# Patient Record
Sex: Female | Born: 1955
Health system: Southern US, Community
[De-identification: ages and names within clinical notes are randomized; demographics above are authoritative.]

## PROBLEM LIST (undated history)

## (undated) ENCOUNTER — Ambulatory Visit (HOSPITAL_COMMUNITY): Discharge status: Absent without leave | Payer: BC Managed Care – PPO

## (undated) DIAGNOSIS — R0789 Other chest pain: Secondary | ICD-10-CM

## (undated) DIAGNOSIS — T7840XA Allergy, unspecified, initial encounter: Secondary | ICD-10-CM

## (undated) DIAGNOSIS — M1990 Unspecified osteoarthritis, unspecified site: Secondary | ICD-10-CM

## (undated) DIAGNOSIS — H269 Unspecified cataract: Secondary | ICD-10-CM

## (undated) DIAGNOSIS — R002 Palpitations: Secondary | ICD-10-CM

## (undated) DIAGNOSIS — E785 Hyperlipidemia, unspecified: Secondary | ICD-10-CM

## (undated) DIAGNOSIS — I10 Essential (primary) hypertension: Secondary | ICD-10-CM

## (undated) DIAGNOSIS — F419 Anxiety disorder, unspecified: Secondary | ICD-10-CM

## (undated) DIAGNOSIS — J189 Pneumonia, unspecified organism: Secondary | ICD-10-CM

## (undated) DIAGNOSIS — H9319 Tinnitus, unspecified ear: Secondary | ICD-10-CM

## (undated) DIAGNOSIS — R7303 Prediabetes: Secondary | ICD-10-CM

## (undated) DIAGNOSIS — C801 Malignant (primary) neoplasm, unspecified: Secondary | ICD-10-CM

## (undated) DIAGNOSIS — K219 Gastro-esophageal reflux disease without esophagitis: Secondary | ICD-10-CM

## (undated) HISTORY — DX: Hyperlipidemia, unspecified: E78.5

## (undated) HISTORY — DX: Palpitations: R00.2

## (undated) HISTORY — DX: Tinnitus, unspecified ear: H93.19

## (undated) HISTORY — PX: JOINT REPLACEMENT: SHX530

## (undated) HISTORY — PX: TONSILLECTOMY: SUR1361

## (undated) HISTORY — DX: Malignant (primary) neoplasm, unspecified: C80.1

## (undated) HISTORY — DX: Anxiety disorder, unspecified: F41.9

## (undated) HISTORY — DX: Essential (primary) hypertension: I10

## (undated) HISTORY — DX: Gastro-esophageal reflux disease without esophagitis: K21.9

## (undated) HISTORY — PX: POLYPECTOMY: SHX149

## (undated) HISTORY — DX: Unspecified osteoarthritis, unspecified site: M19.90

## (undated) HISTORY — PX: COLONOSCOPY: SHX174

## (undated) HISTORY — PX: CERVICAL SPINE SURGERY: SHX589

## (undated) HISTORY — DX: Allergy, unspecified, initial encounter: T78.40XA

## (undated) HISTORY — PX: UPPER GASTROINTESTINAL ENDOSCOPY: SHX188

## (undated) HISTORY — DX: Other chest pain: R07.89

## (undated) HISTORY — PX: TUBAL LIGATION: SHX77

## (undated) HISTORY — PX: APPENDECTOMY: SHX54

## (undated) HISTORY — PX: BUNIONECTOMY: SHX129

## (undated) HISTORY — PX: OTHER SURGICAL HISTORY: SHX169

## (undated) HISTORY — DX: Unspecified cataract: H26.9

## (undated) HISTORY — PX: WISDOM TOOTH EXTRACTION: SHX21

## (undated) HISTORY — PX: SPINE SURGERY: SHX786

---

## 2016-01-12 ENCOUNTER — Telehealth: Payer: Self-pay | Admitting: Gastroenterology

## 2016-01-12 NOTE — Telephone Encounter (Deleted)
Patient

## 2016-01-13 ENCOUNTER — Encounter: Payer: Self-pay | Admitting: Gastroenterology

## 2016-01-13 NOTE — Telephone Encounter (Signed)
Dr. Stark reviewed records and has accepted patient. Ok to schedule Direct Colon. Colonoscopy scheduled. °

## 2016-02-25 ENCOUNTER — Encounter: Payer: Self-pay | Admitting: Gastroenterology

## 2016-02-25 ENCOUNTER — Ambulatory Visit (AMBULATORY_SURGERY_CENTER): Payer: Self-pay

## 2016-02-25 VITALS — Ht 63.0 in | Wt 162.0 lb

## 2016-02-25 DIAGNOSIS — Z860100 Personal history of colon polyps, unspecified: Secondary | ICD-10-CM

## 2016-02-25 DIAGNOSIS — Z800 Family history of malignant neoplasm of digestive organs: Secondary | ICD-10-CM

## 2016-02-25 MED ORDER — SUPREP BOWEL PREP KIT 17.5-3.13-1.6 GM/177ML PO SOLN: 1.0000 | Freq: Once | ORAL | 0 refills | Status: AC

## 2016-02-25 NOTE — Progress Notes (Signed)
No allergies to eggs or soy No past problems with anesthesia No home oxygen No diet meds  Declined emmi 

## 2016-03-10 ENCOUNTER — Ambulatory Visit (AMBULATORY_SURGERY_CENTER): Payer: BLUE CROSS/BLUE SHIELD | Admitting: Gastroenterology

## 2016-03-10 ENCOUNTER — Encounter: Payer: Self-pay | Admitting: Gastroenterology

## 2016-03-10 VITALS — BP 130/67 | HR 64 | Temp 98.0°F | Resp 15 | Ht 63.0 in | Wt 162.0 lb

## 2016-03-10 DIAGNOSIS — Z86010 Personal history of colonic polyps: Secondary | ICD-10-CM

## 2016-03-10 DIAGNOSIS — Z800 Family history of malignant neoplasm of digestive organs: Secondary | ICD-10-CM

## 2016-03-10 HISTORY — PX: COLONOSCOPY: SHX174

## 2016-03-10 MED ORDER — SODIUM CHLORIDE 0.9 % IV SOLN: 500.0000 mL | INTRAVENOUS | Status: DC

## 2016-03-10 NOTE — Progress Notes (Signed)
Report to PACU, RN, vss, BBS= Clear.  

## 2016-03-10 NOTE — Op Note (Signed)
[place] Patient Name: Caitlin Rogers Procedure Date: 03/10/2016 2:01 PM MRN: [id number] Endoscopist: Ladene Artist , MD Age: 60 Referring MD:  Date of Birth: 07-30-1955 Gender: Female Account #: [id number] Procedure:                Colonoscopy Indications:              Surveillance: Personal history of adenomatous                            polyps on last colonoscopy > 3 years ago, Colon                            cancer screening in patient at increased risk:                            Family history of colorectal cancer in 2nd degree                            relative at age 28. Medicines:                Monitored Anesthesia Care Procedure:                Pre-Anesthesia Assessment:                           - Prior to the procedure, a History and Physical                            was performed, and patient medications and                            allergies were reviewed. The patient's tolerance of                            previous anesthesia was also reviewed. The risks                            and benefits of the procedure and the sedation                            options and risks were discussed with the patient.                            All questions were answered, and informed consent                            was obtained. Prior Anticoagulants: The patient has                            taken no previous anticoagulant or antiplatelet                            agents. ASA Grade Assessment: II - A patient with  mild systemic disease. After reviewing the risks                            and benefits, the patient was deemed in                            satisfactory condition to undergo the procedure.                           After obtaining informed consent, the colonoscope                            was passed under direct vision. Throughout the                            procedure, the patient's blood pressure, pulse, and                             oxygen saturations were monitored continuously. The                            Model PCF-[id number]) scope was introduced                            through the anus and advanced to the the cecum,                            identified by appendiceal orifice and ileocecal                            valve. The ileocecal valve, appendiceal orifice,                            and rectum were photographed. The quality of the                            bowel preparation was excellent. The colonoscopy                            was performed without difficulty. The patient                            tolerated the procedure well. Scope In: 2:12:16 PM Scope Out: 2:23:22 PM Scope Withdrawal Time: 0 hours 8 minutes 13 seconds  Total Procedure Duration: 0 hours 11 minutes 6 seconds  Findings:                 Many medium-mouthed diverticula were found in the                            sigmoid colon, descending colon and transverse                            colon. There was no evidence of diverticular  bleeding.                           Internal hemorrhoids were found during                            retroflexion. The hemorrhoids were small and Grade                            I (internal hemorrhoids that do not prolapse).                           The exam was otherwise normal throughout the                            examined colon. Complications:            No immediate complications. Estimated blood loss:                            None. Estimated Blood Loss:     Estimated blood loss: none. Impression:               - Moderate diverticulosis in the sigmoid colon, in                            the descending colon and in the transverse colon.                           - Internal hemorrhoids.                           - No specimens collected. Recommendation:           - Repeat colonoscopy in 5 years for surveillance.                            - Patient has a contact number available for                            emergencies. The signs and symptoms of potential                            delayed complications were discussed with the                            patient. Return to normal activities tomorrow.                            Written discharge instructions were provided to the                            patient.                           - Continue present medications.                           -  High fiber diet indefinitely. Ladene Artist, MD 03/10/2016 2:30:12 PM This report has been signed electronically.

## 2016-03-10 NOTE — Patient Instructions (Addendum)
YOU HAD AN ENDOSCOPIC PROCEDURE TODAY AT [facility]:   Refer to the procedure report that was given to you for any specific questions about what was found during the examination.  If the procedure report does not answer your questions, please call your gastroenterologist to clarify.  If you requested that your care partner not be given the details of your procedure findings, then the procedure report has been included in a sealed envelope for you to review at your convenience later.  YOU SHOULD EXPECT: Some feelings of bloating in the abdomen. Passage of more gas than usual.  Walking can help get rid of the air that was put into your GI tract during the procedure and reduce the bloating. If you had a lower endoscopy (such as a colonoscopy or flexible sigmoidoscopy) you may notice spotting of blood in your stool or on the toilet paper. If you underwent a bowel prep for your procedure, you may not have a normal bowel movement for a few days.  Please Note:  You might notice some irritation and congestion in your nose or some drainage.  This is from the oxygen used during your procedure.  There is no need for concern and it should clear up in a day or so.  SYMPTOMS TO REPORT IMMEDIATELY:   Following lower endoscopy (colonoscopy or flexible sigmoidoscopy):  Excessive amounts of blood in the stool  Significant tenderness or worsening of abdominal pains  Swelling of the abdomen that is new, acute  Fever of 100F or higher   Following upper endoscopy (EGD)  Vomiting of blood or coffee ground material  New chest pain or pain under the shoulder blades  Painful or persistently difficult swallowing  New shortness of breath  Fever of 100F or higher  Black, tarry-looking stools  For urgent or emergent issues, a gastroenterologist can be reached at any hour by calling [phone].   DIET:  We do recommend a small meal at first, but then you may proceed to your regular diet.  Drink  plenty of fluids but you should avoid alcoholic beverages for 24 hours.  ACTIVITY:  You should plan to take it easy for the rest of today and you should NOT DRIVE or use heavy machinery until tomorrow (because of the sedation medicines used during the test).    FOLLOW UP: Our staff will call the number listed on your records the next business day following your procedure to check on you and address any questions or concerns that you may have regarding the information given to you following your procedure. If we do not reach you, we will leave a message.  However, if you are feeling well and you are not experiencing any problems, there is no need to return our call.  We will assume that you have returned to your regular daily activities without incident.  If any biopsies were taken you will be contacted by phone or by letter within the next 1-3 weeks.  Please call us at [phone] if you have not heard about the biopsies in 3 weeks.    SIGNATURES/CONFIDENTIALITY: You and/or your care partner have signed paperwork which will be entered into your electronic medical record.  These signatures attest to the fact that that the information above on your After Visit Summary has been reviewed and is understood.  Full responsibility of the confidentiality of this discharge information lies with you and/or your care-partner.  Diverticulosis, high fiber diet information, and hemorrhoid information given.[place]  Recall colonoscopy in 5 years-2022.

## 2016-03-13 ENCOUNTER — Telehealth: Payer: Self-pay

## 2016-03-13 NOTE — Telephone Encounter (Signed)
  Follow up Call-  Call back number 03/10/2016  Post procedure Call Back phone  # [phone]  Permission to leave phone message Yes     Patient questions:  Do you have a fever, pain , or abdominal swelling? No. Pain Score  0 *  Have you tolerated food without any problems? Yes.    Have you been able to return to your normal activities? Yes.    Do you have any questions about your discharge instructions: Diet   No. Medications  No. Follow up visit  No.  Do you have questions or concerns about your Care? No.  Actions: * If pain score is 4 or above: No action needed, pain <4.

## 2016-08-04 ENCOUNTER — Other Ambulatory Visit: Payer: Self-pay | Admitting: Family Medicine

## 2016-08-04 DIAGNOSIS — Z1231 Encounter for screening mammogram for malignant neoplasm of breast: Secondary | ICD-10-CM

## 2016-08-08 ENCOUNTER — Ambulatory Visit
Admission: RE | Admit: 2016-08-08 | Discharge: 2016-08-08 | Disposition: A | Discharge status: Home / Self Care | Payer: BLUE CROSS/BLUE SHIELD | Source: Ambulatory Visit | Attending: Family Medicine | Admitting: Family Medicine

## 2016-08-08 DIAGNOSIS — Z1231 Encounter for screening mammogram for malignant neoplasm of breast: Secondary | ICD-10-CM

## 2016-08-11 ENCOUNTER — Other Ambulatory Visit: Payer: Self-pay | Admitting: Family Medicine

## 2016-08-11 DIAGNOSIS — R928 Other abnormal and inconclusive findings on diagnostic imaging of breast: Secondary | ICD-10-CM

## 2016-08-18 ENCOUNTER — Ambulatory Visit
Admission: RE | Admit: 2016-08-18 | Discharge: 2016-08-18 | Disposition: A | Discharge status: Home / Self Care | Payer: BLUE CROSS/BLUE SHIELD | Source: Ambulatory Visit | Attending: Family Medicine | Admitting: Family Medicine

## 2016-08-18 DIAGNOSIS — R928 Other abnormal and inconclusive findings on diagnostic imaging of breast: Secondary | ICD-10-CM

## 2016-10-25 ENCOUNTER — Telehealth: Payer: Self-pay | Admitting: Cardiovascular Disease

## 2016-10-25 NOTE — Telephone Encounter (Signed)
Received records from Dr Rachell Cipro for appointment on 11/10/16 with Dr Oval Linsey.  Records put with Dr Blenda Mounts schedule for 11/10/16. lp

## 2016-11-10 ENCOUNTER — Encounter: Payer: Self-pay | Admitting: Cardiovascular Disease

## 2016-11-10 ENCOUNTER — Ambulatory Visit (INDEPENDENT_AMBULATORY_CARE_PROVIDER_SITE_OTHER): Payer: BLUE CROSS/BLUE SHIELD | Admitting: Cardiovascular Disease

## 2016-11-10 VITALS — BP 150/80 | HR 64 | Ht 63.0 in | Wt 170.0 lb

## 2016-11-10 DIAGNOSIS — E7800 Pure hypercholesterolemia, unspecified: Secondary | ICD-10-CM

## 2016-11-10 DIAGNOSIS — K219 Gastro-esophageal reflux disease without esophagitis: Secondary | ICD-10-CM

## 2016-11-10 DIAGNOSIS — I10 Essential (primary) hypertension: Secondary | ICD-10-CM

## 2016-11-10 DIAGNOSIS — R000 Tachycardia, unspecified: Secondary | ICD-10-CM

## 2016-11-10 DIAGNOSIS — R0789 Other chest pain: Secondary | ICD-10-CM | POA: Diagnosis not present

## 2016-11-10 DIAGNOSIS — R002 Palpitations: Secondary | ICD-10-CM

## 2016-11-10 NOTE — Progress Notes (Signed)
Cardiology Office Note   Date:  11/11/2016   ID:  Caitlin Rogers, DOB 10-14-1955, MRN [id number]  PCP:  Caitlin Cipro, MD  Cardiologist:   Caitlin Latch, MD   Chief Complaint  Patient presents with  . New Patient (Initial Visit)    tachycardia, occassional chest tightness, no shortness of breath, frequelty has edema in legs, occassional lightheaded or dizziness      History of Present Illness: Caitlin Rogers is a 61 y.o. female with hypertension, hyperlipidemia, prior tobacco abuse, and gastroesophageal reflux disease who is being seen today for the evaluation of palpitations at the request of Caitlin Bien, MD.  Caitlin Rogers saw Caitlin Rogers on 10/10/16. She reported an episode of palpitations with a heart rate in the 160s.  She recently moved here from [place]. She established care with Caitlin Rogers soon after moving here and her blood pressure and cholesterol were both elevated.  Her metoprolol was discontinued and she was started on losartan.   The episode occurred soon after stopping metoprolol and lasted for several hours.  It was associated with shortness of breath but no chest pain. She had been taking metoprolol for many years after she had an episode of palpitations while being treated for H pylori.   She does report sporadic episodes of chest tightness that has been occuring for the last month.  She thinks it may be related to the pollen.  There is no associated shortness of breath or nausea but she does get diaphoretic.  Caitlin Rogers reports having a heart catheterization several years ago that reportedly showed small coronary arteries but no plaque.    Prior to moving to [facility], Caitlin Rogers was working out regularly with a Clinical research associate but hasn't started back exercising regularly since.  She occasionally uses the treadmill and does a lot of house work without chest pain or tightness.  She notes mild exertional yshortness of breath.  She denies lower extremity edema, orthopnea or PND.  She  brings a log of her blood pressure that shows it has been in the 545G-256L systolic.  She was previously on cholesterol medication but stopped after losing weight.  However recently she has gained 15 lb.  Caitlin Rogers' mother died of sudden cardiac death at age 41.     Past Medical History:  Diagnosis Date  . Anxiety   . Arthritis   . Atypical chest pain 11/11/2016  . Essential hypertension 11/11/2016  . GERD (gastroesophageal reflux disease)   . GERD (gastroesophageal reflux disease) 11/11/2016  . Hyperlipemia   . Hyperlipidemia 11/11/2016  . Hypertension   . Palpitations 11/11/2016    Past Surgical History:  Procedure Laterality Date  . APPENDECTOMY    . BUNIONECTOMY     bilat  . CERVICAL SPINE SURGERY    . CESAREAN SECTION     in toe  . TONSILLECTOMY    . TUBAL LIGATION    . WISDOM TOOTH EXTRACTION       Current Outpatient Prescriptions  Medication Sig Dispense Refill  . lansoprazole (PREVACID) 30 MG capsule Take 30 mg by mouth daily at 12 noon.    Marland Kitchen losartan (COZAAR) 50 MG tablet Take 1 tablet by mouth daily.    . metoprolol succinate (TOPROL-XL) 25 MG 24 hr tablet Take 25 mg by mouth daily.     Current Facility-Administered Medications  Medication Dose Route Frequency Provider Last Rate Last Dose  . 0.9 %  sodium chloride infusion  500 mL Intravenous Continuous Caitlin Artist, MD  Allergies:   Patient has no known allergies.    Social History:  The patient  reports that she has quit smoking. She has never used smokeless tobacco. She reports that she drinks about 1.8 oz of alcohol per week . She reports that she does not use drugs.   Family History:  The patient's family history includes CAD in her brother; Colon cancer in her paternal aunt; Diabetes in her mother; Diabetes Mellitus II in her mother; Heart attack in her paternal grandfather; Heart disease in her father; Hypertension in her father and mother; Stroke in her brother and father.    ROS:  Please see  the history of present illness.   Otherwise, review of systems are positive for none.   All other systems are reviewed and negative.    PHYSICAL EXAM: VS:  BP (!) 150/80 (BP Location: Right Arm, Patient Position: Sitting, Cuff Size: Normal)   Pulse 64   Ht 5\' 3"  (1.6 m)   Wt 77.1 kg (170 lb)   BMI 30.11 kg/m  , BMI Body mass index is 30.11 kg/m. GENERAL:  Well appearing HEENT:  Pupils equal round and reactive, fundi not visualized, oral mucosa unremarkable NECK:  No jugular venous distention, waveform within normal limits, carotid upstroke brisk and symmetric, no bruits, no thyromegaly LYMPHATICS:  No cervical adenopathy LUNGS:  Clear to auscultation bilaterally HEART:  RRR.  PMI not displaced or sustained,S1 and S2 within normal limits, no S3, no S4, no clicks, no rubs, no murmurs ABD:  Flat, positive bowel sounds normal in frequency in pitch, no bruits, no rebound, no guarding, no midline pulsatile mass, no hepatomegaly, no splenomegaly EXT:  2 plus pulses throughout, no edema, no cyanosis no clubbing SKIN:  No rashes no nodules NEURO:  Cranial nerves II through XII grossly intact, motor grossly intact throughout PSYCH:  Cognitively intact, oriented to person place and time    EKG:  EKG is ordered today. The ekg ordered today demonstrates sinus rhythm. Rate 64 bpm.   Recent Labs: No results found for requested labs within last 8760 hours.  07/29/16: WBC 3.2, hemoglobin 13.3, hematocrit 39.4, platelets 214 Sodium 145, potassium 4.6, BUN 11, creatinine 0.58 AST 27, ALT 17 TSH 2.37 Total cholesterol 266, triglycerides 256, HDL 71, LDL 144  Lipid Panel No results found for: CHOL, TRIG, HDL, CHOLHDL, VLDL, LDLCALC, LDLDIRECT    Wt Readings from Last 3 Encounters:  11/10/16 77.1 kg (170 lb)  03/10/16 73.5 kg (162 lb)  02/25/16 73.5 kg (162 lb)      ASSESSMENT AND PLAN:  # Palpitations: It is unclear what caused Caitlin Rogers' palpitations.  They were well-controlled until  she stopped metoprolol.  It is unpredictable and she only had one episode, so we will not give her a monitor.  Resume metoprolol succinate 25 mg daily.  # Hypertension: Blood pressure has been mostly controlled at home but occasionally elevated.  It is elevated today in clinic.  Resume daily metoprolol as above.  Continue losartan.  # Hyperlipidemia: ASCVD 10 year risk is 5.4%.  We discussed the importance of regular exercise and limiting fried foods and fatty foods.   # Atypical chest pain:  Symptoms are atypical but she has risk factors.  We will get an ETT to evaluate for ischemia.  Current medicines are reviewed at length with the patient today.  The patient does not have concerns regarding medicines.  The following changes have been made:  Restart metoprolol 25 mg daily.    Labs/  tests ordered today include:   Orders Placed This Encounter  Procedures  . Exercise Tolerance Test  . EKG 12-Lead     Disposition:   FU with Anely Spiewak C. Oval Linsey, MD, [facility] in 1 year.   This note was written with the assistance of speech recognition software.  Please excuse any transcriptional errors.  Signed, Somalia Segler C. Oval Linsey, MD, [facility]  11/11/2016 11:38 PM    [place]

## 2016-11-10 NOTE — Patient Instructions (Addendum)
Medication Instructions:  START TAKING YOUR METOPROLOL 25 MG DAILY   Labwork: NONE  Testing/Procedures: Your physician has requested that you have an exercise tolerance test. For further information please visit HugeFiesta[employer][email or web address]. Please also follow instruction sheet, as given. DO NOT TAKE YOUR METOPROLOL MORNING BEFORE OR MORNING OF EXERCISE TEST    Follow-Up: Your physician wants you to follow-up in: [place] will receive a reminder letter in the mail two months in advance. If you don't receive a letter, please call our office to schedule the follow-up appointment.  If you need a refill on your cardiac medications before your next appointment, please call your pharmacy.

## 2016-11-11 ENCOUNTER — Encounter: Payer: Self-pay | Admitting: Cardiovascular Disease

## 2016-11-11 DIAGNOSIS — E785 Hyperlipidemia, unspecified: Secondary | ICD-10-CM

## 2016-11-11 DIAGNOSIS — K219 Gastro-esophageal reflux disease without esophagitis: Secondary | ICD-10-CM | POA: Insufficient documentation

## 2016-11-11 DIAGNOSIS — R0789 Other chest pain: Secondary | ICD-10-CM

## 2016-11-11 DIAGNOSIS — I10 Essential (primary) hypertension: Secondary | ICD-10-CM

## 2016-11-11 DIAGNOSIS — R002 Palpitations: Secondary | ICD-10-CM | POA: Insufficient documentation

## 2016-11-11 HISTORY — DX: Essential (primary) hypertension: I10

## 2016-11-11 HISTORY — DX: Hyperlipidemia, unspecified: E78.5

## 2016-11-11 HISTORY — DX: Palpitations: R00.2

## 2016-11-11 HISTORY — DX: Other chest pain: R07.89

## 2016-11-11 HISTORY — DX: Gastro-esophageal reflux disease without esophagitis: K21.9

## 2016-11-24 ENCOUNTER — Inpatient Hospital Stay (HOSPITAL_COMMUNITY): Admission: RE | Admit: 2016-11-24 | Payer: BLUE CROSS/BLUE SHIELD | Source: Ambulatory Visit

## 2016-12-27 ENCOUNTER — Telehealth (HOSPITAL_COMMUNITY): Payer: Self-pay

## 2016-12-27 NOTE — Telephone Encounter (Signed)
I will call pt back closer to her scheduled appointment.  Encounter complete.

## 2016-12-29 ENCOUNTER — Inpatient Hospital Stay (HOSPITAL_COMMUNITY): Admission: RE | Admit: 2016-12-29 | Payer: BLUE CROSS/BLUE SHIELD | Source: Ambulatory Visit

## 2017-01-17 ENCOUNTER — Telehealth (HOSPITAL_COMMUNITY): Payer: Self-pay

## 2017-01-17 NOTE — Telephone Encounter (Signed)
Encounter complete. 

## 2017-01-18 ENCOUNTER — Telehealth (HOSPITAL_COMMUNITY): Payer: Self-pay

## 2017-01-18 NOTE — Telephone Encounter (Signed)
Encounter complete. 

## 2017-01-19 ENCOUNTER — Ambulatory Visit (HOSPITAL_COMMUNITY)
Admission: RE | Admit: 2017-01-19 | Discharge: 2017-01-19 | Disposition: A | Discharge status: Home / Self Care | Payer: BLUE CROSS/BLUE SHIELD | Source: Ambulatory Visit | Attending: Cardiology | Admitting: Cardiology

## 2017-01-19 DIAGNOSIS — R000 Tachycardia, unspecified: Secondary | ICD-10-CM

## 2017-01-19 LAB — EXERCISE TOLERANCE TEST
CSEPED: 9 min
CSEPEW: 10.2 METS
CSEPPHR: 155 {beats}/min
Exercise duration (sec): 5 s
MPHR: 161 {beats}/min
Percent HR: 96 %
RPE: 17
Rest HR: 71 {beats}/min

## 2017-01-22 ENCOUNTER — Telehealth: Payer: Self-pay | Admitting: Cardiovascular Disease

## 2017-01-22 NOTE — Telephone Encounter (Signed)
New Message  Pt returning RN call about ETT results. Please call back to discuss

## 2017-01-22 NOTE — Telephone Encounter (Signed)
Advised patient of results   Notes recorded by Skeet Latch, MD on 01/19/2017 at 6:16 PM EDT Normal stress test.

## 2017-07-24 HISTORY — PX: MOUTH SURGERY: SHX715

## 2017-08-07 DIAGNOSIS — Z1329 Encounter for screening for other suspected endocrine disorder: Secondary | ICD-10-CM | POA: Diagnosis not present

## 2017-08-07 DIAGNOSIS — Z139 Encounter for screening, unspecified: Secondary | ICD-10-CM | POA: Diagnosis not present

## 2017-08-07 DIAGNOSIS — Z13220 Encounter for screening for lipoid disorders: Secondary | ICD-10-CM | POA: Diagnosis not present

## 2017-08-07 DIAGNOSIS — Z0000 Encounter for general adult medical examination without abnormal findings: Secondary | ICD-10-CM | POA: Diagnosis not present

## 2017-08-07 DIAGNOSIS — Z114 Encounter for screening for human immunodeficiency virus [HIV]: Secondary | ICD-10-CM | POA: Diagnosis not present

## 2017-08-17 ENCOUNTER — Other Ambulatory Visit: Payer: Self-pay | Admitting: Family Medicine

## 2017-08-17 DIAGNOSIS — Z0000 Encounter for general adult medical examination without abnormal findings: Secondary | ICD-10-CM | POA: Diagnosis not present

## 2017-08-17 DIAGNOSIS — Z1211 Encounter for screening for malignant neoplasm of colon: Secondary | ICD-10-CM | POA: Diagnosis not present

## 2017-08-17 DIAGNOSIS — E782 Mixed hyperlipidemia: Secondary | ICD-10-CM | POA: Diagnosis not present

## 2017-08-17 DIAGNOSIS — Z6831 Body mass index (BMI) 31.0-31.9, adult: Secondary | ICD-10-CM | POA: Diagnosis not present

## 2017-08-17 DIAGNOSIS — Z1231 Encounter for screening mammogram for malignant neoplasm of breast: Secondary | ICD-10-CM

## 2017-08-17 DIAGNOSIS — K219 Gastro-esophageal reflux disease without esophagitis: Secondary | ICD-10-CM | POA: Diagnosis not present

## 2017-08-17 DIAGNOSIS — R000 Tachycardia, unspecified: Secondary | ICD-10-CM | POA: Diagnosis not present

## 2017-08-17 DIAGNOSIS — I10 Essential (primary) hypertension: Secondary | ICD-10-CM | POA: Diagnosis not present

## 2017-09-03 DIAGNOSIS — H5213 Myopia, bilateral: Secondary | ICD-10-CM | POA: Diagnosis not present

## 2017-09-03 DIAGNOSIS — H2513 Age-related nuclear cataract, bilateral: Secondary | ICD-10-CM | POA: Diagnosis not present

## 2017-09-05 ENCOUNTER — Ambulatory Visit
Admission: RE | Admit: 2017-09-05 | Discharge: 2017-09-05 | Disposition: A | Discharge status: Home / Self Care | Payer: BLUE CROSS/BLUE SHIELD | Source: Ambulatory Visit | Attending: Family Medicine | Admitting: Family Medicine

## 2017-09-05 DIAGNOSIS — Z1231 Encounter for screening mammogram for malignant neoplasm of breast: Secondary | ICD-10-CM | POA: Diagnosis not present

## 2017-09-06 DIAGNOSIS — I10 Essential (primary) hypertension: Secondary | ICD-10-CM | POA: Diagnosis not present

## 2017-09-06 DIAGNOSIS — Z6831 Body mass index (BMI) 31.0-31.9, adult: Secondary | ICD-10-CM | POA: Diagnosis not present

## 2017-10-15 DIAGNOSIS — Z6831 Body mass index (BMI) 31.0-31.9, adult: Secondary | ICD-10-CM | POA: Diagnosis not present

## 2017-10-15 DIAGNOSIS — H9313 Tinnitus, bilateral: Secondary | ICD-10-CM | POA: Diagnosis not present

## 2017-10-15 DIAGNOSIS — I10 Essential (primary) hypertension: Secondary | ICD-10-CM | POA: Diagnosis not present

## 2017-10-15 DIAGNOSIS — E782 Mixed hyperlipidemia: Secondary | ICD-10-CM | POA: Diagnosis not present

## 2017-11-05 DIAGNOSIS — I10 Essential (primary) hypertension: Secondary | ICD-10-CM | POA: Diagnosis not present

## 2017-11-05 DIAGNOSIS — J309 Allergic rhinitis, unspecified: Secondary | ICD-10-CM | POA: Diagnosis not present

## 2017-11-05 DIAGNOSIS — K219 Gastro-esophageal reflux disease without esophagitis: Secondary | ICD-10-CM | POA: Diagnosis not present

## 2017-11-05 DIAGNOSIS — H9313 Tinnitus, bilateral: Secondary | ICD-10-CM | POA: Diagnosis not present

## 2018-02-08 DIAGNOSIS — K219 Gastro-esophageal reflux disease without esophagitis: Secondary | ICD-10-CM | POA: Diagnosis not present

## 2018-02-08 DIAGNOSIS — Z6830 Body mass index (BMI) 30.0-30.9, adult: Secondary | ICD-10-CM | POA: Diagnosis not present

## 2018-02-08 DIAGNOSIS — E7800 Pure hypercholesterolemia, unspecified: Secondary | ICD-10-CM | POA: Diagnosis not present

## 2018-02-08 DIAGNOSIS — I10 Essential (primary) hypertension: Secondary | ICD-10-CM | POA: Diagnosis not present

## 2018-04-12 DIAGNOSIS — Z23 Encounter for immunization: Secondary | ICD-10-CM | POA: Diagnosis not present

## 2018-04-12 DIAGNOSIS — K219 Gastro-esophageal reflux disease without esophagitis: Secondary | ICD-10-CM | POA: Diagnosis not present

## 2018-04-12 DIAGNOSIS — I10 Essential (primary) hypertension: Secondary | ICD-10-CM | POA: Diagnosis not present

## 2018-04-12 DIAGNOSIS — Z6829 Body mass index (BMI) 29.0-29.9, adult: Secondary | ICD-10-CM | POA: Diagnosis not present

## 2018-04-12 DIAGNOSIS — E782 Mixed hyperlipidemia: Secondary | ICD-10-CM | POA: Diagnosis not present

## 2018-05-14 DIAGNOSIS — H9313 Tinnitus, bilateral: Secondary | ICD-10-CM | POA: Diagnosis not present

## 2018-05-14 DIAGNOSIS — Z6829 Body mass index (BMI) 29.0-29.9, adult: Secondary | ICD-10-CM | POA: Diagnosis not present

## 2018-05-14 DIAGNOSIS — K219 Gastro-esophageal reflux disease without esophagitis: Secondary | ICD-10-CM | POA: Diagnosis not present

## 2018-05-14 DIAGNOSIS — J309 Allergic rhinitis, unspecified: Secondary | ICD-10-CM | POA: Diagnosis not present

## 2018-05-14 DIAGNOSIS — Z23 Encounter for immunization: Secondary | ICD-10-CM | POA: Diagnosis not present

## 2018-08-15 DIAGNOSIS — K253 Acute gastric ulcer without hemorrhage or perforation: Secondary | ICD-10-CM | POA: Diagnosis not present

## 2018-08-15 DIAGNOSIS — K210 Gastro-esophageal reflux disease with esophagitis: Secondary | ICD-10-CM | POA: Diagnosis not present

## 2018-08-15 DIAGNOSIS — K920 Hematemesis: Secondary | ICD-10-CM | POA: Diagnosis not present

## 2018-08-15 DIAGNOSIS — Z6829 Body mass index (BMI) 29.0-29.9, adult: Secondary | ICD-10-CM | POA: Diagnosis not present

## 2018-08-21 DIAGNOSIS — Z0000 Encounter for general adult medical examination without abnormal findings: Secondary | ICD-10-CM | POA: Diagnosis not present

## 2018-08-21 DIAGNOSIS — Z13220 Encounter for screening for lipoid disorders: Secondary | ICD-10-CM | POA: Diagnosis not present

## 2018-08-21 DIAGNOSIS — Z1329 Encounter for screening for other suspected endocrine disorder: Secondary | ICD-10-CM | POA: Diagnosis not present

## 2018-08-21 DIAGNOSIS — Z114 Encounter for screening for human immunodeficiency virus [HIV]: Secondary | ICD-10-CM | POA: Diagnosis not present

## 2018-08-21 DIAGNOSIS — E559 Vitamin D deficiency, unspecified: Secondary | ICD-10-CM | POA: Diagnosis not present

## 2018-08-23 DIAGNOSIS — Z0000 Encounter for general adult medical examination without abnormal findings: Secondary | ICD-10-CM | POA: Diagnosis not present

## 2018-08-23 DIAGNOSIS — Z6829 Body mass index (BMI) 29.0-29.9, adult: Secondary | ICD-10-CM | POA: Diagnosis not present

## 2018-08-23 DIAGNOSIS — Z1211 Encounter for screening for malignant neoplasm of colon: Secondary | ICD-10-CM | POA: Diagnosis not present

## 2018-09-05 ENCOUNTER — Ambulatory Visit (INDEPENDENT_AMBULATORY_CARE_PROVIDER_SITE_OTHER): Payer: BLUE CROSS/BLUE SHIELD | Admitting: Gastroenterology

## 2018-09-05 ENCOUNTER — Encounter: Payer: Self-pay | Admitting: Gastroenterology

## 2018-09-05 VITALS — BP 122/68 | HR 76 | Ht 63.0 in | Wt 162.0 lb

## 2018-09-05 DIAGNOSIS — R0989 Other specified symptoms and signs involving the circulatory and respiratory systems: Secondary | ICD-10-CM

## 2018-09-05 DIAGNOSIS — K219 Gastro-esophageal reflux disease without esophagitis: Secondary | ICD-10-CM | POA: Diagnosis not present

## 2018-09-05 DIAGNOSIS — K920 Hematemesis: Secondary | ICD-10-CM

## 2018-09-05 MED ORDER — OMEPRAZOLE 40 MG PO CPDR: 40.0000 mg | DELAYED_RELEASE_CAPSULE | Freq: Every day | ORAL | 11 refills | Status: DC

## 2018-09-05 NOTE — Patient Instructions (Signed)
We have sent the following medications to your pharmacy for you to pick up at your convenience:omeprazole.  Patient advised to avoid spicy, acidic, citrus, chocolate, mints, fruit and fruit juices.  Limit the intake of caffeine, alcohol and Soda.  Don't exercise too soon after eating.  Don't lie down within 3-4 hours of eating.  Elevate the head of your bed.   You have been scheduled for an endoscopy. Please follow written instructions given to you at your visit today. If you use inhalers (even only as needed), please bring them with you on the day of your procedure. Your physician has requested that you go to www.startemmi.com and enter the access code given to you at your visit today. This web site gives a general overview about your procedure. However, you should still follow specific instructions given to you by our office regarding your preparation for the procedure.  Thank you for choosing me and [facility].  Malcolm T. Stark, Jr., MD., FACG  

## 2018-09-05 NOTE — Progress Notes (Addendum)
History of Present Illness: This is a 63 year old female referred by Fanny Bien, MD for the evaluation of GERD, vomiting, hematemesis, globus.  She has had chronic GERD and previous endoscopy performed in [place] as outlined below.  Several months ago she was taken off PPI therapy and placed on H2 blocker therapy and her symptoms have been very active since with frequent heartburn, nocturnal regurgitation and vomiting of a small volume of coffee-ground material on a few occasions.  She has a sensation of something stuck in her throat but relates no difficulty swallowing any foods or liquids. Denies weight loss, abdominal pain, constipation, diarrhea, change in stool caliber, melena, hematochezia, dysphagia, chest pain.   EGD 12/2012 small HH, irregular z line, 3  gastric polyps. Biopsies negative for H pylori, negative for Barrett's. Fundic gland polyps, chronic gastritis with focal intestinal metaplasia noted.   No Known Allergies Outpatient Medications Prior to Visit  Medication Sig Dispense Refill  . cimetidine (TAGAMET) 800 MG tablet Take 800 mg by mouth 2 (two) times daily.    . metoprolol tartrate (LOPRESSOR) 25 MG tablet Take 25 mg by mouth daily.    . rosuvastatin (CRESTOR) 5 MG tablet Take 5 mg by mouth at bedtime.    . thiamine (VITAMIN B-1) 100 MG tablet Take 100 mg by mouth as needed.    . lansoprazole (PREVACID) 30 MG capsule Take 30 mg by mouth daily at 12 noon.    Marland Kitchen losartan (COZAAR) 50 MG tablet Take 1 tablet by mouth daily.    . metoprolol succinate (TOPROL-XL) 25 MG 24 hr tablet Take 25 mg by mouth daily.    Marland Kitchen 0.9 %  sodium chloride infusion      No facility-administered medications prior to visit.    Past Medical History:  Diagnosis Date  . Anxiety   . Arthritis   . Atypical chest pain 11/11/2016  . Essential hypertension 11/11/2016  . GERD (gastroesophageal reflux disease)   . GERD (gastroesophageal reflux disease) 11/11/2016  . Hyperlipemia   .  Hyperlipidemia 11/11/2016  . Hypertension   . Palpitations 11/11/2016   Past Surgical History:  Procedure Laterality Date  . APPENDECTOMY    . BUNIONECTOMY     bilat  . CERVICAL SPINE SURGERY    . CESAREAN SECTION     in toe  . MOUTH SURGERY  2019   implant with crown  . TONSILLECTOMY    . TUBAL LIGATION    . WISDOM TOOTH EXTRACTION     Social History   Socioeconomic History  . Marital status: Married    Spouse name: Not on file  . Number of children: Not on file  . Years of education: Not on file  . Highest education level: Not on file  Occupational History  . Not on file  Social Needs  . Financial resource strain: Not on file  . Food insecurity:    Worry: Not on file    Inability: Not on file  . Transportation needs:    Medical: Not on file    Non-medical: Not on file  Tobacco Use  . Smoking status: Former Research scientist (life sciences)  . Smokeless tobacco: Never Used  Substance and Sexual Activity  . Alcohol use: Yes    Alcohol/week: 3.0 standard drinks    Types: 3 Glasses of wine per week    Comment: and some liquor  . Drug use: No  . Sexual activity: Not on file  Lifestyle  . Physical activity:  Days per week: Not on file    Minutes per session: Not on file  . Stress: Not on file  Relationships  . Social connections:    Talks on phone: Not on file    Gets together: Not on file    Attends religious service: Not on file    Active member of club or organization: Not on file    Attends meetings of clubs or organizations: Not on file    Relationship status: Not on file  Other Topics Concern  . Not on file  Social History Narrative  . Not on file   Family History  Problem Relation Age of Onset  . Colon cancer Paternal Aunt   . Hypertension Mother   . Diabetes Mother   . Diabetes Mellitus II Mother   . Hypertension Father   . Stroke Father   . Heart disease Father   . Stroke Brother   . Heart attack Paternal Grandfather   . CAD Brother      Review of Systems:  Pertinent positive and negative review of systems were noted in the above HPI section. All other review of systems were otherwise negative.    Physical Exam: General: Well developed, well nourished, no acute distress Head: Normocephalic and atraumatic Eyes:  sclerae anicteric, EOMI Ears: Normal auditory acuity Mouth: No deformity or lesions Neck: Supple, no masses or thyromegaly Lungs: Clear throughout to auscultation Heart: Regular rate and rhythm; no murmurs, rubs or bruits Abdomen: Soft, non tender and non distended. No masses, hepatosplenomegaly or hernias noted. Normal Bowel sounds Rectal: Not done Musculoskeletal: Symmetrical with no gross deformities  Skin: No lesions on visible extremities Pulses:  Normal pulses noted Extremities: No clubbing, cyanosis, edema or deformities noted Neurological: Alert oriented x 4, grossly nonfocal Cervical Nodes:  No significant cervical adenopathy Inguinal Nodes: No significant inguinal adenopathy Psychological:  Alert and cooperative. Normal mood and affect   Assessment and Recommendations:  1. Active GERD symptoms, hematemesis, globus sensation and history of H pylori gastritis.  Rule out esophagitis, ulcer.  Discontinue H2 blockers and begin omeprazole 40 mg every morning and if not covered then an equivalent dose PPI.  Schedule EGD. The risks (including bleeding, perforation, infection, missed lesions, medication reactions and possible hospitalization or surgery if complications occur), benefits, and alternatives to endoscopy with possible biopsy and possible dilation were discussed with the patient and they consent to proceed.   2. Personal history of adenomatous colon polyps. A 5 year interval surveillance colonoscopy is due in 02/2021.     cc: Fanny Bien, [address] [place], [place] [address]

## 2018-09-27 ENCOUNTER — Other Ambulatory Visit: Payer: Self-pay

## 2018-09-27 ENCOUNTER — Ambulatory Visit (AMBULATORY_SURGERY_CENTER): Payer: BLUE CROSS/BLUE SHIELD | Admitting: Gastroenterology

## 2018-09-27 ENCOUNTER — Encounter: Payer: Self-pay | Admitting: Gastroenterology

## 2018-09-27 VITALS — BP 136/69 | HR 86 | Temp 98.4°F | Resp 20 | Ht 63.0 in | Wt 162.0 lb

## 2018-09-27 DIAGNOSIS — K210 Gastro-esophageal reflux disease with esophagitis: Secondary | ICD-10-CM

## 2018-09-27 DIAGNOSIS — K920 Hematemesis: Secondary | ICD-10-CM

## 2018-09-27 DIAGNOSIS — K449 Diaphragmatic hernia without obstruction or gangrene: Secondary | ICD-10-CM

## 2018-09-27 DIAGNOSIS — K317 Polyp of stomach and duodenum: Secondary | ICD-10-CM

## 2018-09-27 DIAGNOSIS — K219 Gastro-esophageal reflux disease without esophagitis: Secondary | ICD-10-CM

## 2018-09-27 MED ORDER — SODIUM CHLORIDE 0.9 % IV SOLN: 500.0000 mL | Freq: Once | INTRAVENOUS | Status: DC

## 2018-09-27 NOTE — Progress Notes (Signed)
To PACU, VSS. Report to RN.tb 

## 2018-09-27 NOTE — Op Note (Signed)
[facility] Patient Name: Caitlin Rogers Procedure Date: 09/27/2018 1:29 PM MRN: [id number] Endoscopist: Ladene Artist , MD Age: 63 Referring MD:  Date of Birth: October 02, 1955 Gender: Female Account #: [id number] Procedure:                Upper GI endoscopy Indications:              Gastroesophageal reflux disease, Hematemesis Medicines:                Monitored Anesthesia Care Procedure:                Pre-Anesthesia Assessment:                           - Prior to the procedure, a History and Physical                            was performed, and patient medications and                            allergies were reviewed. The patient's tolerance of                            previous anesthesia was also reviewed. The risks                            and benefits of the procedure and the sedation                            options and risks were discussed with the patient.                            All questions were answered, and informed consent                            was obtained. Prior Anticoagulants: The patient has                            taken no previous anticoagulant or antiplatelet                            agents. ASA Grade Assessment: II - A patient with                            mild systemic disease. After reviewing the risks                            and benefits, the patient was deemed in                            satisfactory condition to undergo the procedure.                           After obtaining informed consent, the endoscope was  passed under direct vision. Throughout the                            procedure, the patient's blood pressure, pulse, and                            oxygen saturations were monitored continuously. The                            Model GIF-HQ190 [id number]) scope was introduced                            through the mouth, and advanced to the second part                            of  duodenum. The upper GI endoscopy was                            accomplished without difficulty. The patient                            tolerated the procedure well. Scope In: Scope Out: Findings:                 LA Grade B (one or more mucosal breaks greater than                            5 mm, not extending between the tops of two mucosal                            folds) esophagitis with no bleeding was found in                            the distal esophagus.                           The exam of the esophagus was otherwise normal.                           A medium-sized hiatal hernia was present.                           A few 3 to 4 mm sessile polyps with no bleeding and                            no stigmata of recent bleeding were found in the                            gastric fundus and in the gastric body. Biopsies                            were taken with a cold forceps for histology.  The exam of the stomach was otherwise normal.                           The duodenal bulb and second portion of the                            duodenum were normal. Complications:            No immediate complications. Estimated Blood Loss:     Estimated blood loss was minimal. Impression:               - LA Grade B reflux esophagitis.                           - Medium-sized hiatal hernia.                           - A few gastric polyps. Biopsied.                           - Normal duodenal bulb and second portion of the                            duodenum. Recommendation:           - Patient has a contact number available for                            emergencies. The signs and symptoms of potential                            delayed complications were discussed with the                            patient. Return to normal activities tomorrow.                            Written discharge instructions were provided to the                            patient.                            - Resume previous diet.                           - Antireflux measures long term.                           - Continue present medications.                           - Await pathology results.                           - Return to GI office in 1 year. Ladene Artist, MD 09/27/2018 1:57:23 PM This report has been signed electronically.

## 2018-09-27 NOTE — Progress Notes (Signed)
Called to room to assist during endoscopic procedure.  Patient ID and intended procedure confirmed with present staff. Received instructions for my participation in the procedure from the performing physician.  

## 2018-09-27 NOTE — Patient Instructions (Signed)
Information on esophagitis and gastritis given to you today.  Await pathology results.  YOU HAD AN ENDOSCOPIC PROCEDURE TODAY AT [facility]:   Refer to the procedure report that was given to you for any specific questions about what was found during the examination.  If the procedure report does not answer your questions, please call your gastroenterologist to clarify.  If you requested that your care partner not be given the details of your procedure findings, then the procedure report has been included in a sealed envelope for you to review at your convenience later.  YOU SHOULD EXPECT: Some feelings of bloating in the abdomen. Passage of more gas than usual.  Walking can help get rid of the air that was put into your GI tract during the procedure and reduce the bloating. If you had a lower endoscopy (such as a colonoscopy or flexible sigmoidoscopy) you may notice spotting of blood in your stool or on the toilet paper. If you underwent a bowel prep for your procedure, you may not have a normal bowel movement for a few days.  Please Note:  You might notice some irritation and congestion in your nose or some drainage.  This is from the oxygen used during your procedure.  There is no need for concern and it should clear up in a day or so.  SYMPTOMS TO REPORT IMMEDIATELY:   Following upper endoscopy (EGD)  Vomiting of blood or coffee ground material  New chest pain or pain under the shoulder blades  Painful or persistently difficult swallowing  New shortness of breath  Fever of 100F or higher  Black, tarry-looking stools  For urgent or emergent issues, a gastroenterologist can be reached at any hour by calling [phone].   DIET:  We do recommend a small meal at first, but then you may proceed to your regular diet.  Drink plenty of fluids but you should avoid alcoholic beverages for 24 hours.  ACTIVITY:  You should plan to take it easy for the rest of today and you  should NOT DRIVE or use heavy machinery until tomorrow (because of the sedation medicines used during the test).    FOLLOW UP: Our staff will call the number listed on your records the next business day following your procedure to check on you and address any questions or concerns that you may have regarding the information given to you following your procedure. If we do not reach you, we will leave a message.  However, if you are feeling well and you are not experiencing any problems, there is no need to return our call.  We will assume that you have returned to your regular daily activities without incident.  If any biopsies were taken you will be contacted by phone or by letter within the next 1-3 weeks.  Please call us at [phone] if you have not heard about the biopsies in 3 weeks.    SIGNATURES/CONFIDENTIALITY: You and/or your care partner have signed paperwork which will be entered into your electronic medical record.  These signatures attest to the fact that that the information above on your After Visit Summary has been reviewed and is understood.  Full responsibility of the confidentiality of this discharge information lies with you and/or your care-partner.

## 2018-09-30 ENCOUNTER — Telehealth: Payer: Self-pay | Admitting: *Deleted

## 2018-09-30 NOTE — Telephone Encounter (Signed)
  Follow up Call-  Call back number 09/27/2018 03/10/2016  Post procedure Call Back phone  # [phone]  Permission to leave phone message Yes Yes  Some recent data might be hidden     Patient questions:  Do you have a fever, pain , or abdominal swelling? No. Pain Score  0 *  Have you tolerated food without any problems? Yes.    Have you been able to return to your normal activities? Yes.    Do you have any questions about your discharge instructions: Diet   No. Medications  No. Follow up visit  No.  Do you have questions or concerns about your Care? No.  Actions: * If pain score is 4 or above: No action needed, pain <4.

## 2018-10-06 ENCOUNTER — Encounter: Payer: Self-pay | Admitting: Gastroenterology

## 2019-01-16 DIAGNOSIS — I10 Essential (primary) hypertension: Secondary | ICD-10-CM | POA: Diagnosis not present

## 2019-01-16 DIAGNOSIS — F411 Generalized anxiety disorder: Secondary | ICD-10-CM | POA: Diagnosis not present

## 2019-01-16 DIAGNOSIS — R635 Abnormal weight gain: Secondary | ICD-10-CM | POA: Diagnosis not present

## 2019-01-16 DIAGNOSIS — Z6831 Body mass index (BMI) 31.0-31.9, adult: Secondary | ICD-10-CM | POA: Diagnosis not present

## 2019-01-17 DIAGNOSIS — F410 Panic disorder [episodic paroxysmal anxiety] without agoraphobia: Secondary | ICD-10-CM | POA: Diagnosis not present

## 2019-01-17 DIAGNOSIS — I10 Essential (primary) hypertension: Secondary | ICD-10-CM | POA: Diagnosis not present

## 2019-01-17 DIAGNOSIS — F411 Generalized anxiety disorder: Secondary | ICD-10-CM | POA: Diagnosis not present

## 2019-01-23 DIAGNOSIS — F331 Major depressive disorder, recurrent, moderate: Secondary | ICD-10-CM | POA: Diagnosis not present

## 2019-01-23 DIAGNOSIS — I10 Essential (primary) hypertension: Secondary | ICD-10-CM | POA: Diagnosis not present

## 2019-01-23 DIAGNOSIS — F411 Generalized anxiety disorder: Secondary | ICD-10-CM | POA: Diagnosis not present

## 2019-02-14 DIAGNOSIS — F411 Generalized anxiety disorder: Secondary | ICD-10-CM | POA: Diagnosis not present

## 2019-02-14 DIAGNOSIS — F410 Panic disorder [episodic paroxysmal anxiety] without agoraphobia: Secondary | ICD-10-CM | POA: Diagnosis not present

## 2019-02-14 DIAGNOSIS — I10 Essential (primary) hypertension: Secondary | ICD-10-CM | POA: Diagnosis not present

## 2019-02-14 DIAGNOSIS — F331 Major depressive disorder, recurrent, moderate: Secondary | ICD-10-CM | POA: Diagnosis not present

## 2019-03-21 DIAGNOSIS — F411 Generalized anxiety disorder: Secondary | ICD-10-CM | POA: Diagnosis not present

## 2019-03-21 DIAGNOSIS — R000 Tachycardia, unspecified: Secondary | ICD-10-CM | POA: Diagnosis not present

## 2019-03-21 DIAGNOSIS — I10 Essential (primary) hypertension: Secondary | ICD-10-CM | POA: Diagnosis not present

## 2019-03-24 ENCOUNTER — Other Ambulatory Visit: Payer: Self-pay | Admitting: Family Medicine

## 2019-03-24 DIAGNOSIS — I10 Essential (primary) hypertension: Secondary | ICD-10-CM | POA: Diagnosis not present

## 2019-03-24 DIAGNOSIS — R251 Tremor, unspecified: Secondary | ICD-10-CM | POA: Diagnosis not present

## 2019-03-24 DIAGNOSIS — F411 Generalized anxiety disorder: Secondary | ICD-10-CM | POA: Diagnosis not present

## 2019-04-01 ENCOUNTER — Other Ambulatory Visit: Payer: BLUE CROSS/BLUE SHIELD

## 2019-04-01 DIAGNOSIS — I10 Essential (primary) hypertension: Secondary | ICD-10-CM | POA: Diagnosis not present

## 2019-04-01 DIAGNOSIS — F411 Generalized anxiety disorder: Secondary | ICD-10-CM | POA: Diagnosis not present

## 2019-04-01 DIAGNOSIS — K920 Hematemesis: Secondary | ICD-10-CM | POA: Diagnosis not present

## 2019-04-04 ENCOUNTER — Ambulatory Visit
Admission: RE | Admit: 2019-04-04 | Discharge: 2019-04-04 | Disposition: A | Discharge status: Home / Self Care | Payer: BLUE CROSS/BLUE SHIELD | Source: Ambulatory Visit | Attending: Family Medicine | Admitting: Family Medicine

## 2019-04-04 DIAGNOSIS — I10 Essential (primary) hypertension: Secondary | ICD-10-CM | POA: Diagnosis not present

## 2019-04-18 DIAGNOSIS — I10 Essential (primary) hypertension: Secondary | ICD-10-CM | POA: Diagnosis not present

## 2019-04-18 DIAGNOSIS — R945 Abnormal results of liver function studies: Secondary | ICD-10-CM | POA: Diagnosis not present

## 2019-04-18 DIAGNOSIS — F411 Generalized anxiety disorder: Secondary | ICD-10-CM | POA: Diagnosis not present

## 2019-04-18 DIAGNOSIS — F331 Major depressive disorder, recurrent, moderate: Secondary | ICD-10-CM | POA: Diagnosis not present

## 2019-05-12 ENCOUNTER — Other Ambulatory Visit: Payer: Self-pay

## 2019-05-12 MED ORDER — OMEPRAZOLE 40 MG PO CPDR: 40.0000 mg | DELAYED_RELEASE_CAPSULE | Freq: Every day | ORAL | 5 refills | Status: DC

## 2019-05-12 NOTE — Telephone Encounter (Signed)
Omeprazole refilled , patient up to date on visits.

## 2019-05-30 DIAGNOSIS — R251 Tremor, unspecified: Secondary | ICD-10-CM | POA: Diagnosis not present

## 2019-05-30 DIAGNOSIS — F331 Major depressive disorder, recurrent, moderate: Secondary | ICD-10-CM | POA: Diagnosis not present

## 2019-05-30 DIAGNOSIS — I10 Essential (primary) hypertension: Secondary | ICD-10-CM | POA: Diagnosis not present

## 2019-05-30 DIAGNOSIS — F411 Generalized anxiety disorder: Secondary | ICD-10-CM | POA: Diagnosis not present

## 2019-06-24 DIAGNOSIS — U071 COVID-19: Secondary | ICD-10-CM | POA: Insufficient documentation

## 2019-06-24 HISTORY — DX: COVID-19: U07.1

## 2019-06-27 DIAGNOSIS — F411 Generalized anxiety disorder: Secondary | ICD-10-CM | POA: Diagnosis not present

## 2019-06-27 DIAGNOSIS — I10 Essential (primary) hypertension: Secondary | ICD-10-CM | POA: Diagnosis not present

## 2019-06-27 DIAGNOSIS — F331 Major depressive disorder, recurrent, moderate: Secondary | ICD-10-CM | POA: Diagnosis not present

## 2019-06-27 DIAGNOSIS — R251 Tremor, unspecified: Secondary | ICD-10-CM | POA: Diagnosis not present

## 2019-07-07 DIAGNOSIS — Z20828 Contact with and (suspected) exposure to other viral communicable diseases: Secondary | ICD-10-CM | POA: Diagnosis not present

## 2019-07-24 DIAGNOSIS — H1089 Other conjunctivitis: Secondary | ICD-10-CM | POA: Diagnosis not present

## 2019-08-08 DIAGNOSIS — U071 COVID-19: Secondary | ICD-10-CM | POA: Diagnosis not present

## 2019-08-08 DIAGNOSIS — F411 Generalized anxiety disorder: Secondary | ICD-10-CM | POA: Diagnosis not present

## 2019-08-08 DIAGNOSIS — F331 Major depressive disorder, recurrent, moderate: Secondary | ICD-10-CM | POA: Diagnosis not present

## 2019-08-22 DIAGNOSIS — Z0184 Encounter for antibody response examination: Secondary | ICD-10-CM | POA: Diagnosis not present

## 2019-08-22 DIAGNOSIS — Z0000 Encounter for general adult medical examination without abnormal findings: Secondary | ICD-10-CM | POA: Diagnosis not present

## 2019-08-22 DIAGNOSIS — E7800 Pure hypercholesterolemia, unspecified: Secondary | ICD-10-CM | POA: Diagnosis not present

## 2019-08-26 DIAGNOSIS — Z1211 Encounter for screening for malignant neoplasm of colon: Secondary | ICD-10-CM | POA: Diagnosis not present

## 2019-08-26 DIAGNOSIS — Z0000 Encounter for general adult medical examination without abnormal findings: Secondary | ICD-10-CM | POA: Diagnosis not present

## 2019-08-26 DIAGNOSIS — Z6830 Body mass index (BMI) 30.0-30.9, adult: Secondary | ICD-10-CM | POA: Diagnosis not present

## 2019-08-26 DIAGNOSIS — Z01419 Encounter for gynecological examination (general) (routine) without abnormal findings: Secondary | ICD-10-CM | POA: Diagnosis not present

## 2019-09-02 ENCOUNTER — Other Ambulatory Visit: Payer: Self-pay | Admitting: Family Medicine

## 2019-09-02 DIAGNOSIS — Z1231 Encounter for screening mammogram for malignant neoplasm of breast: Secondary | ICD-10-CM

## 2019-09-19 DIAGNOSIS — F331 Major depressive disorder, recurrent, moderate: Secondary | ICD-10-CM | POA: Diagnosis not present

## 2019-09-19 DIAGNOSIS — F411 Generalized anxiety disorder: Secondary | ICD-10-CM | POA: Diagnosis not present

## 2019-09-19 DIAGNOSIS — R251 Tremor, unspecified: Secondary | ICD-10-CM | POA: Diagnosis not present

## 2019-09-19 DIAGNOSIS — I10 Essential (primary) hypertension: Secondary | ICD-10-CM | POA: Diagnosis not present

## 2019-09-22 DIAGNOSIS — H5213 Myopia, bilateral: Secondary | ICD-10-CM | POA: Diagnosis not present

## 2019-09-22 DIAGNOSIS — H2513 Age-related nuclear cataract, bilateral: Secondary | ICD-10-CM | POA: Diagnosis not present

## 2019-09-25 DIAGNOSIS — Z20822 Contact with and (suspected) exposure to covid-19: Secondary | ICD-10-CM | POA: Diagnosis not present

## 2019-09-25 DIAGNOSIS — U071 COVID-19: Secondary | ICD-10-CM | POA: Diagnosis not present

## 2019-09-27 DIAGNOSIS — Z20828 Contact with and (suspected) exposure to other viral communicable diseases: Secondary | ICD-10-CM | POA: Diagnosis not present

## 2019-10-17 ENCOUNTER — Ambulatory Visit
Admission: RE | Admit: 2019-10-17 | Discharge: 2019-10-17 | Disposition: A | Discharge status: Home / Self Care | Payer: BC Managed Care – PPO | Source: Ambulatory Visit | Attending: Family Medicine | Admitting: Family Medicine

## 2019-10-17 ENCOUNTER — Ambulatory Visit: Payer: BC Managed Care – PPO

## 2019-10-17 DIAGNOSIS — Z1231 Encounter for screening mammogram for malignant neoplasm of breast: Secondary | ICD-10-CM | POA: Diagnosis not present

## 2019-11-02 ENCOUNTER — Other Ambulatory Visit: Payer: Self-pay | Admitting: Gastroenterology

## 2019-12-02 DIAGNOSIS — H903 Sensorineural hearing loss, bilateral: Secondary | ICD-10-CM | POA: Diagnosis not present

## 2019-12-19 DIAGNOSIS — I10 Essential (primary) hypertension: Secondary | ICD-10-CM | POA: Diagnosis not present

## 2019-12-19 DIAGNOSIS — H9319 Tinnitus, unspecified ear: Secondary | ICD-10-CM | POA: Diagnosis not present

## 2019-12-19 DIAGNOSIS — R000 Tachycardia, unspecified: Secondary | ICD-10-CM | POA: Diagnosis not present

## 2019-12-23 DIAGNOSIS — J069 Acute upper respiratory infection, unspecified: Secondary | ICD-10-CM | POA: Diagnosis not present

## 2019-12-23 DIAGNOSIS — I10 Essential (primary) hypertension: Secondary | ICD-10-CM | POA: Diagnosis not present

## 2019-12-23 DIAGNOSIS — Z20828 Contact with and (suspected) exposure to other viral communicable diseases: Secondary | ICD-10-CM | POA: Diagnosis not present

## 2020-01-02 ENCOUNTER — Other Ambulatory Visit: Payer: Self-pay

## 2020-01-02 ENCOUNTER — Emergency Department (HOSPITAL_COMMUNITY): Payer: BC Managed Care – PPO

## 2020-01-02 ENCOUNTER — Encounter (HOSPITAL_COMMUNITY): Payer: Self-pay | Admitting: Emergency Medicine

## 2020-01-02 ENCOUNTER — Emergency Department (HOSPITAL_COMMUNITY)
Admission: EM | Admit: 2020-01-02 | Discharge: 2020-01-02 | Disposition: A | Discharge status: Home / Self Care | Payer: BC Managed Care – PPO | Attending: Emergency Medicine | Admitting: Emergency Medicine

## 2020-01-02 DIAGNOSIS — R202 Paresthesia of skin: Secondary | ICD-10-CM | POA: Diagnosis not present

## 2020-01-02 DIAGNOSIS — R29898 Other symptoms and signs involving the musculoskeletal system: Secondary | ICD-10-CM

## 2020-01-02 DIAGNOSIS — G589 Mononeuropathy, unspecified: Secondary | ICD-10-CM | POA: Insufficient documentation

## 2020-01-02 DIAGNOSIS — M542 Cervicalgia: Secondary | ICD-10-CM | POA: Diagnosis not present

## 2020-01-02 DIAGNOSIS — R2681 Unsteadiness on feet: Secondary | ICD-10-CM | POA: Insufficient documentation

## 2020-01-02 DIAGNOSIS — R531 Weakness: Secondary | ICD-10-CM | POA: Diagnosis not present

## 2020-01-02 DIAGNOSIS — Z87891 Personal history of nicotine dependence: Secondary | ICD-10-CM | POA: Diagnosis not present

## 2020-01-02 DIAGNOSIS — Z79899 Other long term (current) drug therapy: Secondary | ICD-10-CM | POA: Insufficient documentation

## 2020-01-02 DIAGNOSIS — R42 Dizziness and giddiness: Secondary | ICD-10-CM | POA: Insufficient documentation

## 2020-01-02 DIAGNOSIS — Z8616 Personal history of COVID-19: Secondary | ICD-10-CM | POA: Insufficient documentation

## 2020-01-02 DIAGNOSIS — I10 Essential (primary) hypertension: Secondary | ICD-10-CM | POA: Insufficient documentation

## 2020-01-02 DIAGNOSIS — R200 Anesthesia of skin: Secondary | ICD-10-CM

## 2020-01-02 DIAGNOSIS — M5021 Other cervical disc displacement,  high cervical region: Secondary | ICD-10-CM | POA: Diagnosis not present

## 2020-01-02 DIAGNOSIS — Z733 Stress, not elsewhere classified: Secondary | ICD-10-CM | POA: Insufficient documentation

## 2020-01-02 HISTORY — DX: Anesthesia of skin: R20.0

## 2020-01-02 LAB — CBC
HCT: 36.8 % (ref 36.0–46.0)
Hemoglobin: 12.2 g/dL (ref 12.0–15.0)
MCH: 32.3 pg (ref 26.0–34.0)
MCHC: 33.2 g/dL (ref 30.0–36.0)
MCV: 97.4 fL (ref 80.0–100.0)
Platelets: 161 10*3/uL (ref 150–400)
RBC: 3.78 MIL/uL — ABNORMAL LOW (ref 3.87–5.11)
RDW: 13.1 % (ref 11.5–15.5)
WBC: 7 10*3/uL (ref 4.0–10.5)
nRBC: 0 % (ref 0.0–0.2)

## 2020-01-02 LAB — URINALYSIS, ROUTINE W REFLEX MICROSCOPIC
Bilirubin Urine: NEGATIVE
Glucose, UA: NEGATIVE mg/dL
Hgb urine dipstick: NEGATIVE
Ketones, ur: NEGATIVE mg/dL
Nitrite: NEGATIVE
Protein, ur: NEGATIVE mg/dL
Specific Gravity, Urine: 1.02 (ref 1.005–1.030)
pH: 5 (ref 5.0–8.0)

## 2020-01-02 LAB — URINALYSIS, MICROSCOPIC (REFLEX)
Bacteria, UA: NONE SEEN
RBC / HPF: NONE SEEN RBC/hpf (ref 0–5)

## 2020-01-02 LAB — BASIC METABOLIC PANEL
Anion gap: 14 (ref 5–15)
BUN: 17 mg/dL (ref 8–23)
CO2: 25 mmol/L (ref 22–32)
Calcium: 9.4 mg/dL (ref 8.9–10.3)
Chloride: 97 mmol/L — ABNORMAL LOW (ref 98–111)
Creatinine, Ser: 1.1 mg/dL — ABNORMAL HIGH (ref 0.44–1.00)
GFR calc Af Amer: >60 mL/min (ref >60)
GFR calc non Af Amer: 54 mL/min — ABNORMAL LOW (ref >60)
Glucose, Bld: 78 mg/dL (ref 70–99)
Potassium: 3.7 mmol/L (ref 3.5–5.1)
Sodium: 136 mmol/L (ref 135–145)

## 2020-01-02 LAB — CBG MONITORING, ED: Glucose-Capillary: 65 mg/dL — ABNORMAL LOW (ref 70–99)

## 2020-01-02 MED ORDER — THIAMINE HCL 100 MG PO TABS
100.0000 mg | ORAL_TABLET | Freq: Every day | ORAL | Status: DC
  Filled 2020-01-02: qty 1

## 2020-01-02 MED ORDER — LORAZEPAM 1 MG PO TABS: 0.0000 mg | ORAL_TABLET | Freq: Two times a day (BID) | ORAL | Status: DC

## 2020-01-02 MED ORDER — LORAZEPAM 1 MG PO TABS: 0.0000 mg | ORAL_TABLET | Freq: Four times a day (QID) | ORAL | Status: DC

## 2020-01-02 MED ORDER — THIAMINE HCL 100 MG/ML IJ SOLN
100.0000 mg | Freq: Every day | INTRAMUSCULAR | Status: DC
  Administered 2020-01-02: 100 mg via INTRAVENOUS
  Filled 2020-01-02: qty 2

## 2020-01-02 MED ORDER — LORAZEPAM 2 MG/ML IJ SOLN: 0.0000 mg | Freq: Two times a day (BID) | INTRAMUSCULAR | Status: DC

## 2020-01-02 MED ORDER — SODIUM CHLORIDE 0.9 % IV BOLUS
1000.0000 mL | Freq: Once | INTRAVENOUS | Status: AC
  Administered 2020-01-02: 1000 mL via INTRAVENOUS

## 2020-01-02 MED ORDER — PREDNISONE 10 MG (21) PO TBPK: ORAL_TABLET | ORAL | 0 refills | Status: DC

## 2020-01-02 MED ORDER — DEXTROSE 50 % IV SOLN
1.0000 | Freq: Once | INTRAVENOUS | Status: AC
  Administered 2020-01-02: 50 mL via INTRAVENOUS
  Filled 2020-01-02: qty 50

## 2020-01-02 MED ORDER — SODIUM CHLORIDE 0.9% FLUSH
3.0000 mL | Freq: Once | INTRAVENOUS | Status: AC
  Administered 2020-01-02: 3 mL via INTRAVENOUS

## 2020-01-02 MED ORDER — LORAZEPAM 2 MG/ML IJ SOLN
0.0000 mg | Freq: Four times a day (QID) | INTRAMUSCULAR | Status: DC
  Administered 2020-01-02: 2 mg via INTRAVENOUS
  Administered 2020-01-02: 1 mg via INTRAVENOUS
  Filled 2020-01-02 (×2): qty 1

## 2020-01-02 NOTE — Social Work (Signed)
Transition of Care [facility]) - Emergency Department Mini Assessment   Patient Details  Name: Caitlin Rogers MRN: [id number] Date of Birth: 1955/09/22  Transition of Care [facility]) CM/SW Contact:    Vergie Living, LCSW Phone Number: 01/02/2020, 4:46 PM   Clinical Narrative: CSW met with Pt at bedside. Pt is A&Ox4 and pleasant. Pt states that she has never previously had [facility] before, but that she would be willing to engage in home rehabilitative treatment. CSW coordinated with TOC team to engage [facility] agency.   ED Mini Assessment: What brought you to the Emergency Department? : Left hand weakness/numbness  Barriers to Discharge: No Barriers Identified     Means of departure: Car  Interventions which prevented an admission or readmission: [place] or Services    Patient Contact and Communications        ,            CMS Medicare.gov Compare Post Acute Care list provided to:: Patient Choice offered to / list presented to : Patient  Admission diagnosis:  left side weakness Patient Active Problem List   Diagnosis Date Noted  . Essential hypertension 11/11/2016  . Hyperlipidemia 11/11/2016  . GERD (gastroesophageal reflux disease) 11/11/2016  . Palpitations 11/11/2016  . Atypical chest pain 11/11/2016   PCP:  Fanny Bien, MD Pharmacy:   [facility] [address], [place] [address] Phone: [phone] Fax: [phone]

## 2020-01-02 NOTE — ED Triage Notes (Signed)
Patient arrives to ED with complaints of left hand numbness for over a month. Today when patient woke up she reports that she could not move her fingers. Patient states that she drinks everyday and is stressed.

## 2020-01-02 NOTE — Progress Notes (Addendum)
TOC CM spoke to [facility] for [facility] referral. Jonnie Finner RN CCM, [facility] ED TOC CM [phone]  [facility] unable to accept referral. [facility] is out of network. Waiting on confirmation from [facility], [facility], [facility] or [facility]. [place], [place] ED TOC CM [phone]

## 2020-01-02 NOTE — ED Notes (Signed)
Patient transported to MRI 

## 2020-01-02 NOTE — Discharge Instructions (Addendum)
Start taking steroids as prescribed.  Do not take ibuprofen, Advil, Aleve, or Motrin while taking this medicine but you can take Tylenol as prescribed.  Do not exceed more than 4000 mg of Tylenol daily.  Please wear the splint as tolerated.  You do not have to wear it at all times.  Follow-up with Dr. Marcello Moores with neurosurgery on an outpatient basis for reevaluation of your symptoms.  I would recommend calling to make an appointment today.  You can also call your PCP to schedule close follow-up and to have them order physical therapy and Occupational Therapy on an outpatient basis.  Return to the emergency department if any concerning signs or symptoms develop such as severe headaches, progressive weakness, weakness to the other extremities, vision loss, or fevers.

## 2020-01-02 NOTE — Progress Notes (Signed)
Orthopedic Tech Progress Note Patient Details:  Caitlin Rogers [birth year] [id number]  Ortho Devices Type of Ortho Device: Wrist splint Ortho Device/Splint Location: Left upper extremity Ortho Device/Splint Interventions: Ordered, Application   Post Interventions Patient Tolerated: Well Instructions Provided: Adjustment of device, Poper ambulation with device, Care of device   Caitlin Rogers P Lorel Monaco 01/02/2020, 4:54 PM

## 2020-01-02 NOTE — ED Notes (Signed)
Pt given orange juice.

## 2020-01-02 NOTE — ED Provider Notes (Signed)
[facility] Provider Note   CSN: [id number] Arrival date & time: 01/02/20  1114     History Chief Complaint  Patient presents with  . Numbness    Caitlin Rogers is a 64 y.o. female with history of anxiety, arthritis, hypertension, hyperlipidemia, GERD, palpitations presents for evaluation of acute onset, persistent left hand weakness noted upon awakening this morning at 7 AM.  She reports that for the last 2 or 3 months she has felt paresthesias involving the radial aspect of the left hand, and feels they have been worsening over the few months.  Numbness is worse involving the left fifth digit.  Today when she awoke she noted that she could not extend her fingers, mostly the third fourth and fifth digits.  She is having difficulty gripping objects.  When ambulating today she felt a little unsteady as though she might fall as well as some lightheadedness but denies any syncope or room spinning sensation.  She denies headaches, vision changes, nausea, vomiting, chest pain, shortness of breath, fevers.  She reports chronic neck pains and states that several years ago she had to have a cervical spine fusion at an unknown level in [place] after developing weakness of the right upper extremity but the weakness resolved after the surgery.  She also tells me that she is under a great deal of stress.  She states that she had Covid in December and since then has been experiencing "brain fog" and constant tinnitus.  She also tells me that she has been drinking alcohol "more than ever before".  She states that she will typically drink 4-5 liquor-containing beverages and several glasses of wine a night.  Last alcoholic beverage was last night.  She is a former smoker, denies recreational drug use.  She is right-hand dominant.  She reports she has never had an alcohol withdrawal seizure or been in DTs.  The history is provided by the patient.       Past Medical  History:  Diagnosis Date  . Anxiety   . Arthritis   . Atypical chest pain 11/11/2016  . Essential hypertension 11/11/2016  . GERD (gastroesophageal reflux disease)   . GERD (gastroesophageal reflux disease) 11/11/2016  . Hyperlipemia   . Hyperlipidemia 11/11/2016  . Hypertension   . Palpitations 11/11/2016    Patient Active Problem List   Diagnosis Date Noted  . Essential hypertension 11/11/2016  . Hyperlipidemia 11/11/2016  . GERD (gastroesophageal reflux disease) 11/11/2016  . Palpitations 11/11/2016  . Atypical chest pain 11/11/2016    Past Surgical History:  Procedure Laterality Date  . APPENDECTOMY    . BUNIONECTOMY     bilat  . CERVICAL SPINE SURGERY    . CESAREAN SECTION     in toe  . COLONOSCOPY    . MOUTH SURGERY  2019   implant with crown  . POLYPECTOMY    . TONSILLECTOMY    . TUBAL LIGATION    . UPPER GASTROINTESTINAL ENDOSCOPY    . WISDOM TOOTH EXTRACTION       OB History   No obstetric history on file.     Family History  Problem Relation Age of Onset  . Colon cancer Paternal Aunt   . Hypertension Mother   . Diabetes Mother   . Diabetes Mellitus II Mother   . Hypertension Father   . Stroke Father   . Heart disease Father   . Stroke Brother   . Heart attack Paternal Grandfather   . CAD  Brother   . Colon polyps Neg Hx   . Esophageal cancer Neg Hx   . Rectal cancer Neg Hx   . Stomach cancer Neg Hx     Social History   Tobacco Use  . Smoking status: Former Research scientist (life sciences)  . Smokeless tobacco: Never Used  Vaping Use  . Vaping Use: Never used  Substance Use Topics  . Alcohol use: Yes    Alcohol/week: 3.0 standard drinks    Types: 3 Glasses of wine per week    Comment: and some liquor  . Drug use: No    Home Medications Prior to Admission medications   Medication Sig Start Date End Date Taking? Authorizing Provider  metoprolol tartrate (LOPRESSOR) 25 MG tablet Take 25 mg by mouth daily.    [provider]    olmesartan-hydrochlorothiazide (BENICAR HCT) 20-12.5 MG tablet Take 1 tablet by mouth daily. Pt currently taking half tab daily 07/18/18   [provider]  omeprazole (PRILOSEC) 40 MG capsule TAKE 1 CAPSULE BY MOUTH DAILY 11/03/19   Ladene Artist, MD  predniSONE (STERAPRED UNI-PAK 21 TAB) 10 MG (21) TBPK tablet Take 6 tabs by mouth on day 1, then 5 tabs on day 2, then 4 tabs on day 3, then 3 tabs on day 4, 2 tabs on day 5, then 1 tab on day 6 01/02/20   Nils Flack, Chesney Klimaszewski A, PA-C  rosuvastatin (CRESTOR) 5 MG tablet Take 5 mg by mouth at bedtime.    [provider]    Allergies    Patient has no known allergies.  Review of Systems   Review of Systems  Constitutional: Negative for chills and fever.  Eyes: Negative for photophobia and visual disturbance.  Respiratory: Negative for shortness of breath.   Cardiovascular: Negative for chest pain.  Gastrointestinal: Negative for abdominal pain, nausea and vomiting.  Neurological: Positive for tremors, weakness, light-headedness and numbness. Negative for syncope and headaches.  All other systems reviewed and are negative.   Physical Exam Updated Vital Signs BP (!) 106/58   Pulse 91   Temp 98.6 F (37 C) (Oral)   Resp (!) 23   Ht 5\' 3"  (1.6 m)   Wt 77.1 kg   SpO2 93%   BMI 30.11 kg/m   Physical Exam Vitals and nursing note reviewed.  Constitutional:      General: She is not in acute distress.    Appearance: She is well-developed.  HENT:     Head: Normocephalic and atraumatic.  Eyes:     General:        Right eye: No discharge.        Left eye: No discharge.     Conjunctiva/sclera: Conjunctivae normal.  Neck:     Vascular: No JVD.     Trachea: No tracheal deviation.     Comments: No midline spine TTP, no paraspinal muscle tenderness, no deformity, crepitus, or step-off noted  Cardiovascular:     Rate and Rhythm: Normal rate and regular rhythm.     Pulses: Normal pulses.     Heart sounds: Normal heart sounds.   Pulmonary:     Effort: Pulmonary effort is normal.     Breath sounds: Normal breath sounds.  Abdominal:     General: Bowel sounds are normal. There is no distension.     Palpations: Abdomen is soft.     Tenderness: There is no abdominal tenderness. There is no right CVA tenderness, guarding or rebound.  Musculoskeletal:  General: No tenderness.     Cervical back: Neck supple.     Right lower leg: No edema.     Left lower leg: No edema.  Skin:    General: Skin is warm and dry.     Findings: No erythema.  Neurological:     Mental Status: She is alert.     Sensory: Sensory deficit present.     Motor: Weakness present.     Comments: Mental Status:  Alert, thought content appropriate, able to give a coherent history. Speech fluent without evidence of aphasia. Able to follow 2 step commands without difficulty.  Cranial Nerves:  II:  Peripheral visual fields grossly normal, pupils equal, round, reactive to light III,IV, VI: ptosis not present, extra-ocular motions intact bilaterally  V,VII: smile symmetric, facial light touch sensation equal VIII: hearing grossly normal to voice  X: uvula elevates symmetrically  XI: bilateral shoulder shrug symmetric and strong XII: midline tongue extension without fassiculations Motor:  Left hand is tremulous with active movement.  Left third fourth and fifth digits are held in a passively flexed position.  Significant weakness with extension of the digits involving the left hand mostly third fourth and fifth digits and mild weakness with extension at the left wrist compared to the right upper extremity.  Otherwise 5/5 strength of BUE and BLE major muscle groups Sensory: Altered sensation to light touch involving the left fourth and fifth digits mostly but less so the first second and third digits.  Otherwise sensation intact to light touch of face and extremities. Cerebellar: Mild tremulousness of the left hand with finger-to-nose.  Gait: normal  gait and balance. Able to walk on toes and heels with ease.     Psychiatric:        Behavior: Behavior normal.     ED Results / Procedures / Treatments   Labs (all labs ordered are listed, but only abnormal results are displayed) Labs Reviewed  BASIC METABOLIC PANEL - Abnormal; Notable for the following components:      Result Value   Chloride 97 (*)    Creatinine, Ser 1.10 (*)    GFR calc non Af Amer 54 (*)    All other components within normal limits  CBC - Abnormal; Notable for the following components:   RBC 3.78 (*)    All other components within normal limits  URINALYSIS, ROUTINE W REFLEX MICROSCOPIC - Abnormal; Notable for the following components:   Leukocytes,Ua SMALL (*)    All other components within normal limits  CBG MONITORING, ED - Abnormal; Notable for the following components:   Glucose-Capillary 65 (*)    All other components within normal limits  URINALYSIS, MICROSCOPIC (REFLEX)    EKG EKG Interpretation  Date/Time:  Friday January 02 2020 11:13:29 EDT Ventricular Rate:  74 PR Interval:  180 QRS Duration: 76 QT Interval:  386 QTC Calculation: 428 R Axis:   15 Text Interpretation: Normal sinus rhythm Cannot rule out Anterior infarct , age undetermined Abnormal ECG Confirmed by Virgel Manifold [phone]) on 01/02/2020 12:04:37 PM   Radiology MR Brain Wo Contrast (neuro protocol)  Result Date: 01/02/2020 CLINICAL DATA:  Weakness of the left hand with numbness. EXAM: MRI HEAD WITHOUT CONTRAST TECHNIQUE: Multiplanar, multiecho pulse sequences of the brain and surrounding structures were obtained without intravenous contrast. COMPARISON:  None. FINDINGS: Brain: Diffusion imaging does not show any acute or subacute infarction. There are mild chronic small-vessel ischemic changes of the pons. No focal cerebellar insult. Cerebral hemispheres show mild small  vessel change of the white matter. No cortical or large vessel territory infarction. No mass lesion, hemorrhage,  hydrocephalus or extra-axial collection. Vascular: There is atherosclerotic calcification of the major vessels at the base of the brain. Skull and upper cervical spine: Negative Sinuses/Orbits: Clear/normal Other: None IMPRESSION: No acute or subacute finding. No specific cause of the presenting symptoms is identified. Mild chronic appearing small vessel change of the pons and cerebral hemispheric white matter. Electronically Signed   By: Nelson Chimes M.D.   On: 01/02/2020 14:01   MR Cervical Spine Wo Contrast  Result Date: 01/02/2020 CLINICAL DATA:  Left hand weakness and numbness over the last month. EXAM: MRI CERVICAL SPINE WITHOUT CONTRAST TECHNIQUE: Multiplanar, multisequence MR imaging of the cervical spine was performed. No intravenous contrast was administered. COMPARISON:  None. FINDINGS: Alignment: Straightening of the normal cervical lordosis. 2 mm anterolisthesis C7-T1. Vertebrae: Distant ACDF from C4 through C7.  Fusion appears solid. Cord: No cord compression or primary cord lesion. No evidence of myelopathy. Posterior Fossa, vertebral arteries, paraspinal tissues: Negative Disc levels: Foramen magnum is widely patent. There are joint effusions at the skull base C1 articulations, right more than left, which could be associated with craniocervical pain syndromes. C2-3: No disc abnormality. Facet osteoarthritis left worse than right. No canal stenosis. Mild foraminal narrowing, not likely compressive. C3-4: Minimal bulging of the disc. Bilateral uncovertebral hypertrophy right more than left. Facet degeneration and hypertrophy right more than left. No compressive canal stenosis. Foraminal narrowing right more than left. Either C4 nerve could be affected, more likely the right. C4 through C7: Previous ACDF with wide patency of the canal and foramina. C7-T1: Facet osteoarthritis allowing 2 mm of anterolisthesis. No disc bulge or herniation. No compressive canal stenosis. Bilateral foraminal narrowing  left worse than right. Some potential that either C8 nerve could be affected. IMPRESSION: Good appearance in the fusion segment from C4 through C7. C7-T1: Facet osteoarthritis with 2 mm of anterolisthesis. Bilateral foraminal narrowing, left more than right. Either C8 nerve could be affected, more likely the left. C3-4: Uncovertebral prominence right more than left. Facet osteoarthritis right more than left. Bilateral foraminal narrowing right worse than left. Either C4 nerve could be affected, more likely the right. Small effusions at the articulations of the occipital condyles and C1. This could be associated with craniocervical pain syndromes. Electronically Signed   By: Nelson Chimes M.D.   On: 01/02/2020 14:30    Procedures Procedures (including critical care time)  Medications Ordered in ED Medications  LORazepam (ATIVAN) injection 0-4 mg (2 mg Intravenous Given 01/02/20 1513)    Or  LORazepam (ATIVAN) tablet 0-4 mg ( Oral See Alternative 01/02/20 1513)  LORazepam (ATIVAN) injection 0-4 mg (has no administration in time range)    Or  LORazepam (ATIVAN) tablet 0-4 mg (has no administration in time range)  thiamine tablet 100 mg ( Oral See Alternative 01/02/20 1311)    Or  thiamine (B-1) injection 100 mg (100 mg Intravenous Given 01/02/20 1311)  sodium chloride flush (NS) 0.9 % injection 3 mL (3 mLs Intravenous Given 01/02/20 1312)  sodium chloride 0.9 % bolus 1,000 mL (0 mLs Intravenous Stopped 01/02/20 1634)  dextrose 50 % solution 50 mL (50 mLs Intravenous Given 01/02/20 1325)    ED Course  I have reviewed the triage vital signs and the nursing notes.  Pertinent labs & imaging results that were available during my care of the patient were reviewed by me and considered in my medical decision making (see  chart for details).    MDM Rules/Calculators/A&P                          Patient presenting for evaluation of sudden onset left hand weakness beginning upon awakening today at 7 AM.  Has  had progressively worsening paresthesias involving this area for months, worse along the radial aspect of the hand.  She is afebrile, vital signs are stable.  She is anxious in appearance but nontoxic.  She exhibits a left wrist drop though has 4/5 strength with extension against resistance.  Muscle mass appears preserved bilaterally.  She has significant weakness with extension of the digits of the left hand, most notably with the left third fourth and fifth digits.  Paresthesias mostly corresponding in an ulnar nerve distribution.  She does feel a little unsteady with ambulation as well though she ambulates with a steady gait and balance on my assessment.  Romberg sign absent.  Differential considerations at this time include CVA, Saturday night palsy, cervical disc herniation with resultant nerve impingement.  She has a history of alcohol abuse, CIWA score is currently 9.  CIWA protocol initiated.  Lab work reviewed and interpreted by myself shows no leukocytosis, no anemia, no metabolic derangements, mildly elevated creatinine but BUN is within normal limits.  She states that she feels dehydrated so we will give her IV fluids.  She underwent MRI of the brain and cervical spine without contrast which showed no evidence of CVA or other acute intracranial abnormality.  No evidence of intracranial hemorrhage.  Cervical spine imaging shows bilateral foraminal narrowing at the level of C7/T1 worse on the left side which could cause C8 nerve root impingement.  3:25PM CONSULT: Spoke with Dr. Marcello Moores with neurosurgery who has reviewed the patient's images.  He notes that the foraminal narrowing at the level of C7-T1 is mild and likely would not result in the marked weakness patient is presenting with.  He feels that her presentation is likely more consistent with a peripheral nerve palsy.  He agrees with plan to place the patient in a splint and start her on corticosteroids and will see her in the office on an  outpatient basis for reevaluation.  He advises that if she has persistent weakness for 6 weeks then she may need an EMG.  He also advises it would be reasonable to get her set up for outpatient occupational therapy.  On reevaluation patient is resting comfortably in no distress.  No evidence of severe alcohol withdrawals.  I discussed neurosurgery's recommendations with her and her husband who is at the bedside and they are in agreement with the plan.  Case management was consulted to help arrange for outpatient therapy but she will call her PCP to help get this scheduled as well.  Discussed strict ED return precautions.  Patient and husband verbalized understanding of and agreement with plan and patient is stable for discharge at this time.  Discussed with Dr. Wilson Singer who agrees with assessment and plan at this time.   Final Clinical Impression(s) / ED Diagnoses Final diagnoses:  Upper extremity weakness  Nerve palsy  Paresthesia    Rx / DC Orders ED Discharge Orders         Ordered    predniSONE (STERAPRED UNI-PAK 21 TAB) 10 MG (21) TBPK tablet     Discontinue  Reprint     01/02/20 [place]  01/02/20 1625    Face-to-face encounter (required for Medicare/Medicaid patients)     Discontinue  Reprint    Comments: I Renita Papa certify that this patient is under my care and that I, or a nurse practitioner or physician's assistant working with me, had a face-to-face encounter that meets the physician face-to-face encounter requirements with this patient on 01/02/2020. The encounter with the patient was in whole, or in part for the following medical condition(s) which is the primary reason for home health care (List medical condition): left upper extremity weakness   01/02/20 1625           Debroah Baller 01/02/20 1652    Virgel Manifold, MD 01/03/20 1307

## 2020-01-05 DIAGNOSIS — G8324 Monoplegia of upper limb affecting left nondominant side: Secondary | ICD-10-CM | POA: Diagnosis not present

## 2020-01-07 DIAGNOSIS — R9431 Abnormal electrocardiogram [ECG] [EKG]: Secondary | ICD-10-CM | POA: Diagnosis not present

## 2020-01-07 DIAGNOSIS — M5412 Radiculopathy, cervical region: Secondary | ICD-10-CM | POA: Diagnosis not present

## 2020-01-07 DIAGNOSIS — B948 Sequelae of other specified infectious and parasitic diseases: Secondary | ICD-10-CM | POA: Diagnosis not present

## 2020-01-07 DIAGNOSIS — H9319 Tinnitus, unspecified ear: Secondary | ICD-10-CM | POA: Diagnosis not present

## 2020-01-19 ENCOUNTER — Other Ambulatory Visit: Payer: Self-pay

## 2020-01-19 ENCOUNTER — Ambulatory Visit (INDEPENDENT_AMBULATORY_CARE_PROVIDER_SITE_OTHER): Payer: BC Managed Care – PPO

## 2020-01-19 ENCOUNTER — Encounter: Payer: Self-pay | Admitting: Cardiology

## 2020-01-19 ENCOUNTER — Ambulatory Visit (INDEPENDENT_AMBULATORY_CARE_PROVIDER_SITE_OTHER): Payer: BC Managed Care – PPO | Admitting: Cardiology

## 2020-01-19 VITALS — BP 142/72 | HR 94 | Ht 63.0 in | Wt 165.0 lb

## 2020-01-19 DIAGNOSIS — M4712 Other spondylosis with myelopathy, cervical region: Secondary | ICD-10-CM

## 2020-01-19 DIAGNOSIS — R011 Cardiac murmur, unspecified: Secondary | ICD-10-CM

## 2020-01-19 DIAGNOSIS — R0789 Other chest pain: Secondary | ICD-10-CM

## 2020-01-19 DIAGNOSIS — R002 Palpitations: Secondary | ICD-10-CM

## 2020-01-19 DIAGNOSIS — E782 Mixed hyperlipidemia: Secondary | ICD-10-CM | POA: Diagnosis not present

## 2020-01-19 DIAGNOSIS — I10 Essential (primary) hypertension: Secondary | ICD-10-CM | POA: Diagnosis not present

## 2020-01-19 DIAGNOSIS — R072 Precordial pain: Secondary | ICD-10-CM

## 2020-01-19 HISTORY — DX: Other chest pain: R07.89

## 2020-01-19 HISTORY — DX: Cardiac murmur, unspecified: R01.1

## 2020-01-19 HISTORY — DX: Other spondylosis with myelopathy, cervical region: M47.12

## 2020-01-19 NOTE — Progress Notes (Signed)
Cardiology Office Note:    Date:  01/19/2020   ID:  Caitlin Rogers, DOB July 08, 1956, MRN [id number]  PCP:  Fanny Bien, MD  Cardiologist:  Jenean Lindau, MD   Referring MD: Fanny Bien, MD    ASSESSMENT:    1. Essential hypertension   2. Mixed hyperlipidemia   3. Chest discomfort   4. Cardiac murmur   5. Palpitations    PLAN:    In order of problems listed above:  1. Primary prevention stressed with the patient.  Importance of compliance with diet medication stressed and she vocalized understanding. 2. Essential hypertension: Blood pressure is stable.  Salt intake issues were discussed. 3. Mixed dyslipidemia: Patient is on statin therapy and we will get her lipid check in the next few days. 4. Palpitations: I will do a event monitor to assess her palpitations.  I will also do a TSH to assess this. 5. Chest discomfort: Atypical symptoms but she has multiple risk factors for coronary artery disease and leads a sedentary lifestyle and we will do a Lexiscan sestamibi.  She knows to go to nearest emergency room for any concerning symptoms. 6. Patient will be seen in follow-up appointment in 6 weeks or earlier if the patient has any concerns    Medication Adjustments/Labs and Tests Ordered: Current medicines are reviewed at length with the patient today.  Concerns regarding medicines are outlined above.  No orders of the defined types were placed in this encounter.  No orders of the defined types were placed in this encounter.    History of Present Illness:    Caitlin Rogers is a 64 y.o. female who is being seen today for the evaluation of palpitations and chest discomfort at the request of Fanny Bien, MD.  Patient is a pleasant 64 year old female.  She has past medical history of essential hypertension, dyslipidemia.  She had weakness in the left hand.  She has been evaluated by neurology for this.  She tells me that she was told that she did not have a  stroke.  At the time of my evaluation, the patient is alert awake oriented and in no distress.  She is for neurosurgeon this morning.  She has palpitations at times especially when she is anxious.  No orthopnea or PND.  She does not exercise on a regular basis.  She has chest discomfort occasionally.  This is not related to exertion.  Past Medical History:  Diagnosis Date  . Anxiety   . Arthritis   . Atypical chest pain 11/11/2016  . Essential hypertension 11/11/2016  . GERD (gastroesophageal reflux disease)   . GERD (gastroesophageal reflux disease) 11/11/2016  . Hyperlipemia   . Hyperlipidemia 11/11/2016  . Hypertension   . Palpitations 11/11/2016    Past Surgical History:  Procedure Laterality Date  . APPENDECTOMY    . BUNIONECTOMY     bilat  . CERVICAL SPINE SURGERY    . CESAREAN SECTION     in toe  . COLONOSCOPY    . MOUTH SURGERY  2019   implant with crown  . POLYPECTOMY    . TONSILLECTOMY    . TUBAL LIGATION    . UPPER GASTROINTESTINAL ENDOSCOPY    . WISDOM TOOTH EXTRACTION      Current Medications: Current Meds  Medication Sig  . ALPRAZolam (XANAX) 0.5 MG tablet   . metoprolol succinate (TOPROL-XL) 25 MG 24 hr tablet   . olmesartan-hydrochlorothiazide (BENICAR HCT) 40-12.5 MG tablet Take 1 tablet by mouth  daily.  . omeprazole (PRILOSEC) 40 MG capsule TAKE 1 CAPSULE BY MOUTH DAILY  . rosuvastatin (CRESTOR) 5 MG tablet Take 5 mg by mouth at bedtime.  . sertraline (ZOLOFT) 100 MG tablet Take 100 mg by mouth daily.  . [DISCONTINUED] sertraline (ZOLOFT) 25 MG tablet TK 1 TO 2 T PO QD     Allergies:   Patient has no known allergies.   Social History   Socioeconomic History  . Marital status: Married    Spouse name: Not on file  . Number of children: Not on file  . Years of education: Not on file  . Highest education level: Not on file  Occupational History  . Not on file  Tobacco Use  . Smoking status: Former Research scientist (life sciences)  . Smokeless tobacco: Never Used  Vaping  Use  . Vaping Use: Never used  Substance and Sexual Activity  . Alcohol use: Yes    Alcohol/week: 3.0 standard drinks    Types: 3 Glasses of wine per week    Comment: and some liquor  . Drug use: No  . Sexual activity: Not on file  Other Topics Concern  . Not on file  Social History Narrative  . Not on file   Social Determinants of Health   Financial Resource Strain:   . Difficulty of Paying Living Expenses:   Food Insecurity:   . Worried About Charity fundraiser in the Last Year:   . Arboriculturist in the Last Year:   Transportation Needs:   . Film/video editor (Medical):   Marland Kitchen Lack of Transportation (Non-Medical):   Physical Activity:   . Days of Exercise per Week:   . Minutes of Exercise per Session:   Stress:   . Feeling of Stress :   Social Connections:   . Frequency of Communication with Friends and Family:   . Frequency of Social Gatherings with Friends and Family:   . Attends Religious Services:   . Active Member of Clubs or Organizations:   . Attends Archivist Meetings:   Marland Kitchen Marital Status:      Family History: The patient's family history includes CAD in her brother; Colon cancer in her paternal aunt; Diabetes in her mother; Diabetes Mellitus II in her mother; Heart attack in her paternal grandfather; Heart disease in her father; Hypertension in her father and mother; Stroke in her brother and father. There is no history of Colon polyps, Esophageal cancer, Rectal cancer, or Stomach cancer.  ROS:   Please see the history of present illness.    All other systems reviewed and are negative.  EKGs/Labs/Other Studies Reviewed:    The following studies were reviewed today: EKG reveals sinus rhythm and nonspecific ST-T changes done at primary care physician's office   Recent Labs: 01/02/2020: BUN 17; Creatinine, Ser 1.10; Hemoglobin 12.2; Platelets 161; Potassium 3.7; Sodium 136  Recent Lipid Panel No results found for: CHOL, TRIG, HDL, CHOLHDL,  VLDL, LDLCALC, LDLDIRECT  Physical Exam:    VS:  BP (!) 142/72   Pulse 94   Ht 5\' 3"  (1.6 m)   Wt 165 lb (74.8 kg)   SpO2 96%   BMI 29.23 kg/m     Wt Readings from Last 3 Encounters:  01/19/20 165 lb (74.8 kg)  01/02/20 170 lb (77.1 kg)  09/27/18 162 lb (73.5 kg)     GEN: Patient is in no acute distress HEENT: Normal NECK: No JVD; No carotid bruits LYMPHATICS: No lymphadenopathy CARDIAC: S1 S2  regular, 2/6 systolic murmur at the apex. RESPIRATORY:  Clear to auscultation without rales, wheezing or rhonchi  ABDOMEN: Soft, non-tender, non-distended MUSCULOSKELETAL:  No edema; No deformity  SKIN: Warm and dry NEUROLOGIC:  Alert and oriented x 3 PSYCHIATRIC:  Normal affect    Signed, Jenean Lindau, MD  01/19/2020 1:57 PM    [facility]

## 2020-01-19 NOTE — Patient Instructions (Signed)
Medication Instructions:  No medication changes. *If you need a refill on your cardiac medications before your next appointment, please call your pharmacy*   Lab Work: Your physician recommends that you return for lab work in: the next few days. You need to have labs done when you are fasting.  You can come Monday through Friday 8:30 am to 12:00 pm and 1:15 to 4:30. You do not need to make an appointment as the order has already been placed. The labs you are going to have done are TSH, LFT and Lipids.  If you have labs (blood work) drawn today and your tests are completely normal, you will receive your results only by: Marland Kitchen MyChart Message (if you have MyChart) OR . A paper copy in the mail If you have any lab test that is abnormal or we need to change your treatment, we will call you to review the results.   Testing/Procedures: Your physician has requested that you have an echocardiogram. Echocardiography is a painless test that uses sound waves to create images of your heart. It provides your doctor with information about the size and shape of your heart and how well your heart's chambers and valves are working. This procedure takes approximately one hour. There are no restrictions for this procedure.  Your physician has requested that you have a lexiscan myoview. For further information please visit [email or web address]. Please follow instruction sheet, as given.  The test will take approximately 3 to 4 hours to complete; you may bring reading material.  If someone comes with you to your appointment, they will need to remain in the main lobby due to limited space in the testing area.  How to prepare for your Myocardial Perfusion Test: . Do not eat or drink 3 hours prior to your test, except you may have water. . Do not consume products containing caffeine (regular or decaffeinated) 12 hours prior to your test. (ex: coffee, chocolate, sodas, tea). . Do bring a list of your current medications  with you.  If not listed below, you may take your medications as normal. . Do wear comfortable clothes (no dresses or overalls) and walking shoes, tennis shoes preferred (No heels or open toe shoes are allowed). . Do NOT wear cologne, perfume, aftershave, or lotions (deodorant is allowed). . If these instructions are not followed, your test will have to be rescheduled.  WHY IS MY DOCTOR PRESCRIBING ZIO? The Zio system is proven and trusted by physicians to detect and diagnose irregular heart rhythms -- and has been prescribed to hundreds of thousands of patients.  The FDA has cleared the Zio system to monitor for many different kinds of irregular heart rhythms. In a study, physicians were able to reach a diagnosis 90% of the time with the Zio system1.  You can wear the Zio monitor -- a small, discreet, comfortable patch -- during your normal day-to-day activity, including while you sleep, shower, and exercise, while it records every single heartbeat for analysis.  1Barrett, P., et al. Comparison of 24 Hour Holter Monitoring Versus 14 Day Novel Adhesive Patch Electrocardiographic Monitoring. [place], 2014.  ZIO VS. HOLTER MONITORING The Zio monitor can be comfortably worn for up to 14 days. Holter monitors can be worn for 24 to 48 hours, limiting the time to record any irregular heart rhythms you may have. Zio is able to capture data for the 51% of patients who have their first symptom-triggered arrhythmia after 48 hours.1  LIVE WITHOUT RESTRICTIONS The Zio ambulatory  cardiac monitor is a small, unobtrusive, and water-resistant patch--you might even forget you're wearing it. The Zio monitor records and stores every beat of your heart, whether you're sleeping, working out, or showering.  Wear for 2 weeks, remove 02/02/20.   Follow-Up: At [facility], you and your health needs are our priority.  As part of our continuing mission to provide you with exceptional heart care,  we have created designated Provider Care Teams.  These Care Teams include your primary Cardiologist (physician) and Advanced Practice Providers (APPs -  Physician Assistants and Nurse Practitioners) who all work together to provide you with the care you need, when you need it.  We recommend signing up for the patient portal called "MyChart".  Sign up information is provided on this After Visit Summary.  MyChart is used to connect with patients for Virtual Visits (Telemedicine).  Patients are able to view lab/test results, encounter notes, upcoming appointments, etc.  Non-urgent messages can be sent to your provider as well.   To learn more about what you can do with MyChart, go to [email or web address].    Your next appointment:   2 month(s)  The format for your next appointment:   In Person  Provider:   Jyl Heinz, MD   Other Instructions  Echocardiogram An echocardiogram is a procedure that uses painless sound waves (ultrasound) to produce an image of the heart. Images from an echocardiogram can provide important information about:  Signs of coronary artery disease (CAD).  Aneurysm detection. An aneurysm is a weak or damaged part of an artery wall that bulges out from the normal force of blood pumping through the body.  Heart size and shape. Changes in the size or shape of the heart can be associated with certain conditions, including heart failure, aneurysm, and CAD.  Heart muscle function.  Heart valve function.  Signs of a past heart attack.  Fluid buildup around the heart.  Thickening of the heart muscle.  A tumor or infectious growth around the heart valves. Tell a health care provider about:  Any allergies you have.  All medicines you are taking, including vitamins, herbs, eye drops, creams, and over-the-counter medicines.  Any blood disorders you have.  Any surgeries you have had.  Any medical conditions you have.  Whether you are pregnant or may be  pregnant. What are the risks? Generally, this is a safe procedure. However, problems may occur, including:  Allergic reaction to dye (contrast) that may be used during the procedure. What happens before the procedure? No specific preparation is needed. You may eat and drink normally. What happens during the procedure?   An IV tube may be inserted into one of your veins.  You may receive contrast through this tube. A contrast is an injection that improves the quality of the pictures from your heart.  A gel will be applied to your chest.  A wand-like tool (transducer) will be moved over your chest. The gel will help to transmit the sound waves from the transducer.  The sound waves will harmlessly bounce off of your heart to allow the heart images to be captured in real-time motion. The images will be recorded on a computer. The procedure may vary among health care providers and hospitals. What happens after the procedure?  You may return to your normal, everyday life, including diet, activities, and medicines, unless your health care provider tells you not to do that. Summary  An echocardiogram is a procedure that uses painless sound waves (ultrasound) to  produce an image of the heart.  Images from an echocardiogram can provide important information about the size and shape of your heart, heart muscle function, heart valve function, and fluid buildup around your heart.  You do not need to do anything to prepare before this procedure. You may eat and drink normally.  After the echocardiogram is completed, you may return to your normal, everyday life, unless your health care provider tells you not to do that. This information is not intended to replace advice given to you by your health care provider. Make sure you discuss any questions you have with your health care provider. Document Revised: 10/31/2018 Document Reviewed: 08/12/2016 Elsevier Patient Education  2020 [place].   Nuclear Medicine Exam A nuclear medicine exam is a safe and painless imaging test. It helps your health care provider detect and diagnose diseases. It also provides information about the ways your organs work and how they are structured. For a nuclear medicine exam, you will be given a radioactive tracer. This substance is absorbed by your body's organs. A large scanning machine detects the tracer and creates pictures of the areas that your health care provider wants to know more about. There are several kinds of nuclear medicine exams. They include the following:  CT scan.  MRI scan.  PET scan.  SPECT scan. Tell your health care provider about:  Any allergies you have.  All medicines you are taking, including vitamins, herbs, eye drops, creams, and over-the-counter medicines.  Any problems you or family members have had with anesthetic medicines.  Any blood disorders you have.  Any surgeries you have had.  Any medical conditions you have.  Whether you are pregnant or may be pregnant.  Whether you are nursing. What are the risks? Generally, this is a safe procedure. However, problems may occur, such as an allergic reaction to the tracer, but this is rare. What happens before the procedure? Medicines Ask your health care provider about:  Changing or stopping your regular medicines. This is especially important if you are taking diabetes medicines or blood thinners.  Taking medicines such as aspirin and ibuprofen. These medicines can thin your blood. Do not take these medicines unless your health care provider tells you to take them.  Taking over-the-counter medicines, vitamins, herbs, and supplements. General instructions  Follow instructions from your health care provider about eating or drinking restrictions.  Do not wear jewelry.  Wear loose, comfortable clothing. You may be asked to wear a hospital gown for the procedure.  Bring previous imaging studies,  such as X-rays, with you to the exam if they are available. What happens during the procedure?   An IV may be inserted into one of your veins.  You will be asked to lie on a table or sit in a chair.  You will be given the radioactive tracer. You may get: ? A pill or liquid to swallow. ? An injection. ? Medicine through your IV. ? A gas to inhale.  A large scanning machine will be used to create images of your body. After the pictures are taken, you may have to wait so your health care provider can make sure that enough images were taken. The procedure may vary among health care providers and hospitals. What happens after the procedure?  You may go home after the procedure and return to your usual activities, unless your health care provider tells you otherwise.  Drink enough water to keep your urine pale yellow. This helps to  remove the radioactive tracer from your body.  It is up to you to get the results of your procedure. Ask your health care provider, or the department that is doing the procedure, when your results will be ready.  Get help right away if you have problems breathing. Summary  A nuclear medicine exam is a safe and painless imaging test that provides information about how your organs are working. It is also used to detect and diagnose diseases of various body organs.  Follow your health care provider's instructions about eating and drinking restrictions. Ask whether you should change or stop any medicines.  During the procedure, you will be given a radioactive tracer. A large scanning machine will create images of your body.  You may go home after the procedure and return to your regular activities. Follow your health care provider's instructions.  Get help right away if you have problems breathing. This information is not intended to replace advice given to you by your health care provider. Make sure you discuss any questions you have with your health care  provider. Document Revised: 05/29/2018 Document Reviewed: 05/29/2018 Elsevier Patient Education  [place].

## 2020-01-20 ENCOUNTER — Other Ambulatory Visit: Payer: Self-pay | Admitting: Neurosurgery

## 2020-01-20 DIAGNOSIS — M4712 Other spondylosis with myelopathy, cervical region: Secondary | ICD-10-CM

## 2020-01-22 ENCOUNTER — Telehealth: Payer: Self-pay | Admitting: Cardiology

## 2020-01-22 DIAGNOSIS — R079 Chest pain, unspecified: Secondary | ICD-10-CM | POA: Diagnosis not present

## 2020-01-22 DIAGNOSIS — I10 Essential (primary) hypertension: Secondary | ICD-10-CM | POA: Diagnosis not present

## 2020-01-22 DIAGNOSIS — F411 Generalized anxiety disorder: Secondary | ICD-10-CM | POA: Diagnosis not present

## 2020-01-22 NOTE — Telephone Encounter (Signed)
Dr. Ival Bible office is requesting last OV notes and any testing that was done to be faxed to their office. She is faxing over a request for records. Fax number is [phone].

## 2020-01-22 NOTE — Telephone Encounter (Signed)
Records routed in [place].

## 2020-02-06 ENCOUNTER — Ambulatory Visit
Admission: RE | Admit: 2020-02-06 | Discharge: 2020-02-06 | Disposition: A | Discharge status: Home / Self Care | Payer: BC Managed Care – PPO | Source: Ambulatory Visit | Attending: Neurosurgery | Admitting: Neurosurgery

## 2020-02-06 DIAGNOSIS — M4322 Fusion of spine, cervical region: Secondary | ICD-10-CM | POA: Diagnosis not present

## 2020-02-06 DIAGNOSIS — M4802 Spinal stenosis, cervical region: Secondary | ICD-10-CM | POA: Diagnosis not present

## 2020-02-06 DIAGNOSIS — M4712 Other spondylosis with myelopathy, cervical region: Secondary | ICD-10-CM

## 2020-02-09 ENCOUNTER — Other Ambulatory Visit: Payer: Self-pay | Admitting: Gastroenterology

## 2020-02-10 ENCOUNTER — Telehealth (HOSPITAL_COMMUNITY): Payer: Self-pay | Admitting: Radiology

## 2020-02-10 NOTE — Telephone Encounter (Signed)
Patient given detailed instructions per Myocardial Perfusion Study Information Sheet for the test on 7/26/201 at 8;15. Patient notified to arrive 15 minutes early and that it is imperative to arrive on time for appointment to keep from having the test rescheduled.  If you need to cancel or reschedule your appointment, please call the office within 24 hours of your appointment. . Patient verbalized understanding.EHK

## 2020-02-13 ENCOUNTER — Ambulatory Visit: Payer: BC Managed Care – PPO | Admitting: Gastroenterology

## 2020-02-13 DIAGNOSIS — M4712 Other spondylosis with myelopathy, cervical region: Secondary | ICD-10-CM | POA: Diagnosis not present

## 2020-02-13 DIAGNOSIS — M5412 Radiculopathy, cervical region: Secondary | ICD-10-CM | POA: Insufficient documentation

## 2020-02-13 HISTORY — DX: Radiculopathy, cervical region: M54.12

## 2020-02-16 ENCOUNTER — Ambulatory Visit (HOSPITAL_COMMUNITY): Payer: BC Managed Care – PPO | Attending: Cardiovascular Disease

## 2020-02-16 ENCOUNTER — Other Ambulatory Visit: Payer: Self-pay

## 2020-02-16 ENCOUNTER — Telehealth: Payer: Self-pay

## 2020-02-16 ENCOUNTER — Ambulatory Visit (HOSPITAL_BASED_OUTPATIENT_CLINIC_OR_DEPARTMENT_OTHER): Payer: BC Managed Care – PPO

## 2020-02-16 ENCOUNTER — Other Ambulatory Visit: Payer: BC Managed Care – PPO | Admitting: *Deleted

## 2020-02-16 DIAGNOSIS — R011 Cardiac murmur, unspecified: Secondary | ICD-10-CM

## 2020-02-16 DIAGNOSIS — I10 Essential (primary) hypertension: Secondary | ICD-10-CM | POA: Diagnosis not present

## 2020-02-16 DIAGNOSIS — R072 Precordial pain: Secondary | ICD-10-CM

## 2020-02-16 DIAGNOSIS — R002 Palpitations: Secondary | ICD-10-CM

## 2020-02-16 DIAGNOSIS — E782 Mixed hyperlipidemia: Secondary | ICD-10-CM | POA: Diagnosis not present

## 2020-02-16 DIAGNOSIS — R0789 Other chest pain: Secondary | ICD-10-CM | POA: Diagnosis not present

## 2020-02-16 LAB — MYOCARDIAL PERFUSION IMAGING
LV dias vol: 47 mL (ref 46–106)
LV sys vol: 5 mL
Peak HR: 86 {beats}/min
Rest HR: 63 {beats}/min
SDS: 0
SRS: 0
SSS: 0
TID: 0.8

## 2020-02-16 LAB — ECHOCARDIOGRAM COMPLETE
Area-P 1/2: 3.85 cm2
Height: 63 in
S' Lateral: 2.3 cm
Weight: 2640 oz

## 2020-02-16 MED ORDER — REGADENOSON 0.4 MG/5ML IV SOLN
0.4000 mg | Freq: Once | INTRAVENOUS | Status: AC
  Administered 2020-02-16: 0.4 mg via INTRAVENOUS

## 2020-02-16 MED ORDER — TECHNETIUM TC 99M TETROFOSMIN IV KIT
30.4000 | PACK | Freq: Once | INTRAVENOUS | Status: AC | PRN
  Administered 2020-02-16: 30.4 via INTRAVENOUS
  Filled 2020-02-16: qty 31

## 2020-02-16 MED ORDER — TECHNETIUM TC 99M TETROFOSMIN IV KIT
10.1000 | PACK | Freq: Once | INTRAVENOUS | Status: AC | PRN
  Administered 2020-02-16: 10.1 via INTRAVENOUS
  Filled 2020-02-16: qty 11

## 2020-02-16 NOTE — Telephone Encounter (Signed)
Spoke with patient regarding results and recommendation.  Patient verbalizes understanding and is agreeable to plan of care. Advised patient to call back with any issues or concerns.  

## 2020-02-16 NOTE — Telephone Encounter (Signed)
-----   Message from Jenean Lindau, MD sent at 02/16/2020  3:23 PM EDT ----- The results of the study is unremarkable. Please inform patient. I will discuss in detail at next appointment. Cc  primary care/referring physician Jenean Lindau, MD 02/16/2020 3:22 PM

## 2020-02-17 ENCOUNTER — Telehealth: Payer: Self-pay | Admitting: Emergency Medicine

## 2020-02-17 DIAGNOSIS — E782 Mixed hyperlipidemia: Secondary | ICD-10-CM

## 2020-02-17 LAB — LIPID PANEL
Chol/HDL Ratio: 1.6 ratio (ref 0.0–4.4)
Cholesterol, Total: 226 mg/dL — ABNORMAL HIGH (ref 100–199)
HDL: 137 mg/dL (ref >39)
LDL Chol Calc (NIH): 80 mg/dL (ref 0–99)
Triglycerides: 48 mg/dL (ref 0–149)
VLDL Cholesterol Cal: 9 mg/dL (ref 5–40)

## 2020-02-17 LAB — HEPATIC FUNCTION PANEL
ALT: 53 IU/L — ABNORMAL HIGH (ref 0–32)
AST: 72 IU/L — ABNORMAL HIGH (ref 0–40)
Albumin: 4.6 g/dL (ref 3.8–4.8)
Alkaline Phosphatase: 79 IU/L (ref 48–121)
Bilirubin Total: 0.4 mg/dL (ref 0.0–1.2)
Bilirubin, Direct: 0.14 mg/dL (ref 0.00–0.40)
Total Protein: 7.1 g/dL (ref 6.0–8.5)

## 2020-02-17 LAB — TSH: TSH: 2.97 u[IU]/mL (ref 0.450–4.500)

## 2020-02-17 NOTE — Telephone Encounter (Signed)
Called patient informed her of results. Informed her to stop crestor and have labs redrawn in 1 month per Dr. Geraldo Pitter. She was advised to follow up with pcp. Results cc'd to pcp.

## 2020-02-17 NOTE — Telephone Encounter (Signed)
-----   Message from Jenean Lindau, MD sent at 02/17/2020  1:21 PM EDT ----- LFTs are elevated.  Stop rosuvastatin and recheck LFTs only in 1 month.  Copy to primary care.  Let the patient know that she needs to contact primary care to address this.  Lipids are fine. Jenean Lindau, MD 02/17/2020 1:20 PM

## 2020-02-18 DIAGNOSIS — M5412 Radiculopathy, cervical region: Secondary | ICD-10-CM | POA: Diagnosis not present

## 2020-02-20 ENCOUNTER — Telehealth: Payer: Self-pay | Admitting: Cardiology

## 2020-02-20 NOTE — Telephone Encounter (Signed)
Patient called back to discuss monitor results

## 2020-02-20 NOTE — Telephone Encounter (Signed)
The patient has been notified of the result and verbalized understanding.  All questions (if any) were answered. Antonieta Iba, RN 02/20/2020 4:43 PM

## 2020-02-24 DIAGNOSIS — R945 Abnormal results of liver function studies: Secondary | ICD-10-CM | POA: Diagnosis not present

## 2020-02-24 DIAGNOSIS — M4692 Unspecified inflammatory spondylopathy, cervical region: Secondary | ICD-10-CM | POA: Diagnosis not present

## 2020-02-24 DIAGNOSIS — M5412 Radiculopathy, cervical region: Secondary | ICD-10-CM | POA: Diagnosis not present

## 2020-02-24 DIAGNOSIS — E7800 Pure hypercholesterolemia, unspecified: Secondary | ICD-10-CM | POA: Diagnosis not present

## 2020-02-27 ENCOUNTER — Other Ambulatory Visit: Payer: Self-pay

## 2020-02-27 DIAGNOSIS — E785 Hyperlipidemia, unspecified: Secondary | ICD-10-CM | POA: Insufficient documentation

## 2020-02-27 DIAGNOSIS — M1990 Unspecified osteoarthritis, unspecified site: Secondary | ICD-10-CM | POA: Insufficient documentation

## 2020-02-27 DIAGNOSIS — F419 Anxiety disorder, unspecified: Secondary | ICD-10-CM | POA: Insufficient documentation

## 2020-02-27 DIAGNOSIS — M5412 Radiculopathy, cervical region: Secondary | ICD-10-CM | POA: Diagnosis not present

## 2020-03-01 ENCOUNTER — Other Ambulatory Visit: Payer: Self-pay

## 2020-03-01 ENCOUNTER — Ambulatory Visit (INDEPENDENT_AMBULATORY_CARE_PROVIDER_SITE_OTHER): Payer: BC Managed Care – PPO | Admitting: Cardiology

## 2020-03-01 ENCOUNTER — Encounter: Payer: Self-pay | Admitting: Cardiology

## 2020-03-01 VITALS — BP 138/80 | HR 74 | Ht 63.0 in | Wt 162.0 lb

## 2020-03-01 DIAGNOSIS — R002 Palpitations: Secondary | ICD-10-CM | POA: Diagnosis not present

## 2020-03-01 DIAGNOSIS — E782 Mixed hyperlipidemia: Secondary | ICD-10-CM | POA: Diagnosis not present

## 2020-03-01 DIAGNOSIS — I10 Essential (primary) hypertension: Secondary | ICD-10-CM

## 2020-03-01 NOTE — Patient Instructions (Signed)

## 2020-03-01 NOTE — Progress Notes (Signed)
Cardiology Office Note:    Date:  03/01/2020   ID:  Caitlin Rogers, DOB 1955-08-09, MRN [id number]  PCP:  Fanny Bien, MD  Cardiologist:  Jenean Lindau, MD   Referring MD: Fanny Bien, MD    ASSESSMENT:    1. Essential hypertension   2. Mixed hyperlipidemia   3. Palpitations    PLAN:    In order of problems listed above:  1. Primary prevention stressed with the patient.  Importance of compliance with diet medication stressed and she vocalized understanding.  Her blood pressure is stable. 2. Essential hypertension: Blood pressure stable.  Diet was emphasized.  Importance of regular exercise stressed she was advised to walk at least half an hour a day 5 days a week and she promises to do so. 3. Chest discomfort: Stress test was unremarkable and the details are mentioned below.  So was the monitoring. 4. Elevated LFTs: We have stopped statin therapy and the patient is seen her primary care physician for this.  There is history of hepatitis.  Once LFTs are stable based on benefits risks she might be initiated on a lower dose if felt necessary.  I discussed this with her. 5. Patient will be seen in follow-up appointment in 6 months or earlier if the patient has any concerns    Medication Adjustments/Labs and Tests Ordered: Current medicines are reviewed at length with the patient today.  Concerns regarding medicines are outlined above.  No orders of the defined types were placed in this encounter.  No orders of the defined types were placed in this encounter.    No chief complaint on file.    History of Present Illness:    Caitlin Rogers is a 64 y.o. female.  Patient has past medical history of essential hypertension and dyslipidemia.  She was evaluated for chest discomfort and palpitations.  She denies any problems at this time now.  She takes care of activities of daily living.  She has stopped her statin because of mild elevation in LFTs.  She mentions to me  that she has had history of hepatitis in the past.  At the time of my evaluation, the patient is alert awake oriented and in no distress.  Past Medical History:  Diagnosis Date  . Anxiety   . Arthritis   . Atypical chest pain 11/11/2016  . Cardiac murmur 01/19/2020  . Chest discomfort 01/19/2020  . Essential hypertension 11/11/2016  . GERD (gastroesophageal reflux disease)   . GERD (gastroesophageal reflux disease) 11/11/2016  . Hyperlipemia   . Hyperlipidemia 11/11/2016  . Hypertension   . Numbness 01/02/2020  . Palpitations 11/11/2016    Past Surgical History:  Procedure Laterality Date  . APPENDECTOMY    . BUNIONECTOMY     bilat  . CERVICAL SPINE SURGERY    . CESAREAN SECTION     in toe  . COLONOSCOPY    . MOUTH SURGERY  2019   implant with crown  . POLYPECTOMY    . TONSILLECTOMY    . TUBAL LIGATION    . UPPER GASTROINTESTINAL ENDOSCOPY    . WISDOM TOOTH EXTRACTION      Current Medications: Current Meds  Medication Sig  . ALPRAZolam (XANAX) 0.5 MG tablet Take 0.5 mg by mouth 2 (two) times daily as needed.   . metoprolol succinate (TOPROL-XL) 25 MG 24 hr tablet Take 25 mg by mouth daily.   . Olmesartan-amLODIPine-HCTZ 40-10-12.5 MG TABS Take 0.5 tablets by mouth daily.  Marland Kitchen omeprazole (PRILOSEC)  40 MG capsule TAKE ONE CAPSULE BY MOUTH DAILY  . sertraline (ZOLOFT) 100 MG tablet Take 200 mg by mouth every morning.     Allergies:   Patient has no known allergies.   Social History   Socioeconomic History  . Marital status: Married    Spouse name: Not on file  . Number of children: Not on file  . Years of education: Not on file  . Highest education level: Not on file  Occupational History  . Not on file  Tobacco Use  . Smoking status: Former Research scientist (life sciences)  . Smokeless tobacco: Never Used  Vaping Use  . Vaping Use: Never used  Substance and Sexual Activity  . Alcohol use: Yes    Alcohol/week: 3.0 standard drinks    Types: 3 Glasses of wine per week    Comment: and some  liquor  . Drug use: No  . Sexual activity: Not on file  Other Topics Concern  . Not on file  Social History Narrative  . Not on file   Social Determinants of Health   Financial Resource Strain:   . Difficulty of Paying Living Expenses:   Food Insecurity:   . Worried About Charity fundraiser in the Last Year:   . Arboriculturist in the Last Year:   Transportation Needs:   . Film/video editor (Medical):   Marland Kitchen Lack of Transportation (Non-Medical):   Physical Activity:   . Days of Exercise per Week:   . Minutes of Exercise per Session:   Stress:   . Feeling of Stress :   Social Connections:   . Frequency of Communication with Friends and Family:   . Frequency of Social Gatherings with Friends and Family:   . Attends Religious Services:   . Active Member of Clubs or Organizations:   . Attends Archivist Meetings:   Marland Kitchen Marital Status:      Family History: The patient's family history includes CAD in her brother; Colon cancer in her paternal aunt; Diabetes in her mother; Diabetes Mellitus II in her mother; Heart attack in her paternal grandfather; Heart disease in her father; Hypertension in her father and mother; Stroke in her brother and father. There is no history of Colon polyps, Esophageal cancer, Rectal cancer, or Stomach cancer.  ROS:   Please see the history of present illness.    All other systems reviewed and are negative.  EKGs/Labs/Other Studies Reviewed:    The following studies were reviewed today: EVENT MONITOR REPORT:   Patient was monitored from 01/19/2020 to 02/02/2020. Indication:                    Palpitations Ordering physician:  Jenean Lindau, MD  Referring physician:        Jenean Lindau, MD    Baseline rhythm: Sinus  Minimum heart rate: 58 BPM.  Average heart rate: 80 BPM.  Maximal heart rate 116 BPM.  Atrial arrhythmia: Rare PACs  Ventricular arrhythmia: Rare PVCs  Conduction abnormality: None  significant  Symptoms: None significant   Conclusion:  Unremarkable event monitor  Interpreting  cardiologist: Jenean Lindau, MD  Date: 02/20/2020 3:10 PM  Study Highlights    Nuclear stress EF: 90%.  There was no ST segment deviation noted during stress.  The study is normal.  This is a low risk study.  The left ventricular ejection fraction is hyperdynamic (>65%).   Normal resting and stress perfusion. No ischemia or infarction EF Estimated  at 90% May not be that high but is normal    IMPRESSIONS    1. Global longitudinal strain is -18.9%. Left ventricular ejection  fraction, by estimation, is 60 to 65%. The left ventricle has normal  function. The left ventricle has no regional wall motion abnormalities.  There is mild left ventricular hypertrophy.  Left ventricular diastolic parameters were normal.  2. Right ventricular systolic function is normal. The right ventricular  size is normal. There is mildly elevated pulmonary artery systolic  pressure.  3. The mitral valve is normal in structure. Mild mitral valve  regurgitation.  4. The aortic valve is tricuspid. Aortic valve regurgitation is trivial.  Mild aortic valve sclerosis is present, with no evidence of aortic valve  stenosis.       Recent Labs: 01/02/2020: BUN 17; Creatinine, Ser 1.10; Hemoglobin 12.2; Platelets 161; Potassium 3.7; Sodium 136 02/16/2020: ALT 53; TSH 2.970  Recent Lipid Panel    Component Value Date/Time   CHOL 226 (H) 02/16/2020 0000   TRIG 48 02/16/2020 0000   HDL 137 02/16/2020 0000   CHOLHDL 1.6 02/16/2020 0000   LDLCALC 80 02/16/2020 0000    Physical Exam:    VS:  BP 138/80   Pulse 74   Ht 5\' 3"  (1.6 m)   Wt 162 lb (73.5 kg)   SpO2 99%   BMI 28.70 kg/m     Wt Readings from Last 3 Encounters:  03/01/20 162 lb (73.5 kg)  02/16/20 165 lb (74.8 kg)  01/19/20 165 lb (74.8 kg)     GEN: Patient is in no acute distress HEENT: Normal NECK: No JVD; No  carotid bruits LYMPHATICS: No lymphadenopathy CARDIAC: Hear sounds regular, 2/6 systolic murmur at the apex. RESPIRATORY:  Clear to auscultation without rales, wheezing or rhonchi  ABDOMEN: Soft, non-tender, non-distended MUSCULOSKELETAL:  No edema; No deformity  SKIN: Warm and dry NEUROLOGIC:  Alert and oriented x 3 PSYCHIATRIC:  Normal affect   Signed, Jenean Lindau, MD  03/01/2020 1:36 PM    [facility]

## 2020-03-11 DIAGNOSIS — G4700 Insomnia, unspecified: Secondary | ICD-10-CM | POA: Diagnosis not present

## 2020-03-11 DIAGNOSIS — F331 Major depressive disorder, recurrent, moderate: Secondary | ICD-10-CM | POA: Diagnosis not present

## 2020-03-11 DIAGNOSIS — I10 Essential (primary) hypertension: Secondary | ICD-10-CM | POA: Diagnosis not present

## 2020-03-11 DIAGNOSIS — F411 Generalized anxiety disorder: Secondary | ICD-10-CM | POA: Diagnosis not present

## 2020-03-12 DIAGNOSIS — M5412 Radiculopathy, cervical region: Secondary | ICD-10-CM | POA: Diagnosis not present

## 2020-03-19 ENCOUNTER — Ambulatory Visit: Payer: BC Managed Care – PPO | Admitting: Cardiology

## 2020-04-02 DIAGNOSIS — M5412 Radiculopathy, cervical region: Secondary | ICD-10-CM | POA: Diagnosis not present

## 2020-04-05 ENCOUNTER — Ambulatory Visit (INDEPENDENT_AMBULATORY_CARE_PROVIDER_SITE_OTHER): Payer: BC Managed Care – PPO | Admitting: Gastroenterology

## 2020-04-05 ENCOUNTER — Encounter: Payer: Self-pay | Admitting: Gastroenterology

## 2020-04-05 VITALS — BP 104/60 | HR 79 | Ht 63.0 in | Wt 162.1 lb

## 2020-04-05 DIAGNOSIS — K2100 Gastro-esophageal reflux disease with esophagitis, without bleeding: Secondary | ICD-10-CM

## 2020-04-05 DIAGNOSIS — R7989 Other specified abnormal findings of blood chemistry: Secondary | ICD-10-CM | POA: Diagnosis not present

## 2020-04-05 MED ORDER — OMEPRAZOLE 40 MG PO CPDR: 40.0000 mg | DELAYED_RELEASE_CAPSULE | Freq: Every day | ORAL | 3 refills | Status: DC

## 2020-04-05 NOTE — Progress Notes (Signed)
    History of Present Illness: This is a 64 year old female with GERD, globus sensation Elevated LFTs noted as below.  HBsAb positive.  Patient relates that she had hepatitis A around age 30 or 12 and hepatitis B around age 48.  She feels she recovered from both illnesses.  No family history of liver disease.  No recent medication changes.  July 2021: AST=72, ALT=52 January 2021: AST=44  ALT=32 (upper limit of normal)  September 2020: AST=44  ALT=36  January 2018: LFTs normal   EGD 09/2018 - LA Grade B reflux esophagitis. - Medium-sized hiatal hernia. - A few gastric polyps. Biopsied. - Normal duodenal bulb and second portion of the duodenum.  Current Medications, Allergies, Past Medical History, Past Surgical History, Family History and Social History were reviewed in [employer] record.   Physical Exam: General: Well developed, well nourished, no acute distress Head: Normocephalic and atraumatic Eyes:  sclerae anicteric, EOMI Ears: Normal auditory acuity Mouth: Not examined, mask on during Covid-19 pandemic Lungs: Clear throughout to auscultation Heart: Regular rate and rhythm; no murmurs, rubs or bruits Abdomen: Soft, non tender and non distended. No masses, hepatosplenomegaly or hernias noted. Normal Bowel sounds Rectal: Not done Musculoskeletal: Symmetrical with no gross deformities  Pulses:  Normal pulses noted Extremities: No clubbing, cyanosis, edema or deformities noted Neurological: Alert oriented x 4, grossly nonfocal Psychological:  Alert and cooperative. Normal mood and affect   Assessment and Recommendations:  1. GERD with LA Class B erosive esophagitis.  Continue omeprazole 40 mg po qd long term.  Follow Antifreflux measures long term.   2. Personal history of adenomatous colon polyp and family history of colon cancer. Colonoscopy in 02/2021.  3. Elevated transaminases since at least September 2020. History of Hep A age 57 and Hep B age  39.  HBsAb positive.  Blood work from her PCP obtained after she left the office.  Will advise RUQ [place] and standard hepatitis serologies excluding HBsAb for further evaluation. REV in 6-8 weeks.

## 2020-04-05 NOTE — Patient Instructions (Addendum)
We have sent the following medications to your pharmacy for you to pick up at your convenience: omeprazole.   Your provider has requested that you go to the basement level for lab work before leaving today. Press "B" on the elevator. The lab is located at the first door on the left as you exit the elevator.  You have been scheduled for an abdominal ultrasound at [facility] (1st floor of hospital) on 04/09/20 at 9am. Please arrive 15 minutes prior to your appointment for registration. Make certain not to have anything to eat or drink 6 hours prior to your appointment. Should you need to reschedule your appointment, please contact radiology at [phone]. This test typically takes about 30 minutes to perform.  Thank you for choosing me and [facility].  Pricilla Riffle. Dagoberto Ligas., MD., Marval Regal

## 2020-04-06 ENCOUNTER — Telehealth: Payer: Self-pay

## 2020-04-06 ENCOUNTER — Other Ambulatory Visit: Payer: Self-pay

## 2020-04-06 NOTE — Telephone Encounter (Signed)
-----   Message from Ladene Artist, MD sent at 04/05/2020  5:15 PM EDT ----- Please contact the patient and let her know we received blood work from her PCP after she left the office yesterday. See my office note that outlines what we received.  Schedule RUQ [place] and send all standard hepatitis serologies, excluding HBsAb, for further evaluation of elevated transaminases.

## 2020-04-06 NOTE — Progress Notes (Signed)
u

## 2020-04-06 NOTE — Telephone Encounter (Signed)
Ultrasound scheduled for 04/09/20 at [place] at [facility] and labs put in [place]. Patient notified and verbalized understanding.

## 2020-04-06 NOTE — Addendum Note (Signed)
Addended by: Dorisann Frames L on: 04/06/2020 10:09 AM   Modules accepted: Orders

## 2020-04-09 ENCOUNTER — Other Ambulatory Visit: Payer: Self-pay

## 2020-04-09 ENCOUNTER — Ambulatory Visit (HOSPITAL_COMMUNITY)
Admission: RE | Admit: 2020-04-09 | Discharge: 2020-04-09 | Disposition: A | Discharge status: Home / Self Care | Payer: BC Managed Care – PPO | Source: Ambulatory Visit | Attending: Gastroenterology | Admitting: Gastroenterology

## 2020-04-09 DIAGNOSIS — K2100 Gastro-esophageal reflux disease with esophagitis, without bleeding: Secondary | ICD-10-CM | POA: Diagnosis not present

## 2020-04-09 DIAGNOSIS — R7989 Other specified abnormal findings of blood chemistry: Secondary | ICD-10-CM | POA: Diagnosis not present

## 2020-04-16 ENCOUNTER — Other Ambulatory Visit: Payer: BC Managed Care – PPO

## 2020-04-16 DIAGNOSIS — R7989 Other specified abnormal findings of blood chemistry: Secondary | ICD-10-CM

## 2020-04-16 DIAGNOSIS — K2100 Gastro-esophageal reflux disease with esophagitis, without bleeding: Secondary | ICD-10-CM

## 2020-04-16 NOTE — Addendum Note (Signed)
Addended by: Eddie North C on: 04/16/2020 11:44 AM   Modules accepted: Orders

## 2020-04-21 LAB — ANGIOTENSIN CONVERTING ENZYME: Angiotensin-Converting Enzyme: 36 U/L (ref 9–67)

## 2020-04-21 LAB — HEPATITIS C ANTIBODY
Hepatitis C Ab: NONREACTIVE
SIGNAL TO CUT-OFF: 0.02 (ref <1.00)

## 2020-04-21 LAB — CERULOPLASMIN: Ceruloplasmin: 34 mg/dL (ref 18–53)

## 2020-04-21 LAB — MITOCHONDRIAL ANTIBODIES: Mitochondrial M2 Ab, IgG: <=20 U

## 2020-04-21 LAB — ANTI-NUCLEAR AB-TITER (ANA TITER): ANA Titer 1: 1:40 {titer} — ABNORMAL HIGH

## 2020-04-21 LAB — HEPATITIS B SURFACE ANTIGEN: Hepatitis B Surface Ag: NONREACTIVE

## 2020-04-21 LAB — ANA: Anti Nuclear Antibody (ANA): POSITIVE — AB

## 2020-04-21 LAB — ANTI-SMOOTH MUSCLE ANTIBODY, IGG: Actin (Smooth Muscle) Antibody (IGG): <20 U (ref <20)

## 2020-04-21 LAB — ALPHA-1-ANTITRYPSIN: A-1 Antitrypsin, Ser: 149 mg/dL (ref 83–199)

## 2020-04-23 ENCOUNTER — Other Ambulatory Visit: Payer: Self-pay

## 2020-04-23 DIAGNOSIS — R7989 Other specified abnormal findings of blood chemistry: Secondary | ICD-10-CM

## 2020-05-05 DIAGNOSIS — E782 Mixed hyperlipidemia: Secondary | ICD-10-CM | POA: Diagnosis not present

## 2020-05-05 DIAGNOSIS — I10 Essential (primary) hypertension: Secondary | ICD-10-CM | POA: Diagnosis not present

## 2020-05-07 DIAGNOSIS — E782 Mixed hyperlipidemia: Secondary | ICD-10-CM | POA: Diagnosis not present

## 2020-05-07 DIAGNOSIS — I10 Essential (primary) hypertension: Secondary | ICD-10-CM | POA: Diagnosis not present

## 2020-05-07 DIAGNOSIS — R7989 Other specified abnormal findings of blood chemistry: Secondary | ICD-10-CM | POA: Diagnosis not present

## 2020-05-14 DIAGNOSIS — Z23 Encounter for immunization: Secondary | ICD-10-CM | POA: Diagnosis not present

## 2020-07-06 ENCOUNTER — Ambulatory Visit: Payer: BC Managed Care – PPO | Admitting: Family Medicine

## 2020-07-06 ENCOUNTER — Other Ambulatory Visit: Payer: Self-pay

## 2020-07-06 ENCOUNTER — Encounter: Payer: Self-pay | Admitting: Family Medicine

## 2020-07-06 VITALS — BP 140/80 | HR 66 | Temp 96.3°F | Ht 61.75 in | Wt 160.8 lb

## 2020-07-06 DIAGNOSIS — I10 Essential (primary) hypertension: Secondary | ICD-10-CM | POA: Diagnosis not present

## 2020-07-06 DIAGNOSIS — R002 Palpitations: Secondary | ICD-10-CM | POA: Diagnosis not present

## 2020-07-06 DIAGNOSIS — F321 Major depressive disorder, single episode, moderate: Secondary | ICD-10-CM

## 2020-07-06 DIAGNOSIS — F419 Anxiety disorder, unspecified: Secondary | ICD-10-CM

## 2020-07-06 DIAGNOSIS — K2100 Gastro-esophageal reflux disease with esophagitis, without bleeding: Secondary | ICD-10-CM

## 2020-07-06 DIAGNOSIS — F320 Major depressive disorder, single episode, mild: Secondary | ICD-10-CM | POA: Insufficient documentation

## 2020-07-06 DIAGNOSIS — F325 Major depressive disorder, single episode, in full remission: Secondary | ICD-10-CM | POA: Insufficient documentation

## 2020-07-06 HISTORY — DX: Major depressive disorder, single episode, moderate: F32.1

## 2020-07-06 NOTE — Assessment & Plan Note (Signed)
Encouraged therapy. Currently taking xanax 3-4 times per week which is acceptable but if increasing will likely change zoloft. Cont prn xanax 0.5 mg

## 2020-07-06 NOTE — Assessment & Plan Note (Signed)
Stable. Cont metoprolol 25 mg

## 2020-07-06 NOTE — Assessment & Plan Note (Signed)
Not well controlled at this time with anxiety. Advised therapy and will plan to start. Follow-up in 3 months or sooner if wanting to try alternative medication

## 2020-07-06 NOTE — Assessment & Plan Note (Signed)
Follows with GI. Cont omeprazole.

## 2020-07-06 NOTE — Assessment & Plan Note (Addendum)
Slighly elevated. Cont metoprolol 25 mg daily. Cont olmesartan-amlodipine-hctz 40-10-12.5 (1/2 tablet daily). Home monitoring with update in 2 weeks

## 2020-07-06 NOTE — Progress Notes (Signed)
Subjective:     Caitlin Rogers is a 64 y.o. female presenting for Establish Care     HPI   #Elevated LFTs - seeing gastroenterology - not sure what is going on but hasn't heard anything  #HTN - taking 1/2 tablet of her combination pill as she was getting low bp - has not checked recently - no palpations - no cp, dizziness  #Depression - taking zoloft - not sure if this is helping - also on xanax - takes this 3-4 times a week for anxiety - she feels real "shaky" - xanax helps  - endorses nervousness and anxiety when getting the shaky symptoms - was on something before the zoloft - but no sure what - was on wellbutrin    Review of Systems   Social History   Tobacco Use  Smoking Status Former Smoker  Smokeless Tobacco Never Used        Objective:    BP Readings from Last 3 Encounters:  07/06/20 140/80  04/05/20 104/60  03/01/20 138/80   Wt Readings from Last 3 Encounters:  07/06/20 160 lb 12 oz (72.9 kg)  04/05/20 162 lb 2 oz (73.5 kg)  03/01/20 162 lb (73.5 kg)    BP 140/80   Pulse 66   Temp (!) 96.3 F (35.7 C) (Temporal)   Ht 5' 1.75" (1.568 m)   Wt 160 lb 12 oz (72.9 kg)   SpO2 95%   BMI 29.64 kg/m    Physical Exam Constitutional:      General: She is not in acute distress.    Appearance: She is well-developed. She is not diaphoretic.  HENT:     Right Ear: External ear normal.     Left Ear: External ear normal.  Eyes:     Conjunctiva/sclera: Conjunctivae normal.  Cardiovascular:     Rate and Rhythm: Normal rate and regular rhythm.     Heart sounds: No murmur heard.   Pulmonary:     Effort: Pulmonary effort is normal. No respiratory distress.     Breath sounds: Normal breath sounds. No wheezing.  Musculoskeletal:     Cervical back: Neck supple.  Skin:    General: Skin is warm and dry.     Capillary Refill: Capillary refill takes less than 2 seconds.  Neurological:     Mental Status: She is alert. Mental status is at baseline.   Psychiatric:        Mood and Affect: Mood normal.        Behavior: Behavior normal.     The ASCVD Risk score Mikey Bussing DC Jr., et al., 2013) failed to calculate for the following reasons:   The valid HDL cholesterol range is 20 to 100 mg/dL       Assessment & Plan:   Problem List Items Addressed This Visit      Cardiovascular and Mediastinum   Essential hypertension - Primary    Slighly elevated. Cont metoprolol 25 mg daily. Cont olmesartan-amlodipine-hctz 40-10-12.5 (1/2 tablet daily). Home monitoring with update in 2 weeks        Digestive   GERD (gastroesophageal reflux disease)    Follows with GI. Cont omeprazole.         Other   Palpitations    Stable. Cont metoprolol 25 mg      Anxiety    Encouraged therapy. Currently taking xanax 3-4 times per week which is acceptable but if increasing will likely change zoloft. Cont prn xanax 0.5 mg  Current moderate episode of major depressive disorder without prior episode ([place])    Not well controlled at this time with anxiety. Advised therapy and will plan to start. Follow-up in 3 months or sooner if wanting to try alternative medication          Return in about 3 months (around 10/04/2020).  Lesleigh Noe, MD  This visit occurred during the SARS-CoV-2 public health emergency.  Safety protocols were in place, including screening questions prior to the visit, additional usage of staff PPE, and extensive cleaning of exam room while observing appropriate contact time as indicated for disinfecting solutions.

## 2020-07-06 NOTE — Assessment & Plan Note (Signed)
>>  ASSESSMENT AND PLAN FOR DEPRESSION, MAJOR, SINGLE EPISODE, COMPLETE REMISSION (HCC) WRITTEN ON 07/06/2020  2:20 PM BY CODY, JESSICA R, MD  Not well controlled at this time with anxiety. Advised therapy and will plan to start. Follow-up in 3 months or sooner if wanting to try alternative medication

## 2020-07-06 NOTE — Patient Instructions (Signed)
Elevated liver tests - work to reduce alcohol use  High blood pressure - Check blood pressure at home - Update me in 2 weeks   To check your blood pressure 1) Sit in a quiet and relaxed place for 5 minutes 2) Make sure your feet are flat on the ground 3) Consider checking first thing in the morning   Normal blood pressure is less than 140/90 Ideally you blood pressure should be around 120/80   Depression  - start therapy - phone number for [place] handout - continue the zoloft (Sertraline) and the xanax as needed - return in 3 months or sooner if you want to try different medication

## 2020-09-02 ENCOUNTER — Telehealth: Payer: Self-pay | Admitting: Cardiology

## 2020-09-02 DIAGNOSIS — E782 Mixed hyperlipidemia: Secondary | ICD-10-CM

## 2020-09-02 DIAGNOSIS — I10 Essential (primary) hypertension: Secondary | ICD-10-CM

## 2020-09-02 DIAGNOSIS — R002 Palpitations: Secondary | ICD-10-CM

## 2020-09-02 NOTE — Telephone Encounter (Signed)
New Message"      Pt wants to know if she needs lab work before her appt on 09-07-20?

## 2020-09-02 NOTE — Telephone Encounter (Signed)
Results reviewed with pt as per Dr. Revankar's note.  Pt verbalized understanding and had no additional questions.   

## 2020-09-02 NOTE — Telephone Encounter (Signed)
Yes all blood work please

## 2020-09-03 DIAGNOSIS — R002 Palpitations: Secondary | ICD-10-CM | POA: Diagnosis not present

## 2020-09-03 DIAGNOSIS — E782 Mixed hyperlipidemia: Secondary | ICD-10-CM | POA: Diagnosis not present

## 2020-09-03 DIAGNOSIS — I10 Essential (primary) hypertension: Secondary | ICD-10-CM | POA: Diagnosis not present

## 2020-09-04 LAB — CBC WITH DIFFERENTIAL/PLATELET
Basophils Absolute: 0 10*3/uL (ref 0.0–0.2)
Basos: 0 %
EOS (ABSOLUTE): 0.1 10*3/uL (ref 0.0–0.4)
Eos: 1 %
Hematocrit: 41.4 % (ref 34.0–46.6)
Hemoglobin: 14.5 g/dL (ref 11.1–15.9)
Immature Grans (Abs): 0 10*3/uL (ref 0.0–0.1)
Immature Granulocytes: 0 %
Lymphocytes Absolute: 0.6 10*3/uL — ABNORMAL LOW (ref 0.7–3.1)
Lymphs: 6 %
MCH: 33 pg (ref 26.6–33.0)
MCHC: 35 g/dL (ref 31.5–35.7)
MCV: 94 fL (ref 79–97)
Monocytes Absolute: 0.5 10*3/uL (ref 0.1–0.9)
Monocytes: 5 %
Neutrophils Absolute: 8.1 10*3/uL — ABNORMAL HIGH (ref 1.4–7.0)
Neutrophils: 88 %
Platelets: 130 10*3/uL — ABNORMAL LOW (ref 150–450)
RBC: 4.39 x10E6/uL (ref 3.77–5.28)
RDW: 13.1 % (ref 11.7–15.4)
WBC: 9.2 10*3/uL (ref 3.4–10.8)

## 2020-09-04 LAB — HEPATIC FUNCTION PANEL
ALT: 22 IU/L (ref 0–32)
AST: 39 IU/L (ref 0–40)
Albumin: 4.7 g/dL (ref 3.8–4.8)
Alkaline Phosphatase: 91 IU/L (ref 44–121)
Bilirubin Total: 0.4 mg/dL (ref 0.0–1.2)
Bilirubin, Direct: 0.14 mg/dL (ref 0.00–0.40)
Total Protein: 7.3 g/dL (ref 6.0–8.5)

## 2020-09-04 LAB — LIPID PANEL
Chol/HDL Ratio: 2.5 ratio (ref 0.0–4.4)
Cholesterol, Total: 338 mg/dL — ABNORMAL HIGH (ref 100–199)
HDL: 133 mg/dL (ref >39)
LDL Chol Calc (NIH): 194 mg/dL — ABNORMAL HIGH (ref 0–99)
Triglycerides: 80 mg/dL (ref 0–149)
VLDL Cholesterol Cal: 11 mg/dL (ref 5–40)

## 2020-09-04 LAB — TSH: TSH: 1.26 u[IU]/mL (ref 0.450–4.500)

## 2020-09-04 LAB — BASIC METABOLIC PANEL
BUN/Creatinine Ratio: 21 (ref 12–28)
BUN: 16 mg/dL (ref 8–27)
CO2: 21 mmol/L (ref 20–29)
Calcium: 9.7 mg/dL (ref 8.7–10.3)
Chloride: 95 mmol/L — ABNORMAL LOW (ref 96–106)
Creatinine, Ser: 0.75 mg/dL (ref 0.57–1.00)
GFR calc Af Amer: 98 mL/min/{1.73_m2} (ref >59)
GFR calc non Af Amer: 85 mL/min/{1.73_m2} (ref >59)
Glucose: 108 mg/dL — ABNORMAL HIGH (ref 65–99)
Potassium: 4.2 mmol/L (ref 3.5–5.2)
Sodium: 136 mmol/L (ref 134–144)

## 2020-09-06 DIAGNOSIS — I10 Essential (primary) hypertension: Secondary | ICD-10-CM | POA: Insufficient documentation

## 2020-09-06 DIAGNOSIS — H9319 Tinnitus, unspecified ear: Secondary | ICD-10-CM | POA: Insufficient documentation

## 2020-09-07 ENCOUNTER — Other Ambulatory Visit: Payer: Self-pay

## 2020-09-07 ENCOUNTER — Ambulatory Visit (INDEPENDENT_AMBULATORY_CARE_PROVIDER_SITE_OTHER): Payer: BC Managed Care – PPO | Admitting: Cardiology

## 2020-09-07 ENCOUNTER — Encounter: Payer: Self-pay | Admitting: Cardiology

## 2020-09-07 VITALS — BP 152/70 | HR 88 | Ht 63.0 in | Wt 164.1 lb

## 2020-09-07 DIAGNOSIS — R002 Palpitations: Secondary | ICD-10-CM

## 2020-09-07 DIAGNOSIS — R7989 Other specified abnormal findings of blood chemistry: Secondary | ICD-10-CM

## 2020-09-07 DIAGNOSIS — E782 Mixed hyperlipidemia: Secondary | ICD-10-CM

## 2020-09-07 DIAGNOSIS — I10 Essential (primary) hypertension: Secondary | ICD-10-CM | POA: Diagnosis not present

## 2020-09-07 NOTE — Progress Notes (Signed)
Cardiology Office Note:    Date:  09/07/2020   ID:  Caitlin Rogers, DOB 1956-02-06, MRN [id number]  PCP:  Lesleigh Noe, MD  Cardiologist:  Jenean Lindau, MD   Referring MD: Fanny Bien, MD    ASSESSMENT:    1. Mixed hyperlipidemia   2. Essential hypertension   3. Palpitations    PLAN:    In order of problems listed above:  1. Primary prevention stressed with the patient.  Importance of compliance with diet medication stressed and she vocalized understanding.  I told her to walk at least half an hour a day on a regular basis and she promises to do so.  Lifestyle modification issues such as meditation and weight-based initiatives... I encouraged her to pursue those. 2. Essential hypertension: Blood pressure stable.  Salt intake and above issues were discussed.  She tells me that her blood pressure is much better at home. 3. Mixed dyslipidemia: Diet was emphasized.  We will get a CT calcium scoring of her coronaries to understand her risks and consider benefits risks of lipid-lowering therapy.  I discussed this with her at length and she vocalized understanding and is agreeable. 4. Patient will be seen in follow-up appointment in 6 weeks or earlier if the patient has any concerns    Medication Adjustments/Labs and Tests Ordered: Current medicines are reviewed at length with the patient today.  Concerns regarding medicines are outlined above.  Orders Placed This Encounter  Procedures  . CT CARDIAC SCORING (SELF PAY ONLY)   No orders of the defined types were placed in this encounter.    No chief complaint on file.    History of Present Illness:    Caitlin Rogers is a 65 y.o. female.  Patient has past medical history of essential hypertension and marked dyslipidemia.  She is not on statin therapy because of elevated LFTs.  She has occasional palpitations but these are mostly resolved.  She is suffering from COVID-19 infection and subsequently has had significant  issues with ringing in her ears.  This bothers her and affects her lifestyle.  At the time of my evaluation, the patient is alert awake oriented and in no distress.  Past Medical History:  Diagnosis Date  . Anxiety   . Arthritis   . Atypical chest pain 11/11/2016  . Cardiac murmur 01/19/2020  . Chest discomfort 01/19/2020  . COVID-19 06/2019  . Current moderate episode of major depressive disorder without prior episode ([place]) 07/06/2020  . Essential hypertension 11/11/2016  . GERD (gastroesophageal reflux disease) 11/11/2016  . Hyperlipemia   . Hyperlipidemia 11/11/2016  . Hypertension   . Numbness 01/02/2020  . Palpitations 11/11/2016  . Ringing in the ears    post covid symptom    Past Surgical History:  Procedure Laterality Date  . APPENDECTOMY    . BUNIONECTOMY     bilat  . CERVICAL SPINE SURGERY    . CESAREAN SECTION     in toe  . COLONOSCOPY    . MOUTH SURGERY  2019   implant with crown  . POLYPECTOMY    . TONSILLECTOMY    . TUBAL LIGATION    . UPPER GASTROINTESTINAL ENDOSCOPY    . WISDOM TOOTH EXTRACTION      Current Medications: Current Meds  Medication Sig  . ALPRAZolam (XANAX) 0.5 MG tablet Take 0.5 mg by mouth 2 (two) times daily as needed for anxiety.  . metoprolol succinate (TOPROL-XL) 25 MG 24 hr tablet Take 25 mg by mouth  daily.   . Olmesartan-amLODIPine-HCTZ 40-10-12.5 MG TABS Take 0.5 tablets by mouth daily.  Marland Kitchen omeprazole (PRILOSEC) 40 MG capsule Take 1 capsule (40 mg total) by mouth daily.  . sertraline (ZOLOFT) 100 MG tablet Take 100 mg by mouth daily.     Allergies:   Codeine   Social History   Socioeconomic History  . Marital status: Married    Spouse name: Louis  . Number of children: 2  . Years of education: Some college  . Highest education level: Not on file  Occupational History  . Not on file  Tobacco Use  . Smoking status: Former Research scientist (life sciences)  . Smokeless tobacco: Never Used  Vaping Use  . Vaping Use: Never used  Substance and Sexual  Activity  . Alcohol use: Yes    Comment: 1 glass of wine, and 2 oz of [place]   . Drug use: No  . Sexual activity: Yes    Birth control/protection: Post-menopausal  Other Topics Concern  . 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) and Judson Roch ([place])       Enjoys: nothing currently      Exercise: not currently   Diet: pretty good, limits sweets, tries to eat healthy food, leftovers for lunch      Safety   Seat belts: Yes    Guns: Yes  and secure   Safe in relationships: Yes    Social Determinants of Radio broadcast assistant Strain: Not on file  Food Insecurity: Not on file  Transportation Needs: Not on file  Physical Activity: Not on file  Stress: Not on file  Social Connections: Not on file     Family History: The patient's family history includes CAD in her brother; Colon cancer in her paternal aunt; Diabetes Mellitus II in her mother; Heart attack in her paternal grandfather; Heart disease in her brother and father; Hypertension in her father and mother; Stroke in her brother and father. There is no history of Colon polyps, Esophageal cancer, Rectal cancer, or Stomach cancer.  ROS:   Please see the history of present illness.    All other systems reviewed and are negative.  EKGs/Labs/Other Studies Reviewed:    The following studies were reviewed today: I discussed my findings with the patient at length   Recent Labs: 09/03/2020: ALT 22; BUN 16; Creatinine, Ser 0.75; Hemoglobin 14.5; Platelets 130; Potassium 4.2; Sodium 136; TSH 1.260  Recent Lipid Panel    Component Value Date/Time   CHOL 338 (H) 09/03/2020 0955   TRIG 80 09/03/2020 0955   HDL 133 09/03/2020 0955   CHOLHDL 2.5 09/03/2020 0955   LDLCALC 194 (H) 09/03/2020 0955    Physical Exam:    VS:  BP (!) 152/70   Pulse 88   Ht 5\' 3"   (1.6 m)   Wt 164 lb 1.9 oz (74.4 kg)   SpO2 97%   BMI 29.07 kg/m     Wt Readings from Last 3 Encounters:  09/07/20 164 lb 1.9 oz (74.4 kg)  07/06/20 160 lb 12 oz (72.9 kg)  04/05/20 162 lb 2 oz (73.5 kg)     GEN: Patient is in no acute distress HEENT: Normal NECK: No JVD; No carotid bruits LYMPHATICS: No lymphadenopathy CARDIAC: Hear sounds regular, 2/6 systolic murmur at  the apex. RESPIRATORY:  Clear to auscultation without rales, wheezing or rhonchi  ABDOMEN: Soft, non-tender, non-distended MUSCULOSKELETAL:  No edema; No deformity  SKIN: Warm and dry NEUROLOGIC:  Alert and oriented x 3 PSYCHIATRIC:  Normal affect   Signed, Jenean Lindau, MD  09/07/2020 2:03 PM    [place]

## 2020-09-07 NOTE — Patient Instructions (Signed)
Medication Instructions:  No medication changes. *If you need a refill on your cardiac medications before your next appointment, please call your pharmacy*   Lab Work: None ordered If you have labs (blood work) drawn today and your tests are completely normal, you will receive your results only by: Marland Kitchen MyChart Message (if you have MyChart) OR . A paper copy in the mail If you have any lab test that is abnormal or we need to change your treatment, we will call you to review the results.   Testing/Procedures: We will order CT coronary calcium score. It will cost $99.00 and is not covered by insurance.  Please call [phone] to schedule.   [facility]  [address], [place] [address]    Follow-Up: At [facility], you and your health needs are our priority.  As part of our continuing mission to provide you with exceptional heart care, we have created designated Provider Care Teams.  These Care Teams include your primary Cardiologist (physician) and Advanced Practice Providers (APPs -  Physician Assistants and Nurse Practitioners) who all work together to provide you with the care you need, when you need it.  We recommend signing up for the patient portal called "MyChart".  Sign up information is provided on this After Visit Summary.  MyChart is used to connect with patients for Virtual Visits (Telemedicine).  Patients are able to view lab/test results, encounter notes, upcoming appointments, etc.  Non-urgent messages can be sent to your provider as well.   To learn more about what you can do with MyChart, go to [email or web address].    Your next appointment:   1 month(s)  The format for your next appointment:   In Person  Provider:   Jyl Heinz, MD   Other Instructions  Coronary Calcium Scan A coronary calcium scan is an imaging test used to look for deposits of plaque in the inner lining of the blood vessels of the heart (coronary arteries).  Plaque is made up of calcium, protein, and fatty substances. These deposits of plaque can partly clog and narrow the coronary arteries without producing any symptoms or warning signs. This puts a person at risk for a heart attack. This test is recommended for people who are at moderate risk for heart disease. The test can find plaque deposits before symptoms develop. Tell a health care provider about:  Any allergies you have.  All medicines you are taking, including vitamins, herbs, eye drops, creams, and over-the-counter medicines.  Any problems you or family members have had with anesthetic medicines.  Any blood disorders you have.  Any surgeries you have had.  Any medical conditions you have.  Whether you are pregnant or may be pregnant. What are the risks? Generally, this is a safe procedure. However, problems may occur, including:  Harm to a pregnant woman and her unborn baby. This test involves the use of radiation. Radiation exposure can be dangerous to a pregnant woman and her unborn baby. If you are pregnant or think you may be pregnant, you should not have this procedure done.  Slight increase in the risk of cancer. This is because of the radiation involved in the test. What happens before the procedure? Ask your health care provider for any specific instructions on how to prepare for this procedure. You may be asked to avoid products that contain caffeine, tobacco, or nicotine for 4 hours before the procedure. What happens during the procedure?  You will undress and remove  any jewelry from your neck or chest.  You will put on a hospital gown.  Sticky electrodes will be placed on your chest. The electrodes will be connected to an electrocardiogram (ECG) machine to record a tracing of the electrical activity of your heart.  You will lie down on a curved bed that is attached to the [place].  You may be given medicine to slow down your heart rate so that clear pictures  can be created.  You will be moved into the CT scanner, and the CT scanner will take pictures of your heart. During this time, you will be asked to lie still and hold your breath for 2-3 seconds at a time while each picture of your heart is being taken. The procedure may vary among health care providers and hospitals.   What happens after the procedure?  You can get dressed.  You can return to your normal activities.  It is up to you to get the results of your procedure. Ask your health care provider, or the department that is doing the procedure, when your results will be ready. Summary  A coronary calcium scan is an imaging test used to look for deposits of plaque in the inner lining of the blood vessels of the heart (coronary arteries). Plaque is made up of calcium, protein, and fatty substances.  Generally, this is a safe procedure. Tell your health care provider if you are pregnant or may be pregnant.  Ask your health care provider for any specific instructions on how to prepare for this procedure.  A CT scanner will take pictures of your heart.  You can return to your normal activities after the scan is done. This information is not intended to replace advice given to you by your health care provider. Make sure you discuss any questions you have with your health care provider. Document Revised: 01/28/2019 Document Reviewed: 01/28/2019 Elsevier Patient Education  [place].

## 2020-09-29 ENCOUNTER — Encounter: Payer: Self-pay | Admitting: Family Medicine

## 2020-09-29 ENCOUNTER — Emergency Department (HOSPITAL_COMMUNITY): Payer: BC Managed Care – PPO

## 2020-09-29 ENCOUNTER — Ambulatory Visit (INDEPENDENT_AMBULATORY_CARE_PROVIDER_SITE_OTHER)
Admission: RE | Admit: 2020-09-29 | Discharge: 2020-09-29 | Disposition: A | Discharge status: Home / Self Care | Payer: Self-pay | Source: Ambulatory Visit | Attending: Cardiology | Admitting: Cardiology

## 2020-09-29 ENCOUNTER — Telehealth: Payer: Self-pay

## 2020-09-29 ENCOUNTER — Other Ambulatory Visit: Payer: Self-pay

## 2020-09-29 ENCOUNTER — Ambulatory Visit (HOSPITAL_COMMUNITY)
Admission: EM | Admit: 2020-09-29 | Discharge: 2020-09-29 | Disposition: A | Discharge status: Home / Self Care | Payer: BC Managed Care – PPO

## 2020-09-29 ENCOUNTER — Encounter (HOSPITAL_COMMUNITY): Payer: Self-pay

## 2020-09-29 ENCOUNTER — Emergency Department (HOSPITAL_COMMUNITY)
Admission: EM | Admit: 2020-09-29 | Discharge: 2020-09-29 | Disposition: A | Discharge status: Home / Self Care | Payer: BC Managed Care – PPO | Attending: Emergency Medicine | Admitting: Emergency Medicine

## 2020-09-29 DIAGNOSIS — R0602 Shortness of breath: Secondary | ICD-10-CM | POA: Insufficient documentation

## 2020-09-29 DIAGNOSIS — Z79899 Other long term (current) drug therapy: Secondary | ICD-10-CM | POA: Insufficient documentation

## 2020-09-29 DIAGNOSIS — K920 Hematemesis: Secondary | ICD-10-CM

## 2020-09-29 DIAGNOSIS — J189 Pneumonia, unspecified organism: Secondary | ICD-10-CM

## 2020-09-29 DIAGNOSIS — R059 Cough, unspecified: Secondary | ICD-10-CM | POA: Insufficient documentation

## 2020-09-29 DIAGNOSIS — E782 Mixed hyperlipidemia: Secondary | ICD-10-CM

## 2020-09-29 DIAGNOSIS — R042 Hemoptysis: Secondary | ICD-10-CM | POA: Insufficient documentation

## 2020-09-29 DIAGNOSIS — R519 Headache, unspecified: Secondary | ICD-10-CM | POA: Diagnosis not present

## 2020-09-29 DIAGNOSIS — I10 Essential (primary) hypertension: Secondary | ICD-10-CM | POA: Diagnosis not present

## 2020-09-29 DIAGNOSIS — R0789 Other chest pain: Secondary | ICD-10-CM | POA: Diagnosis not present

## 2020-09-29 DIAGNOSIS — Z20822 Contact with and (suspected) exposure to covid-19: Secondary | ICD-10-CM | POA: Insufficient documentation

## 2020-09-29 DIAGNOSIS — Z87891 Personal history of nicotine dependence: Secondary | ICD-10-CM | POA: Diagnosis not present

## 2020-09-29 DIAGNOSIS — I700 Atherosclerosis of aorta: Secondary | ICD-10-CM | POA: Insufficient documentation

## 2020-09-29 DIAGNOSIS — R079 Chest pain, unspecified: Secondary | ICD-10-CM

## 2020-09-29 HISTORY — DX: Atherosclerosis of aorta: I70.0

## 2020-09-29 LAB — RESP PANEL BY RT-PCR (FLU A&B, COVID) ARPGX2
Influenza A by PCR: NEGATIVE
Influenza B by PCR: NEGATIVE
SARS Coronavirus 2 by RT PCR: NEGATIVE

## 2020-09-29 LAB — BASIC METABOLIC PANEL
Anion gap: 10 (ref 5–15)
BUN: 15 mg/dL (ref 8–23)
CO2: 27 mmol/L (ref 22–32)
Calcium: 9.5 mg/dL (ref 8.9–10.3)
Chloride: 97 mmol/L — ABNORMAL LOW (ref 98–111)
Creatinine, Ser: 0.85 mg/dL (ref 0.44–1.00)
GFR, Estimated: >60 mL/min (ref >60)
Glucose, Bld: 121 mg/dL — ABNORMAL HIGH (ref 70–99)
Potassium: 4 mmol/L (ref 3.5–5.1)
Sodium: 134 mmol/L — ABNORMAL LOW (ref 135–145)

## 2020-09-29 LAB — CBC
HCT: 38.1 % (ref 36.0–46.0)
Hemoglobin: 12.9 g/dL (ref 12.0–15.0)
MCH: 32.7 pg (ref 26.0–34.0)
MCHC: 33.9 g/dL (ref 30.0–36.0)
MCV: 96.7 fL (ref 80.0–100.0)
Platelets: 145 10*3/uL — ABNORMAL LOW (ref 150–400)
RBC: 3.94 MIL/uL (ref 3.87–5.11)
RDW: 12.7 % (ref 11.5–15.5)
WBC: 10.7 10*3/uL — ABNORMAL HIGH (ref 4.0–10.5)
nRBC: 0 % (ref 0.0–0.2)

## 2020-09-29 LAB — TROPONIN I (HIGH SENSITIVITY)
Troponin I (High Sensitivity): 5 ng/L (ref <18)
Troponin I (High Sensitivity): 4 ng/L (ref <18)

## 2020-09-29 MED ORDER — ACETAMINOPHEN 500 MG PO TABS
1000.0000 mg | ORAL_TABLET | Freq: Once | ORAL | Status: AC
  Administered 2020-09-29: 1000 mg via ORAL
  Filled 2020-09-29: qty 2

## 2020-09-29 MED ORDER — AZITHROMYCIN 250 MG PO TABS: 250.0000 mg | ORAL_TABLET | Freq: Every day | ORAL | 0 refills | Status: DC

## 2020-09-29 NOTE — ED Notes (Addendum)
Pt reports chest tightness x 2-3 days, nausea and lightheaded this morning. Heart rate was 120 and blood pressure was high, cant remember the numbers.

## 2020-09-29 NOTE — ED Notes (Signed)
Pt reports she vomited 3 times this morning  After drinking carbonated water, states the third time she vomited and had some blood; pt can't tell color of the blood or how much.

## 2020-09-29 NOTE — ED Provider Notes (Signed)
E. Lopez    CSN: [id number] Arrival date & time: 09/29/20  1217      History   Chief Complaint Chief Complaint  Patient presents with  . Cough    HPI Caitlin Rogers is a 65 y.o. female.   Patient presents with chest tightness, shortness of breath, one episode of vomiting blood this morning.  She had a CT scan this morning that showed bronchitis with potential bilateral bronchopneumonia.  She called her PCP to discuss her symptoms and was told to come here.  Her chest tightness has been present for 2 to 3 days.  She had nausea and lightheadedness this morning; during this episode her pulse rate was 120 and her blood pressure was high.  She states she drank carbonated water this morning, then vomited 3 times; blood was present in the the third episode of emesis.  She denies fever, chills, diarrhea, or other symptoms.  Her medical history includes hypertension, hyperlipidemia, aortic atherosclerosis, GERD.  The history is provided by the patient and medical records.    Past Medical History:  Diagnosis Date  . Anxiety   . Arthritis   . Atypical chest pain 11/11/2016  . Cardiac murmur 01/19/2020  . Chest discomfort 01/19/2020  . COVID-19 06/2019  . Current moderate episode of major depressive disorder without prior episode ([place]) 07/06/2020  . Essential hypertension 11/11/2016  . GERD (gastroesophageal reflux disease) 11/11/2016  . Hyperlipemia   . Hyperlipidemia 11/11/2016  . Hypertension   . Numbness 01/02/2020  . Palpitations 11/11/2016  . Ringing in the ears    post covid symptom    Patient Active Problem List   Diagnosis Date Noted  . Aortic atherosclerosis ([place]) 09/29/2020  . Hypertension   . Ringing in the ears   . Current moderate episode of major depressive disorder without prior episode ([place]) 07/06/2020  . Anxiety   . Arthritis   . Hyperlipemia   . Cervical radiculopathy 02/13/2020  . Chest discomfort 01/19/2020  . Cardiac murmur 01/19/2020  . Other  spondylosis with myelopathy, cervical region 01/19/2020  . Numbness 01/02/2020  . COVID-19 06/2019  . Essential hypertension 11/11/2016  . Hyperlipidemia 11/11/2016  . GERD (gastroesophageal reflux disease) 11/11/2016  . Palpitations 11/11/2016  . Atypical chest pain 11/11/2016    Past Surgical History:  Procedure Laterality Date  . APPENDECTOMY    . BUNIONECTOMY     bilat  . CERVICAL SPINE SURGERY    . CESAREAN SECTION     in toe  . COLONOSCOPY    . MOUTH SURGERY  2019   implant with crown  . POLYPECTOMY    . TONSILLECTOMY    . TUBAL LIGATION    . UPPER GASTROINTESTINAL ENDOSCOPY    . WISDOM TOOTH EXTRACTION      OB History   No obstetric history on file.      Home Medications    Prior to Admission medications   Medication Sig Start Date End Date Taking? Authorizing Provider  ALPRAZolam Duanne Moron) 0.5 MG tablet Take 0.5 mg by mouth 2 (two) times daily as needed for anxiety. 09/24/19   [provider]  metoprolol succinate (TOPROL-XL) 25 MG 24 hr tablet Take 25 mg by mouth daily.  08/06/19   [provider]  Olmesartan-amLODIPine-HCTZ 40-10-12.5 MG TABS Take 0.5 tablets by mouth daily. 01/12/20   [provider]  olmesartan-hydrochlorothiazide (BENICAR HCT) 40-12.5 MG tablet Take 1 tablet by mouth daily. 09/14/20   [provider]  omeprazole (PRILOSEC) 40 MG capsule  Take 1 capsule (40 mg total) by mouth daily. 04/05/20   Ladene Artist, MD  sertraline (ZOLOFT) 100 MG tablet Take 100 mg by mouth daily.    [provider]    Family History Family History  Problem Relation Age of Onset  . Colon cancer Paternal Aunt   . Hypertension Mother   . Diabetes Mellitus II Mother   . Hypertension Father   . Stroke Father   . Heart disease Father   . Stroke Brother   . Heart disease Brother   . Heart attack Paternal Grandfather   . CAD Brother   . Colon polyps Neg Hx   . Esophageal cancer Neg Hx   . Rectal cancer Neg Hx   .  Stomach cancer Neg Hx     Social History Social History   Tobacco Use  . Smoking status: Former Research scientist (life sciences)  . Smokeless tobacco: Never Used  Vaping Use  . Vaping Use: Never used  Substance Use Topics  . Alcohol use: Yes    Comment: 1 glass of wine, and 2 oz of [place]   . Drug use: No     Allergies   Codeine   Review of Systems Review of Systems  Constitutional: Negative for chills and fever.  HENT: Negative for ear pain and sore throat.   Eyes: Negative for pain and visual disturbance.  Respiratory: Positive for shortness of breath. Negative for cough.   Cardiovascular: Positive for chest pain. Negative for palpitations.  Gastrointestinal: Positive for nausea and vomiting. Negative for abdominal pain.       Blood in emesis.  Genitourinary: Negative for dysuria and hematuria.  Musculoskeletal: Negative for arthralgias and back pain.  Skin: Negative for color change and rash.  Neurological: Positive for light-headedness. Negative for syncope, weakness and numbness.  All other systems reviewed and are negative.    Physical Exam Triage Vital Signs ED Triage Vitals  Enc Vitals Group     BP 09/29/20 1307 129/78     Pulse Rate 09/29/20 1307 98     Resp 09/29/20 1307 18     Temp 09/29/20 1307 99.4 F (37.4 C)     Temp Source 09/29/20 1307 Oral     SpO2 09/29/20 1307 98 %     Weight --      Height --      Head Circumference --      Peak Flow --      Pain Score 09/29/20 1302 0     Pain Loc --      Pain Edu? --      Excl. in [place]? --    No data found.  Updated Vital Signs BP 129/78 (BP Location: Left Arm)   Pulse 98   Temp 99.4 F (37.4 C) (Oral)   Resp 18   SpO2 98%   Visual Acuity Right Eye Distance:   Left Eye Distance:   Bilateral Distance:    Right Eye Near:   Left Eye Near:    Bilateral Near:     Physical Exam Vitals and nursing note reviewed.  Constitutional:      General: She is not in acute distress.    Appearance: She is well-developed  and well-nourished.  HENT:     Head: Normocephalic and atraumatic.     Mouth/Throat:     Mouth: Mucous membranes are moist.  Eyes:     Conjunctiva/sclera: Conjunctivae normal.  Cardiovascular:     Rate and Rhythm: Normal rate and regular rhythm.  Heart sounds: Normal heart sounds.  Pulmonary:     Effort: Pulmonary effort is normal. No respiratory distress.     Breath sounds: Normal breath sounds.  Abdominal:     General: Bowel sounds are normal.     Palpations: Abdomen is soft.     Tenderness: There is no abdominal tenderness. There is no guarding or rebound.  Musculoskeletal:        General: No edema.     Cervical back: Neck supple.  Skin:    General: Skin is warm and dry.  Neurological:     General: No focal deficit present.     Mental Status: She is alert and oriented to person, place, and time.     Gait: Gait normal.  Psychiatric:        Mood and Affect: Mood and affect and mood normal.        Behavior: Behavior normal.      UC Treatments / Results  Labs (all labs ordered are listed, but only abnormal results are displayed) Labs Reviewed - No data to display  EKG   Radiology CT CARDIAC SCORING (SELF PAY ONLY)  Result Date: 09/29/2020 EXAM: OVER-READ INTERPRETATION  CT CHEST The following report is an over-read performed by radiologist Dr. Vinnie Langton of [facility], [place] on 09/29/2020. This over-read does not include interpretation of cardiac or coronary anatomy or pathology. The coronary calcium score interpretation by the cardiologist is attached. COMPARISON:  None. FINDINGS: Aortic atherosclerosis. Diffuse bronchial wall thickening with patchy areas of mild peribronchovascular ground-glass attenuation are noted in the lungs bilaterally. Within the visualized portions of the thorax there are no suspicious appearing pulmonary nodules or masses, no pleural effusions, no pneumothorax and no lymphadenopathy. Visualized portions of the upper abdomen are  unremarkable. Moderate-sized hiatal hernia. There are no aggressive appearing lytic or blastic lesions noted in the visualized portions of the skeleton. IMPRESSION: 1. Findings in the lungs are suggestive of bronchitis, potentially with developing multilobar bilateral bronchopneumonia. 2. Aortic Atherosclerosis (ICD10-I70.0). 3. Moderate-sized hiatal hernia. Electronically Signed   By: Vinnie Langton M.D.   On: 09/29/2020 08:58    Procedures Procedures (including critical care time)  Medications Ordered in UC Medications - No data to display  Initial Impression / Assessment and Plan / UC Course  I have reviewed the triage vital signs and the nursing notes.  Pertinent labs & imaging results that were available during my care of the patient were reviewed by me and considered in my medical decision making (see chart for details).   Hematemesis, chest pain, shortness of breath, pneumonia.  EKG shows sinus rhythm, rate 94, no ST elevation, compared to previous from June 2021.  Sending patient to the ED for evaluation.  She declines EMS.  She states she feels stable to drive herself.   Final Clinical Impressions(s) / UC Diagnoses   Final diagnoses:  Pneumonia of both lungs due to infectious organism, unspecified part of lung  Hematemesis, presence of nausea not specified  Chest pain, unspecified type  Shortness of breath     Discharge Instructions     Go to the emergency department for evaluation.        ED Prescriptions    None     PDMP not reviewed this encounter.   Sharion Balloon, NP 09/29/20 1354

## 2020-09-29 NOTE — Telephone Encounter (Signed)
Agree with in person evaluation

## 2020-09-29 NOTE — Discharge Instructions (Addendum)
Go to the emergency department for evaluation.     

## 2020-09-29 NOTE — Telephone Encounter (Signed)
Pt said she had CT cardiac scoring test done today; pt got notification from card that if could not see PCP today to go to UC. Pt said for 2 days has had prod cough with? Color of phlegm, chest tightness and soreness in entire chest, slight SOB today even when sitting. H/A now pain level 7, lightheaded consistently this morning; pt felt like she might fall x 2 but pt did not fall. Pt has not taken temp but pt having chills this morning,BP 170/80 P 120, pt did vomit x 1 this morning with moderate amt of redness; pt could not describe if bright red blood or dark blood. Pt has runny nose and and head congestion. Pt said reason she went for test was cardiology wanted to ck for calcification of hearts arteries. No CP. I spoke with Dr Einar Pheasant and she could do a virtual at end of morning today but Dr Einar Pheasant agreed pt should go to [facility] for eval and testing. Pt voiced udnerstanding and pt will got to [facility] on [facility]. Sending note to Dr Einar Pheasant.

## 2020-09-29 NOTE — ED Notes (Signed)
Pt called for a room with no response x1

## 2020-09-29 NOTE — ED Triage Notes (Addendum)
Patient has a Cardiac CT for a calcium score. Received a phone call stating she may pneumonia. States have been coughing for 2-3 days especially at night. Patient called primary doctor no available appointments went to urgent care sent to ED for evaluation. Chest pain tightness 5/10 and headache 7/10 throbbing. States also had 3 episodes of emesis and 3rd episode bright red blood states history of acid reflux and hernia.

## 2020-09-29 NOTE — ED Triage Notes (Signed)
Pt reports she had a had CT cardiac scoring test done today and was told she may have pneumonia and was sent to her PCP.   Pt reports cough at evening when lay down, nasal congestion and headache  x 2-3 days; chills x 1 day.

## 2020-09-29 NOTE — Discharge Instructions (Signed)
Please return for any problem.  °

## 2020-09-29 NOTE — ED Provider Notes (Signed)
[facility] EMERGENCY DEPARTMENT Provider Note   CSN: [id number] Arrival date & time: 09/29/20  1354     History Chief Complaint  Patient presents with  . Chest Pain  . Shortness of Breath  . Headache  . Hemoptysis    Caitlin Rogers is a 65 y.o. female.  65 year old female with prior medical street as detailed below presents for evaluation.  Patient reports that she received a coronary cardiac CT this morning.  She received a call from radiology informing her that she had a possible early pneumonia on CT.  She was sent to the ED for evaluation.  Patient does report mild cough for the last several days.  She denies fever.    The history is provided by the patient and medical records.  Illness Location:  Mild cough, possible infiltrates on coronary CT Severity:  Mild Onset quality:  Gradual Timing:  Constant Progression:  Unchanged Chronicity:  New      Past Medical History:  Diagnosis Date  . Anxiety   . Arthritis   . Atypical chest pain 11/11/2016  . Cardiac murmur 01/19/2020  . Chest discomfort 01/19/2020  . COVID-19 06/2019  . Current moderate episode of major depressive disorder without prior episode ([place]) 07/06/2020  . Essential hypertension 11/11/2016  . GERD (gastroesophageal reflux disease) 11/11/2016  . Hyperlipemia   . Hyperlipidemia 11/11/2016  . Hypertension   . Numbness 01/02/2020  . Palpitations 11/11/2016  . Ringing in the ears    post covid symptom    Patient Active Problem List   Diagnosis Date Noted  . Aortic atherosclerosis ([place]) 09/29/2020  . Hypertension   . Ringing in the ears   . Current moderate episode of major depressive disorder without prior episode ([place]) 07/06/2020  . Anxiety   . Arthritis   . Hyperlipemia   . Cervical radiculopathy 02/13/2020  . Chest discomfort 01/19/2020  . Cardiac murmur 01/19/2020  . Other spondylosis with myelopathy, cervical region 01/19/2020  . Numbness 01/02/2020  . COVID-19 06/2019   . Essential hypertension 11/11/2016  . Hyperlipidemia 11/11/2016  . GERD (gastroesophageal reflux disease) 11/11/2016  . Palpitations 11/11/2016  . Atypical chest pain 11/11/2016    Past Surgical History:  Procedure Laterality Date  . APPENDECTOMY    . BUNIONECTOMY     bilat  . CERVICAL SPINE SURGERY    . CESAREAN SECTION     in toe  . COLONOSCOPY    . MOUTH SURGERY  2019   implant with crown  . POLYPECTOMY    . TONSILLECTOMY    . TUBAL LIGATION    . UPPER GASTROINTESTINAL ENDOSCOPY    . WISDOM TOOTH EXTRACTION       OB History   No obstetric history on file.     Family History  Problem Relation Age of Onset  . Colon cancer Paternal Aunt   . Hypertension Mother   . Diabetes Mellitus II Mother   . Hypertension Father   . Stroke Father   . Heart disease Father   . Stroke Brother   . Heart disease Brother   . Heart attack Paternal Grandfather   . CAD Brother   . Colon polyps Neg Hx   . Esophageal cancer Neg Hx   . Rectal cancer Neg Hx   . Stomach cancer Neg Hx     Social History   Tobacco Use  . Smoking status: Former Research scientist (life sciences)  . Smokeless tobacco: Never Used  Vaping Use  . Vaping Use: Never used  Substance Use Topics  . Alcohol use: Yes    Comment: 1 glass of wine, and 2 oz of [place]   . Drug use: No    Home Medications Prior to Admission medications   Medication Sig Start Date End Date Taking? Authorizing Provider  ALPRAZolam Duanne Moron) 0.5 MG tablet Take 0.5 mg by mouth 2 (two) times daily as needed for anxiety. 09/24/19  Yes [provider]  azithromycin (ZITHROMAX) 250 MG tablet Take 1 tablet (250 mg total) by mouth daily. Take first 2 tablets together, then 1 every day until finished. 09/29/20  Yes Valarie Merino, MD  metoprolol succinate (TOPROL-XL) 25 MG 24 hr tablet Take 25 mg by mouth daily.  08/06/19  Yes [provider]  naproxen sodium (ALEVE) 220 MG tablet Take 220 mg by mouth 2 (two) times daily as needed (for  headaches).   Yes [provider]  olmesartan-hydrochlorothiazide (BENICAR HCT) 40-12.5 MG tablet Take 0.5 tablets by mouth daily. 09/14/20  Yes [provider]  omeprazole (PRILOSEC) 40 MG capsule Take 1 capsule (40 mg total) by mouth daily. Patient taking differently: Take 40 mg by mouth daily before breakfast. 04/05/20  Yes Ladene Artist, MD  sertraline (ZOLOFT) 100 MG tablet Take 100 mg by mouth in the morning.   Yes [provider]    Allergies    Codeine and Rosuvastatin  Review of Systems   Review of Systems  All other systems reviewed and are negative.   Physical Exam Updated Vital Signs BP 93/79   Pulse 71   Temp 99.4 F (37.4 C)   Resp 17   Ht 5\' 3"  (1.6 m)   Wt 74.8 kg   SpO2 98%   BMI 29.23 kg/m   Physical Exam Vitals and nursing note reviewed.  Constitutional:      General: She is not in acute distress.    Appearance: She is well-developed and well-nourished.  HENT:     Head: Normocephalic and atraumatic.     Mouth/Throat:     Mouth: Oropharynx is clear and moist.  Eyes:     Extraocular Movements: EOM normal.     Conjunctiva/sclera: Conjunctivae normal.     Pupils: Pupils are equal, round, and reactive to light.  Cardiovascular:     Rate and Rhythm: Normal rate and regular rhythm.     Heart sounds: Normal heart sounds.  Pulmonary:     Effort: Pulmonary effort is normal. No respiratory distress.     Breath sounds: Normal breath sounds.  Abdominal:     General: There is no distension.     Palpations: Abdomen is soft.     Tenderness: There is no abdominal tenderness.  Musculoskeletal:        General: No deformity or edema. Normal range of motion.     Cervical back: Normal range of motion and neck supple.  Skin:    General: Skin is warm and dry.  Neurological:     Mental Status: She is alert and oriented to person, place, and time.  Psychiatric:        Mood and Affect: Mood and affect normal.     ED Results /  Procedures / Treatments   Labs (all labs ordered are listed, but only abnormal results are displayed) Labs Reviewed  BASIC METABOLIC PANEL - Abnormal; Notable for the following components:      Result Value   Sodium 134 (*)    Chloride 97 (*)    Glucose, Bld 121 (*)  All other components within normal limits  CBC - Abnormal; Notable for the following components:   WBC 10.7 (*)    Platelets 145 (*)    All other components within normal limits  RESP PANEL BY RT-PCR (FLU A&B, COVID) [id number]  TROPONIN I (HIGH SENSITIVITY)  TROPONIN I (HIGH SENSITIVITY)    EKG EKG Interpretation  Date/Time:  Wednesday September 29 2020 17:07:43 EST Ventricular Rate:  83 PR Interval:  162 QRS Duration: 99 QT Interval:  363 QTC Calculation: 427 R Axis:   22 Text Interpretation: Sinus rhythm Low voltage, precordial leads Confirmed by Dene Gentry [id number]) on 09/29/2020 5:14:58 PM   Radiology CT CARDIAC SCORING (SELF PAY ONLY)  Addendum Date: 09/29/2020   ADDENDUM REPORT: 09/29/2020 18:17 CLINICAL DATA:  46F with hypertension and hyperlipidemia for risk stratification EXAM: Coronary Calcium Score TECHNIQUE: The patient was scanned on a [employer] scanner. Axial non-contrast 3 mm slices were carried out through the heart. The data set was analyzed on a dedicated work station and scored using the [place]. FINDINGS: Non-cardiac: See separate report from [facility]. Ascending Aorta: Normal caliber. Calcification noted in the descending aorta. Pericardium: Normal Coronary arteries: Normal origins and anatomy. Right dominant. Calcification note din all three coronary distributions. IMPRESSION: Coronary calcium score of 225. This was 92nd percentile for age and sex matched control. Recommend aggressive risk factor modification including LDL goal <70. Skeet Latch, MD Electronically Signed   By: Skeet Latch   On: 09/29/2020 18:17   Result Date: 09/29/2020 EXAM: OVER-READ INTERPRETATION  CT  CHEST The following report is an over-read performed by radiologist Dr. Vinnie Langton of [facility], [place] on 09/29/2020. This over-read does not include interpretation of cardiac or coronary anatomy or pathology. The coronary calcium score interpretation by the cardiologist is attached. COMPARISON:  None. FINDINGS: Aortic atherosclerosis. Diffuse bronchial wall thickening with patchy areas of mild peribronchovascular ground-glass attenuation are noted in the lungs bilaterally. Within the visualized portions of the thorax there are no suspicious appearing pulmonary nodules or masses, no pleural effusions, no pneumothorax and no lymphadenopathy. Visualized portions of the upper abdomen are unremarkable. Moderate-sized hiatal hernia. There are no aggressive appearing lytic or blastic lesions noted in the visualized portions of the skeleton. IMPRESSION: 1. Findings in the lungs are suggestive of bronchitis, potentially with developing multilobar bilateral bronchopneumonia. 2. Aortic Atherosclerosis (ICD10-I70.0). 3. Moderate-sized hiatal hernia. Electronically Signed: By: Vinnie Langton M.D. On: 09/29/2020 08:58   DG Chest Portable 1 View  Result Date: 09/29/2020 CLINICAL DATA:  Chest pain short of breath hemoptysis EXAM: PORTABLE CHEST 1 VIEW COMPARISON:  Cardiac CT 09/29/2020 FINDINGS: Heart size within normal limits. Pulmonary vascularity normal. Patchy infiltrate in the lung bases best seen on CT however could represent pneumonia. No pleural effusion. Negative for heart failure. ACDF cervical spine. IMPRESSION: Mild bibasilar infiltrates most likely pneumonia. This is best seen on cardiac CT earlier today. Electronically Signed   By: Franchot Gallo M.D.   On: 09/29/2020 17:33    Procedures Procedures   Medications Ordered in ED Medications  acetaminophen (TYLENOL) tablet 1,000 mg (1,000 mg Oral Given 09/29/20 1727)    ED Course  I have reviewed the triage vital signs and the nursing  notes.  Pertinent labs & imaging results that were available during my care of the patient were reviewed by me and considered in my medical decision making (see chart for details).    MDM Rules/Calculators/A&P  MDM  Screen complete  Amiayah Giebel was evaluated in Emergency Department on 09/29/2020 for the symptoms described in the history of present illness. She was evaluated in the context of the global COVID-19 pandemic, which necessitated consideration that the patient might be at risk for infection with the SARS-CoV-2 virus that causes COVID-19. Institutional protocols and algorithms that pertain to the evaluation of patients at risk for COVID-19 are in a state of rapid change based on information released by regulatory bodies including the CDC and federal and state organizations. These policies and algorithms were followed during the patient's care in the ED.   Patient is presenting for evaluation of reported possible pneumonia found on cardiac CT.  Patient is minimally symptomatic.  She does endorse mild cough.  Work-up today is demonstrative of possible early pneumonia.  Patient is not hypoxic.  She has normal symptoms otherwise.  Will cover with course of azithromycin.  Patient understands need for close follow-up.  Strict return precautions given and understood.   Final Clinical Impression(s) / ED Diagnoses Final diagnoses:  Cough    Rx / DC Orders ED Discharge Orders         Ordered    azithromycin (ZITHROMAX) 250 MG tablet  Daily        09/29/20 1856           Valarie Merino, MD 09/29/20 1902

## 2020-10-01 MED ORDER — ROSUVASTATIN CALCIUM 20 MG PO TABS: 20.0000 mg | ORAL_TABLET | Freq: Every day | ORAL | 3 refills | Status: DC

## 2020-10-01 NOTE — Addendum Note (Signed)
Addended by: Truddie Hidden on: 10/01/2020 09:37 AM   Modules accepted: Orders

## 2020-10-05 ENCOUNTER — Telehealth: Payer: Self-pay | Admitting: *Deleted

## 2020-10-05 ENCOUNTER — Other Ambulatory Visit: Payer: Self-pay

## 2020-10-05 NOTE — Telephone Encounter (Signed)
Patient called stating that she wanted to let Dr. Einar Pheasant know that she had to go to the ER and was diagnosed with pneumonia. See ED notes. Patient stated that she has finished the antibiotic given and is doing better. Patient stated that she still has a productive cough/green but no fever. Patient stated that she needs to know when she should follow-up with Dr. Einar Pheasant. Patient wants to know how soon she needs to have a follow-up chest x-ray. Patient stated that she also wanted to let Dr. Einar Pheasant know that her cardiologist has increased her Rosuvastatin to 20 mg. Patient stated that she has an appointment scheduled with her cardiologist Thursday 10/07/20 to go over some test that were done.

## 2020-10-05 NOTE — Telephone Encounter (Signed)
Appreciate the update.   If feeling better, no need for follow-up in office or imaging.   Cough can linger for 6-8 week so would recommend follow-up if cough continuing beyond that time frame.

## 2020-10-07 ENCOUNTER — Ambulatory Visit: Payer: BC Managed Care – PPO | Admitting: Cardiology

## 2020-10-07 ENCOUNTER — Encounter: Payer: Self-pay | Admitting: Cardiology

## 2020-10-07 ENCOUNTER — Other Ambulatory Visit: Payer: Self-pay

## 2020-10-07 VITALS — BP 164/90 | HR 84 | Ht 63.0 in | Wt 164.0 lb

## 2020-10-07 DIAGNOSIS — I10 Essential (primary) hypertension: Secondary | ICD-10-CM

## 2020-10-07 DIAGNOSIS — I2510 Atherosclerotic heart disease of native coronary artery without angina pectoris: Secondary | ICD-10-CM

## 2020-10-07 DIAGNOSIS — E782 Mixed hyperlipidemia: Secondary | ICD-10-CM | POA: Diagnosis not present

## 2020-10-07 DIAGNOSIS — R002 Palpitations: Secondary | ICD-10-CM | POA: Diagnosis not present

## 2020-10-07 DIAGNOSIS — I2584 Coronary atherosclerosis due to calcified coronary lesion: Secondary | ICD-10-CM

## 2020-10-07 DIAGNOSIS — I700 Atherosclerosis of aorta: Secondary | ICD-10-CM | POA: Diagnosis not present

## 2020-10-07 HISTORY — DX: Atherosclerotic heart disease of native coronary artery without angina pectoris: I25.10

## 2020-10-07 HISTORY — DX: Coronary atherosclerosis due to calcified coronary lesion: I25.84

## 2020-10-07 IMAGING — CT CT CERVICAL SPINE W/O CM
4 of 5 series · 14 of 33 positions shown, 16 images · non-contrast
Comparison: Cervical spine MRI 01/02/2020

CLINICAL DATA: Cervical spondylosis with myelopathy. Neck pain.
Left hand/finger numbness. Prior cervical fusion.

EXAM:
CT CERVICAL SPINE WITHOUT CONTRAST
TECHNIQUE: Multidetector CT imaging of the cervical spine was performed without
intravenous contrast. Multiplanar CT image reconstructions were also
generated.

[Series 4: c-spine 2.00 br40 s3 axial (person_name) · axial · 0.31mm/px · z∈[-736,-644]mm · 3 of 93 slices shown, 4 images]
[im 24/93  soft-tissue]
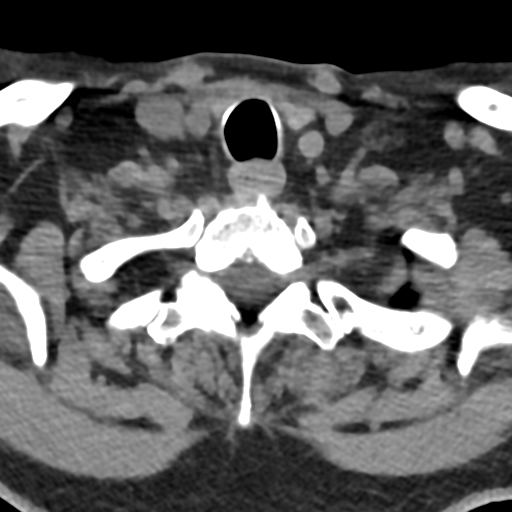
[im 24/93  bone]
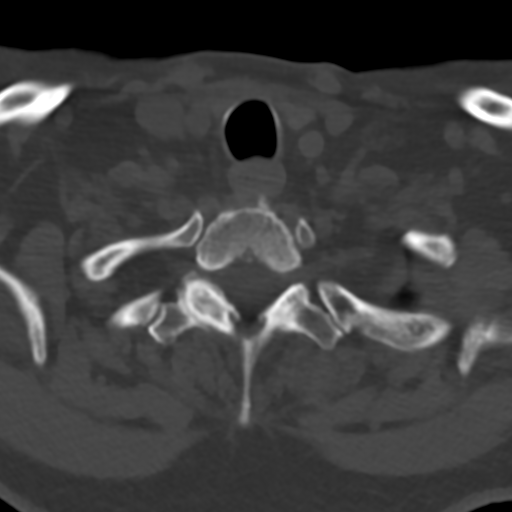
[im 47/93  bone]
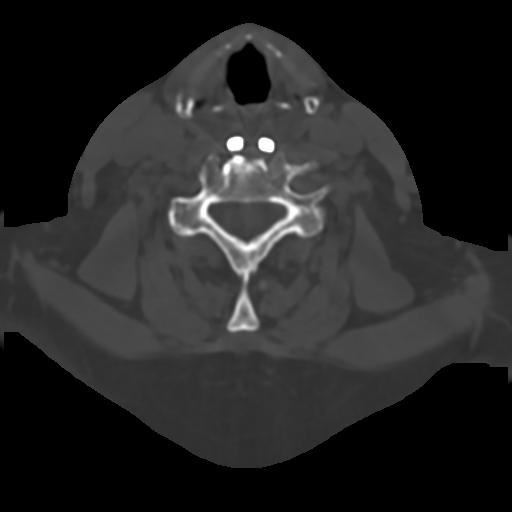
[im 70/93  bone]
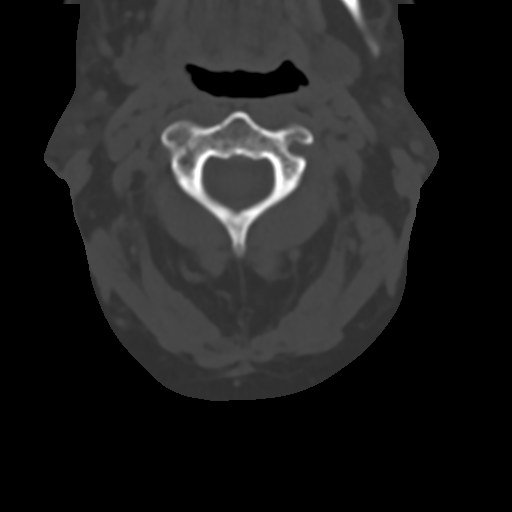

[Series 5: c-spine 2.00 br60 s3 sag bone · sagittal · 0.31mm/px · 5 of 68 slices shown, 6 images]
[im 23/68  bone]
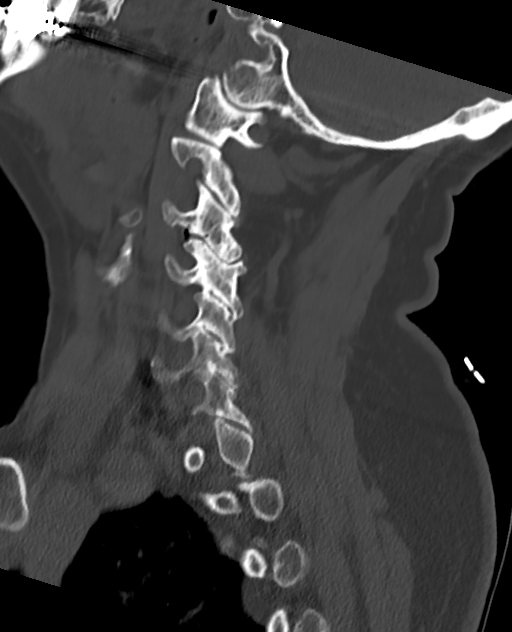
[im 28/68  bone]
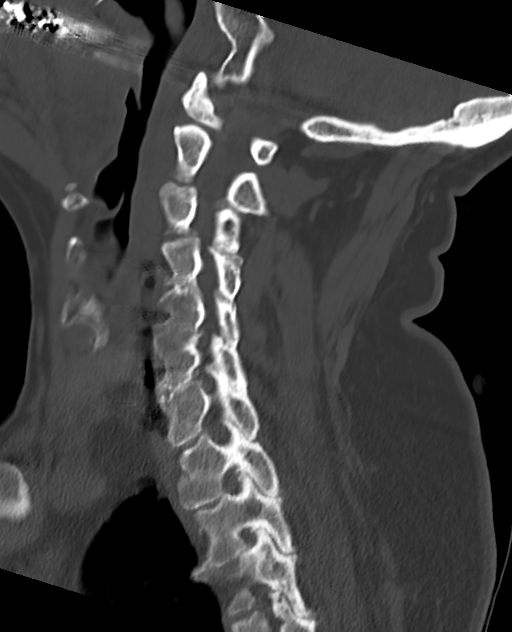
[im 34/68  soft-tissue]
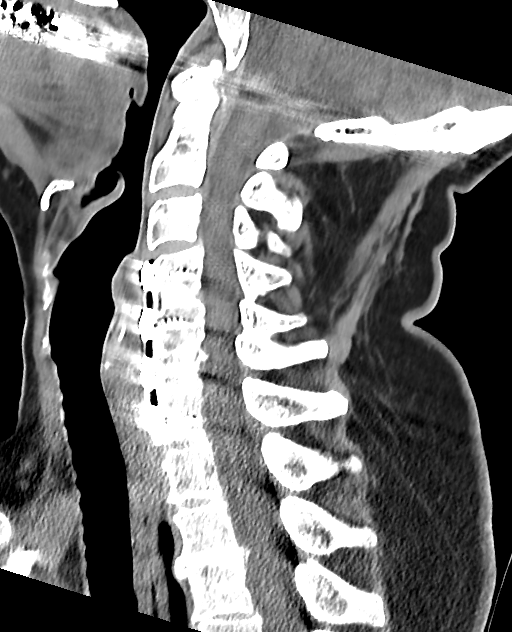
[im 34/68  bone]
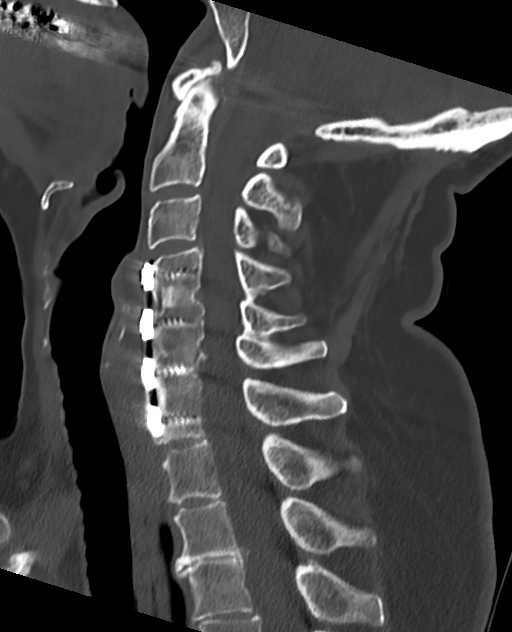
[im 40/68  bone]
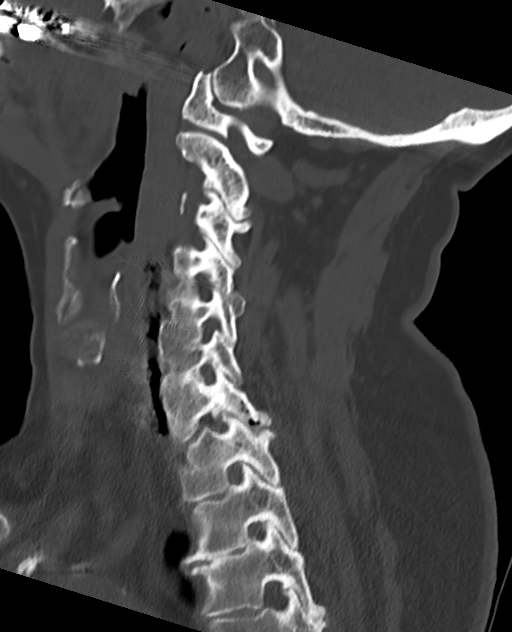
[im 45/68  bone]
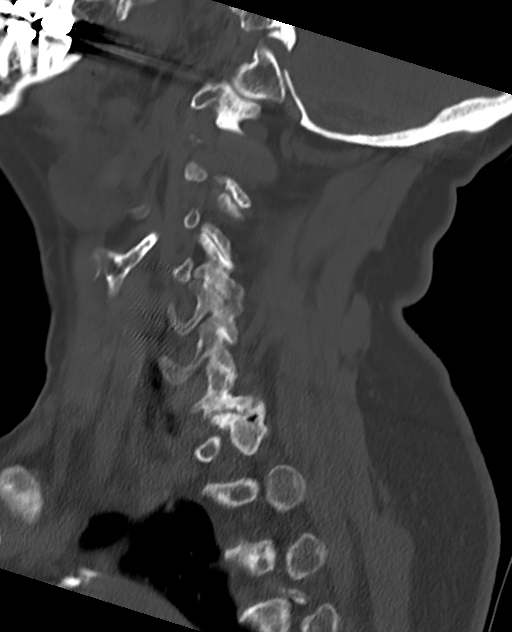

[Series 7: c-spine 2.00 hr60 s3 cor bone · coronal · 0.27mm/px · 3 of 78 slices shown]
[im 16/78  bone]
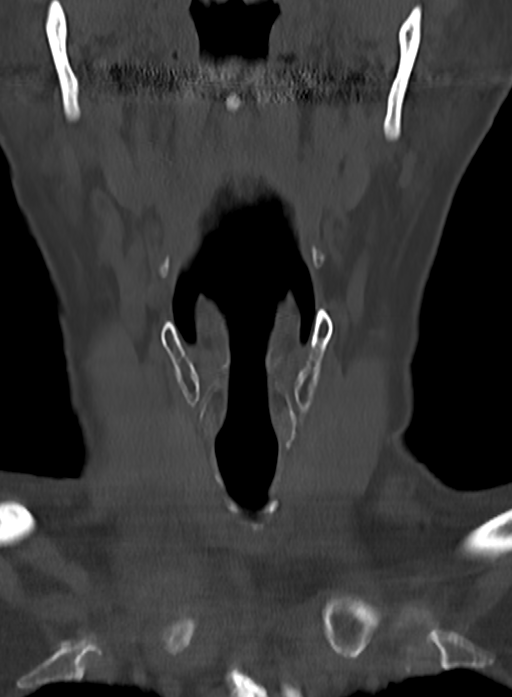
[im 31/78  bone]
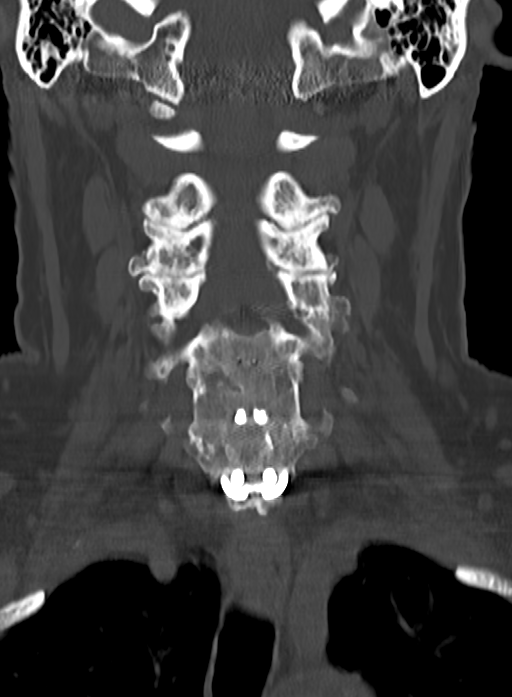
[im 47/78  bone]
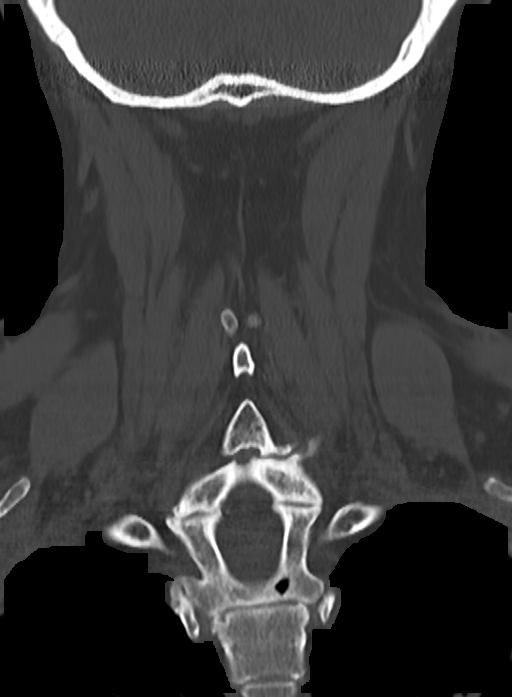

[Series 10: c-spine 2.00 hr60 s3 orthogonal axial · axial · 0.31mm/px · z∈[-770,-669]mm · 3 of 95 slices shown]
[im 24/95  bone]
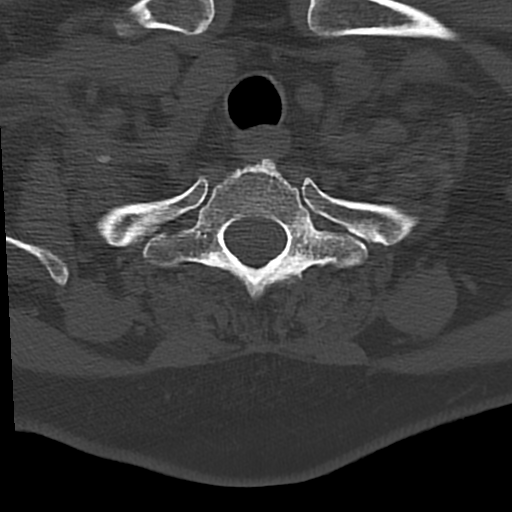
[im 48/95  bone]
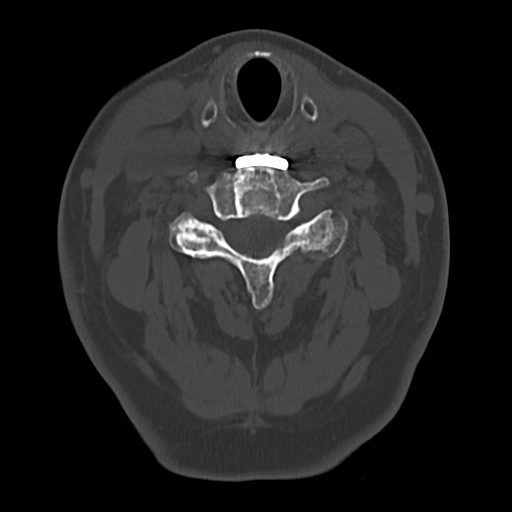
[im 71/95  bone]
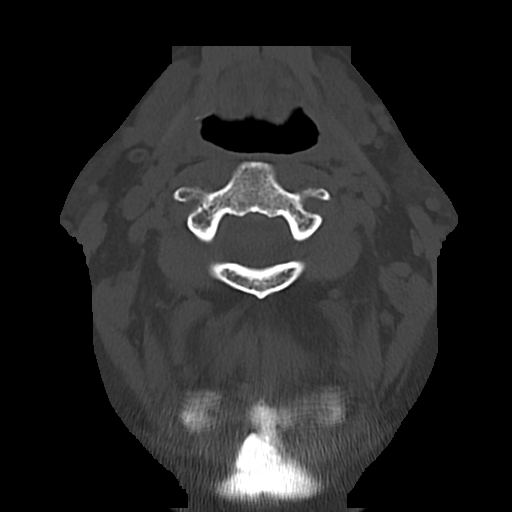

[14 of 33 positions shown; findings below may reference images not displayed]

FINDINGS: Alignment: Trace anterolisthesis of C7 on T1.

Skull base and vertebrae: No fracture or suspicious osseous lesion.
Solid C4-C7 ACDF. Mild median C1-2 arthropathy.

Soft tissues and spinal canal: No prevertebral swelling or fluid.

Disc levels:

C2-3: Moderate left greater than right facet arthrosis without
evidence of significant stenosis.

C3-4: Mild disc bulging, mild posterior annular or PLL
calcification, asymmetric right uncovertebral spurring, and severe
right and mild-to-moderate left facet arthrosis result in mild
spinal stenosis and severe right and mild left neural foraminal
stenosis.

C4-5: ACDF. Patent spinal canal and right neural foramen. Mild left
osseous neural foraminal narrowing.

C5-6: ACDF. Focal posterior spurring or PLL calcification without
significant spinal stenosis. Mild to moderate osseous neural
foraminal narrowing bilaterally.

C6-7: ACDF. Patent spinal canal and left neural foramen. Mild right
osseous neural foraminal narrowing.

C7-T1: Asymmetric severe left facet arthrosis result in moderate
left neural foraminal stenosis with potential left C8 nerve root
impingement. Patent spinal canal and right neural foramen.

Upper chest: Biapical pleuroparenchymal scarring.

Other: Mild calcific atherosclerosis at the carotid bifurcations.
IMPRESSION: 1. Solid C4-C7 ACDF.
2. Moderate left neural foraminal stenosis at C7-T1.
3. Mild spinal stenosis and severe right neural foraminal stenosis
at C3-4.

## 2020-10-07 MED ORDER — ASPIRIN EC 81 MG PO TBEC: 81.0000 mg | DELAYED_RELEASE_TABLET | Freq: Every day | ORAL | 3 refills | Status: DC

## 2020-10-07 NOTE — Progress Notes (Signed)
Cardiology Office Note:    Date:  10/07/2020   ID:  Caitlin Rogers, DOB 06-14-1956, MRN [id number]  PCP:  Lesleigh Noe, MD  Cardiologist:  Jenean Lindau, MD   Referring MD: Lesleigh Noe, MD    ASSESSMENT:    1. Essential hypertension   2. Mixed hyperlipidemia   3. Aortic atherosclerosis (HCC)   4. Palpitations   5. Coronary artery calcification    PLAN:    In order of problems listed above:  1. Coronary artery disease: Secondary prevention stressed with the patient.  Importance of compliance with diet medication stressed and she vocalized understanding.  She was advised to walk at least half an hour a day on a regular basis and she promises to do so. 2. Essential hypertension: Blood pressure stable and diet was emphasized.  She has an element of whitecoat hypertension.  I discussed the reports of blood pressure checks she brought from home and they are fine.  Salt intake issues were discussed. 3. Mixed dyslipidemia: Markedly elevated LDL and patient now on statin therapy.  We gave her a sheet for low-cholesterol diet and she agrees to do better. 4. Patient will be seen in follow-up appointment in 3 months or earlier if the patient has any concerns.  She will be back in 1 month for fasting liver lipid check and Chem-7.  She was advised to take a coated baby aspirin on a daily basis.   Medication Adjustments/Labs and Tests Ordered: Current medicines are reviewed at length with the patient today.  Concerns regarding medicines are outlined above.  No orders of the defined types were placed in this encounter.  No orders of the defined types were placed in this encounter.    No chief complaint on file.    History of Present Illness:    Caitlin Rogers is a 65 y.o. female.  Patient has history of coronary artery disease based on extensive calcification of the coronary arteries found on CT scanning.  She had abnormal findings in the lungs suggestive of pneumonitis and has  undergone therapy with antibiotics.  She has history of essential hypertension and dyslipidemia.  She denies any problems at this time and takes care of activities of daily living.  No chest pain orthopnea or PND.  At the time of my evaluation, the patient is alert awake oriented and in no distress.  She is now walking some on a regular basis.  Past Medical History:  Diagnosis Date  . Anxiety   . Aortic atherosclerosis ([place]) 09/29/2020   On coronary CT   . Arthritis   . Atypical chest pain 11/11/2016  . Cardiac murmur 01/19/2020  . Cervical radiculopathy 02/13/2020  . Chest discomfort 01/19/2020  . COVID-19 06/2019  . Current moderate episode of major depressive disorder without prior episode ([place]) 07/06/2020  . Essential hypertension 11/11/2016  . GERD (gastroesophageal reflux disease) 11/11/2016  . Hyperlipemia   . Hyperlipidemia 11/11/2016  . Hypertension   . Numbness 01/02/2020  . Other spondylosis with myelopathy, cervical region 01/19/2020  . Palpitations 11/11/2016  . Ringing in the ears    post covid symptom    Past Surgical History:  Procedure Laterality Date  . APPENDECTOMY    . BUNIONECTOMY     bilat  . CERVICAL SPINE SURGERY    . CESAREAN SECTION     in toe  . COLONOSCOPY    . MOUTH SURGERY  2019   implant with crown  . POLYPECTOMY    . TONSILLECTOMY    .  TUBAL LIGATION    . UPPER GASTROINTESTINAL ENDOSCOPY    . WISDOM TOOTH EXTRACTION      Current Medications: Current Meds  Medication Sig  . ALPRAZolam (XANAX) 0.5 MG tablet Take 0.5 mg by mouth 2 (two) times daily as needed for anxiety.  . metoprolol succinate (TOPROL-XL) 25 MG 24 hr tablet Take 25 mg by mouth daily.   . naproxen sodium (ALEVE) 220 MG tablet Take 220 mg by mouth 2 (two) times daily as needed (for headaches).  . olmesartan-hydrochlorothiazide (BENICAR HCT) 40-12.5 MG tablet Take 0.5 tablets by mouth daily.  Marland Kitchen omeprazole (PRILOSEC) 40 MG capsule Take 1 capsule (40 mg total) by mouth daily.  .  rosuvastatin (CRESTOR) 20 MG tablet Take 1 tablet (20 mg total) by mouth daily.  . sertraline (ZOLOFT) 100 MG tablet Take 100 mg by mouth in the morning.     Allergies:   Codeine   Social History   Socioeconomic History  . Marital status: Married    Spouse name: Louis  . Number of children: 2  . Years of education: Some college  . Highest education level: Not on file  Occupational History  . Not on file  Tobacco Use  . Smoking status: Former Research scientist (life sciences)  . Smokeless tobacco: Never Used  Vaping Use  . Vaping Use: Never used  Substance and Sexual Activity  . Alcohol use: Yes    Comment: 1 glass of wine, and 2 oz of [place]   . Drug use: No  . Sexual activity: Yes    Birth control/protection: Post-menopausal  Other Topics Concern  . 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      Family: 2 children - Scientist, research (physical sciences) ([place]) and Judson Roch ([place])       Enjoys: nothing currently      Exercise: not currently   Diet: pretty good, limits sweets, tries to eat healthy food, leftovers for lunch      Safety   Seat belts: Yes    Guns: Yes  and secure   Safe in relationships: Yes    Social Determinants of Radio broadcast assistant Strain: Not on file  Food Insecurity: Not on file  Transportation Needs: Not on file  Physical Activity: Not on file  Stress: Not on file  Social Connections: Not on file     Family History: The patient's family history includes CAD in her brother; Colon cancer in her paternal aunt; Diabetes Mellitus II in her mother; Heart attack in her paternal grandfather; Heart disease in her brother and father; Hypertension in her father and mother; Stroke in her brother and father. There is no history of Colon polyps, Esophageal cancer, Rectal cancer, or Stomach cancer.  ROS:   Please see the history of present illness.    All other systems reviewed  and are negative.  EKGs/Labs/Other Studies Reviewed:    The following studies were reviewed today: IMPRESSION: Coronary calcium score of 225. This was 92nd percentile for age and sex matched control.  Recommend aggressive risk factor modification including LDL goal <70.  Skeet Latch, MD   Electronically Signed   By: Skeet Latch   On: 09/29/2020 18:17   Recent Labs: 09/03/2020: ALT 22; TSH 1.260 09/29/2020: BUN 15; Creatinine, Ser 0.85; Hemoglobin 12.9; Platelets 145; Potassium 4.0; Sodium 134  Recent Lipid Panel  Component Value Date/Time   CHOL 338 (H) 09/03/2020 0955   TRIG 80 09/03/2020 0955   HDL 133 09/03/2020 0955   CHOLHDL 2.5 09/03/2020 0955   LDLCALC 194 (H) 09/03/2020 0955    Physical Exam:    VS:  BP (!) 164/90   Pulse 84   Ht 5\' 3"  (1.6 m)   Wt 164 lb (74.4 kg)   SpO2 99%   BMI 29.05 kg/m     Wt Readings from Last 3 Encounters:  10/07/20 164 lb (74.4 kg)  09/29/20 165 lb (74.8 kg)  09/07/20 164 lb 1.9 oz (74.4 kg)     GEN: Patient is in no acute distress HEENT: Normal NECK: No JVD; No carotid bruits LYMPHATICS: No lymphadenopathy CARDIAC: Hear sounds regular, 2/6 systolic murmur at the apex. RESPIRATORY:  Clear to auscultation without rales, wheezing or rhonchi  ABDOMEN: Soft, non-tender, non-distended MUSCULOSKELETAL:  No edema; No deformity  SKIN: Warm and dry NEUROLOGIC:  Alert and oriented x 3 PSYCHIATRIC:  Normal affect   Signed, Jenean Lindau, MD  10/07/2020 1:11 PM    [facility]

## 2020-10-07 NOTE — Patient Instructions (Addendum)
Medication Instructions:  Start taking aspirin 81 mg, one tablet by mouth daily *If you need a refill on your cardiac medications before your next appointment, please call your pharmacy*   Lab Work: BMET, LFT's and Lipids in one month If you have labs (blood work) drawn today and your tests are completely normal, you will receive your results only by: Marland Kitchen MyChart Message (if you have MyChart) OR . A paper copy in the mail If you have any lab test that is abnormal or we need to change your treatment, we will call you to review the results.   Testing/Procedures: None   Follow-Up: At [facility], you and your health needs are our priority.  As part of our continuing mission to provide you with exceptional heart care, we have created designated Provider Care Teams.  These Care Teams include your primary Cardiologist (physician) and Advanced Practice Providers (APPs -  Physician Assistants and Nurse Practitioners) who all work together to provide you with the care you need, when you need it.    Your next appointment:   3 month(s)  The format for your next appointment:   In Person  Provider:   Jyl Heinz, MD   Other Instructions   Preventing High Cholesterol Cholesterol is a white, waxy substance similar to fat that the human body needs to help build cells. The liver makes all the cholesterol that a person's body needs. Having high cholesterol (hypercholesterolemia) increases your risk for heart disease and stroke. Extra or excess cholesterol comes from the food that you eat. High cholesterol can often be prevented with diet and lifestyle changes. If you already have high cholesterol, you can control it with diet, lifestyle changes, and medicines. How can high cholesterol affect me? If you have high cholesterol, fatty deposits (plaques) may build up on the walls of your blood vessels. The blood vessels that carry blood away from your heart are called arteries. Plaques make the  arteries narrower and stiffer. This in turn can:  Restrict or block blood flow and cause blood clots to form.  Increase your risk for heart attack and stroke. What can increase my risk for high cholesterol? This condition is more likely to develop in people who:  Eat foods that are high in saturated fat or cholesterol. Saturated fat is mostly found in foods that come from animal sources.  Are overweight.  Are not getting enough exercise.  Have a family history of high cholesterol (familial hypercholesterolemia). What actions can I take to prevent this? Nutrition  Eat less saturated fat.  Avoid trans fats (partially hydrogenated oils). These are often found in margarine and in some baked goods, fried foods, and snacks bought in packages.  Avoid precooked or cured meat, such as bacon, sausages, or meat loaves.  Avoid foods and drinks that have added sugars.  Eat more fruits, vegetables, and whole grains.  Choose healthy sources of protein, such as fish, poultry, lean cuts of red meat, beans, peas, lentils, and nuts.  Choose healthy sources of fat, such as: ? Nuts. ? Vegetable oils, especially olive oil. ? Fish that have healthy fats, such as omega-3 fatty acids. These fish include mackerel or salmon.   Lifestyle  Lose weight if you are overweight. Maintaining a healthy body mass index (BMI) can help prevent or control high cholesterol. It can also lower your risk for diabetes and high blood pressure. Ask your health care provider to help you with a diet and exercise plan to lose weight safely.  Do not use any products that contain nicotine or tobacco, such as cigarettes, e-cigarettes, and chewing tobacco. If you need help quitting, ask your health care provider. Alcohol use  Do not drink alcohol if: ? Your health care provider tells you not to drink. ? You are pregnant, may be pregnant, or are planning to become pregnant.  If you drink alcohol: ? Limit how much you use  to:  0-1 drink a day for women.  0-2 drinks a day for men. ? Be aware of how much alcohol is in your drink. In the [place] one drink equals one 12 oz bottle of beer (355 mL), one 5 oz glass of wine (148 mL), or one 1 oz glass of hard liquor (44 mL). Activity  Get enough exercise. Do exercises as told by your health care provider.  Each week, do at least 150 minutes of exercise that takes a medium level of effort (moderate-intensity exercise). This kind of exercise: ? Makes your heart beat faster while allowing you to still be able to talk. ? Can be done in short sessions several times a day or longer sessions a few times a week. For example, on 5 days each week, you could walk fast or ride your bike 3 times a day for 10 minutes each time.   Medicines  Your health care provider may recommend medicines to help lower cholesterol. This may be a medicine to lower the amount of cholesterol that your liver makes. You may need medicine if: ? Diet and lifestyle changes have not lowered your cholesterol enough. ? You have high cholesterol and other risk factors for heart disease or stroke.  Take over-the-counter and prescription medicines only as told by your health care provider. General information  Manage your risk factors for high cholesterol. Talk with your health care provider about all your risk factors and how to lower your risk.  Manage other conditions that you have, such as diabetes or high blood pressure (hypertension).  Have blood tests to check your cholesterol levels at regular points in time as told by your health care provider.  Keep all follow-up visits as told by your health care provider. This is important. Where to find more information  American Heart Association: www.heart.org  National Heart, Lung, and Blood Institute: https://wilson-eaton.com/ Summary  High cholesterol increases your risk for heart disease and stroke. By keeping your cholesterol level low, you can reduce  your risk for these conditions.  High cholesterol can often be prevented with diet and lifestyle changes.  Work with your health care provider to manage your risk factors, and have your blood tested regularly. This information is not intended to replace advice given to you by your health care provider. Make sure you discuss any questions you have with your health care provider. Document Revised: 04/22/2019 Document Reviewed: 04/22/2019 Elsevier Patient Education  [place].

## 2020-10-07 NOTE — Telephone Encounter (Signed)
Spoke with patient and updated her of Dr. Verda Cumins recommendations. Patient verbalized understanding.

## 2020-10-18 ENCOUNTER — Other Ambulatory Visit: Payer: Self-pay

## 2020-10-18 MED ORDER — ALPRAZOLAM 0.5 MG PO TABS: 0.5000 mg | ORAL_TABLET | Freq: Two times a day (BID) | ORAL | 0 refills | Status: DC | PRN

## 2020-10-18 NOTE — Telephone Encounter (Signed)
Pt will need f/u visit prior to additional refills.

## 2020-10-18 NOTE — Telephone Encounter (Signed)
Last OV: 07/06/20 Last refill: By historical provider No future appts scheduled.

## 2020-10-19 NOTE — Telephone Encounter (Signed)
Called patient and scheduled follow up 4/11.

## 2020-11-01 ENCOUNTER — Ambulatory Visit: Payer: BC Managed Care – PPO | Admitting: Family Medicine

## 2020-11-01 ENCOUNTER — Encounter: Payer: Self-pay | Admitting: Family Medicine

## 2020-11-01 ENCOUNTER — Other Ambulatory Visit: Payer: Self-pay

## 2020-11-01 VITALS — BP 100/60 | HR 69 | Temp 97.1°F | Ht 63.0 in | Wt 162.8 lb

## 2020-11-01 DIAGNOSIS — F321 Major depressive disorder, single episode, moderate: Secondary | ICD-10-CM | POA: Diagnosis not present

## 2020-11-01 DIAGNOSIS — I10 Essential (primary) hypertension: Secondary | ICD-10-CM

## 2020-11-01 DIAGNOSIS — L309 Dermatitis, unspecified: Secondary | ICD-10-CM

## 2020-11-01 HISTORY — DX: Dermatitis, unspecified: L30.9

## 2020-11-01 MED ORDER — TRIAMCINOLONE ACETONIDE 0.1 % EX CREA: 1.0000 "application " | TOPICAL_CREAM | Freq: Two times a day (BID) | CUTANEOUS | 0 refills | Status: DC

## 2020-11-01 NOTE — Progress Notes (Signed)
Subjective:     Caitlin Rogers is a 65 y.o. female presenting for Follow-up (HT) and medication discussion (Pt would like to decrease sertraline)     HPI   #HTN - this has improved - felt this was high before due to anxiety  #Depression/anxiety - was on zoloft 200 mg  - did not start therapy - has been talking to an old friend - feels physically she is not doing well  - is using less xanax - down to every few weeks  - will occasionally take in the evenings -   #Rash - has a rash - using cortisone cream - itchy - present for 1 month, off and on  - scratching at night -   Review of Systems  07/06/2020: Clinic - HTN - metoprolol and combo pill. Anxiety - xanax 3-4 times per week. Depression - therapy 10/07/2020: Cards - started statin. CAD - statin and walking  Social History   Tobacco Use  Smoking Status Former Smoker  Smokeless Tobacco Never Used        Objective:    BP Readings from Last 3 Encounters:  11/01/20 100/60  10/07/20 (!) 164/90  09/29/20 93/79   Wt Readings from Last 3 Encounters:  11/01/20 162 lb 12 oz (73.8 kg)  10/07/20 164 lb (74.4 kg)  09/29/20 165 lb (74.8 kg)    BP 100/60   Pulse 69   Temp (!) 97.1 F (36.2 C) (Temporal)   Ht 5\' 3"  (1.6 m)   Wt 162 lb 12 oz (73.8 kg)   SpO2 96%   BMI 28.83 kg/m    Physical Exam Constitutional:      General: She is not in acute distress.    Appearance: She is well-developed. She is not diaphoretic.  HENT:     Right Ear: External ear normal.     Left Ear: External ear normal.     Nose: Nose normal.  Eyes:     Conjunctiva/sclera: Conjunctivae normal.  Cardiovascular:     Rate and Rhythm: Normal rate and regular rhythm.     Heart sounds: No murmur heard.   Pulmonary:     Effort: Pulmonary effort is normal. No respiratory distress.     Breath sounds: Normal breath sounds. No wheezing.  Musculoskeletal:     Cervical back: Neck supple.  Skin:    General: Skin is warm and dry.      Capillary Refill: Capillary refill takes less than 2 seconds.     Comments: Erythematous rash along the pants line and the bra line with some areas of patchy scale.   Neurological:     Mental Status: She is alert. Mental status is at baseline.  Psychiatric:        Mood and Affect: Mood normal.        Behavior: Behavior normal.    Depression screen [facility] 2/9 11/01/2020 07/06/2020  Decreased Interest 1 1  Down, Depressed, Hopeless 0 1  PHQ - 2 Score 1 2  Altered sleeping 1 1  Tired, decreased energy 1 1  Change in appetite 0 1  Feeling bad or failure about yourself  0 0  Trouble concentrating 0 1  Moving slowly or fidgety/restless 0 1  Suicidal thoughts 0 0  PHQ-9 Score 3 7  Difficult doing work/chores Somewhat difficult Somewhat difficult           Assessment & Plan:   Problem List Items Addressed This Visit      Cardiovascular and Mediastinum  Essential hypertension    BP improved. Cont olmesartan-hctz 40-12.5 and metoprolol 25 mg.         Musculoskeletal and Integument   Dermatitis    Trial of triamcinolone twice daily. Discussed considering loser fitting garments or changing detergent if persisting.       Relevant Medications   triamcinolone cream (KENALOG) 0.1 %     Other   Current moderate episode of major depressive disorder without prior episode ([place]) - Primary    Doing well on zoloft 100 mg. Cont. Cont prn xanax.           Return in about 1 year (around 11/01/2021).  Lesleigh Noe, MD  This visit occurred during the SARS-CoV-2 public health emergency.  Safety protocols were in place, including screening questions prior to the visit, additional usage of staff PPE, and extensive cleaning of exam room while observing appropriate contact time as indicated for disinfecting solutions.

## 2020-11-01 NOTE — Assessment & Plan Note (Signed)
>>  ASSESSMENT AND PLAN FOR DEPRESSION, MAJOR, SINGLE EPISODE, COMPLETE REMISSION (HCC) WRITTEN ON 11/01/2020  9:48 AM BY CODY, JESSICA R, MD  Doing well on zoloft 100 mg. Cont. Cont prn xanax.

## 2020-11-01 NOTE — Assessment & Plan Note (Signed)
BP improved. Cont olmesartan-hctz 40-12.5 and metoprolol 25 mg.

## 2020-11-01 NOTE — Patient Instructions (Signed)
#  Rash - use triamcinolone cream twice daily for up to 3 weeks - or use 1-2 days after the rash resolves - if the rash returns consider different detergent or looser clothes  Continue zoloft 100 mg

## 2020-11-01 NOTE — Assessment & Plan Note (Signed)
Trial of triamcinolone twice daily. Discussed considering loser fitting garments or changing detergent if persisting.

## 2020-11-01 NOTE — Assessment & Plan Note (Signed)
Doing well on zoloft 100 mg. Cont. Cont prn xanax.

## 2020-12-03 DIAGNOSIS — E782 Mixed hyperlipidemia: Secondary | ICD-10-CM | POA: Diagnosis not present

## 2020-12-04 LAB — HEPATIC FUNCTION PANEL
ALT: 36 IU/L — ABNORMAL HIGH (ref 0–32)
AST: 44 IU/L — ABNORMAL HIGH (ref 0–40)
Albumin: 4.9 g/dL — ABNORMAL HIGH (ref 3.8–4.8)
Alkaline Phosphatase: 78 IU/L (ref 44–121)
Bilirubin Total: <0.2 mg/dL (ref 0.0–1.2)
Bilirubin, Direct: <0.1 mg/dL (ref 0.00–0.40)
Total Protein: 7.5 g/dL (ref 6.0–8.5)

## 2020-12-04 LAB — LIPID PANEL
Chol/HDL Ratio: 2.2 ratio (ref 0.0–4.4)
Cholesterol, Total: 227 mg/dL — ABNORMAL HIGH (ref 100–199)
HDL: 103 mg/dL (ref >39)
LDL Chol Calc (NIH): 103 mg/dL — ABNORMAL HIGH (ref 0–99)
Triglycerides: 128 mg/dL (ref 0–149)
VLDL Cholesterol Cal: 21 mg/dL (ref 5–40)

## 2020-12-06 NOTE — Addendum Note (Signed)
Addended by: Truddie Hidden on: 12/06/2020 01:35 PM   Modules accepted: Orders

## 2021-01-16 ENCOUNTER — Other Ambulatory Visit: Payer: Self-pay | Admitting: Family Medicine

## 2021-01-17 DIAGNOSIS — R7989 Other specified abnormal findings of blood chemistry: Secondary | ICD-10-CM | POA: Diagnosis not present

## 2021-01-17 DIAGNOSIS — E782 Mixed hyperlipidemia: Secondary | ICD-10-CM | POA: Diagnosis not present

## 2021-01-18 LAB — HEPATIC FUNCTION PANEL
ALT: 41 IU/L — ABNORMAL HIGH (ref 0–32)
AST: 62 IU/L — ABNORMAL HIGH (ref 0–40)
Albumin: 4.7 g/dL (ref 3.8–4.8)
Alkaline Phosphatase: 83 IU/L (ref 44–121)
Bilirubin Total: <0.2 mg/dL (ref 0.0–1.2)
Bilirubin, Direct: 0.1 mg/dL (ref 0.00–0.40)
Total Protein: 7.1 g/dL (ref 6.0–8.5)

## 2021-01-19 NOTE — Addendum Note (Signed)
Addended by: Truddie Hidden on: 01/19/2021 08:50 AM   Modules accepted: Orders

## 2021-01-20 ENCOUNTER — Other Ambulatory Visit: Payer: Self-pay

## 2021-01-21 ENCOUNTER — Ambulatory Visit (INDEPENDENT_AMBULATORY_CARE_PROVIDER_SITE_OTHER): Payer: BC Managed Care – PPO | Admitting: Cardiology

## 2021-01-21 ENCOUNTER — Other Ambulatory Visit: Payer: Self-pay

## 2021-01-21 ENCOUNTER — Other Ambulatory Visit: Payer: Self-pay | Admitting: Family Medicine

## 2021-01-21 ENCOUNTER — Encounter: Payer: Self-pay | Admitting: Cardiology

## 2021-01-21 VITALS — BP 136/70 | HR 102 | Ht 63.0 in | Wt 164.1 lb

## 2021-01-21 DIAGNOSIS — I700 Atherosclerosis of aorta: Secondary | ICD-10-CM

## 2021-01-21 DIAGNOSIS — E782 Mixed hyperlipidemia: Secondary | ICD-10-CM | POA: Diagnosis not present

## 2021-01-21 DIAGNOSIS — I10 Essential (primary) hypertension: Secondary | ICD-10-CM

## 2021-01-21 DIAGNOSIS — I2510 Atherosclerotic heart disease of native coronary artery without angina pectoris: Secondary | ICD-10-CM

## 2021-01-21 DIAGNOSIS — I2584 Coronary atherosclerosis due to calcified coronary lesion: Secondary | ICD-10-CM

## 2021-01-21 NOTE — Patient Instructions (Signed)

## 2021-01-21 NOTE — Progress Notes (Signed)
Cardiology Office Note:    Date:  01/21/2021   ID:  Caitlin Rogers, DOB 1955/09/14, MRN [id number]  PCP:  Lesleigh Noe, MD  Cardiologist:  Jenean Lindau, MD   Referring MD: Lesleigh Noe, MD    ASSESSMENT:    1. Aortic atherosclerosis ([place])   2. Coronary artery calcification   3. Essential hypertension   4. Mixed hyperlipidemia    PLAN:    In order of problems listed above:  Atherosclerotic vascular disease and coronary artery calcification: Secondary prevention stressed with patient.  Importance of compliance with diet medication stressed and she vocalized understanding.  She was advised to walk at least half an hour a day 5 days a week and she promises to do so. Essential hypertension: Blood pressure stable and diet was emphasized. Mixed dyslipidemia: Lipids were reviewed.  LFTs are elevated mildly.  They are stable.  I told her to wean and cut off her alcohol.  She is going to discuss this with her primary care provider.  Risks of statin therapy and LFT issues were discussed again she is going to run this by her primary care provider.  We will send lab work to primary care. Patient will be seen in follow-up appointment in 6 months or earlier if the patient has any concerns    Medication Adjustments/Labs and Tests Ordered: Current medicines are reviewed at length with the patient today.  Concerns regarding medicines are outlined above.  No orders of the defined types were placed in this encounter.  No orders of the defined types were placed in this encounter.    No chief complaint on file.    History of Present Illness:    Caitlin Rogers is a 65 y.o. female.  Patient has past medical history of aortic atherosclerosis, coronary artery calcification, essential hypertension and dyslipidemia.  She denies any problems at this time and takes care of activities of daily living.  No chest pain orthopnea or PND.  She tells me that she takes alcohol on a regular basis.  At the  time of my evaluation, the patient is alert awake oriented and in no distress.  Past Medical History:  Diagnosis Date   Anxiety    Aortic atherosclerosis ([place]) 09/29/2020   On coronary CT    Arthritis    Atypical chest pain 11/11/2016   Cardiac murmur 01/19/2020   Cervical radiculopathy 02/13/2020   Chest discomfort 01/19/2020   Coronary artery calcification 10/07/2020   COVID-19 06/2019   Current moderate episode of major depressive disorder without prior episode ([place]) 07/06/2020   Dermatitis 11/01/2020   Essential hypertension 11/11/2016   GERD (gastroesophageal reflux disease) 11/11/2016   Hyperlipemia    Hyperlipidemia 11/11/2016   Hypertension    Numbness 01/02/2020   Other spondylosis with myelopathy, cervical region 01/19/2020   Palpitations 11/11/2016   Ringing in the ears    post covid symptom    Past Surgical History:  Procedure Laterality Date   APPENDECTOMY     BUNIONECTOMY     bilat   CERVICAL SPINE SURGERY     CESAREAN SECTION     in toe   COLONOSCOPY     MOUTH SURGERY  2019   implant with crown   POLYPECTOMY     TONSILLECTOMY     TUBAL LIGATION     UPPER GASTROINTESTINAL ENDOSCOPY     WISDOM TOOTH EXTRACTION      Current Medications: Current Meds  Medication Sig   ALPRAZolam (XANAX) 0.5 MG tablet Take  1 tablet (0.5 mg total) by mouth 2 (two) times daily as needed for anxiety.   aspirin EC 81 MG tablet Take 1 tablet (81 mg total) by mouth daily. Swallow whole.   diclofenac Sodium (VOLTAREN) 1 % GEL Apply 1 application topically daily at 12 noon.   metoprolol succinate (TOPROL-XL) 25 MG 24 hr tablet Take 25 mg by mouth daily.    naproxen sodium (ALEVE) 220 MG tablet Take 220 mg by mouth 2 (two) times daily as needed for headache (for headaches).   olmesartan-hydrochlorothiazide (BENICAR HCT) 40-12.5 MG tablet Take 0.5 tablets by mouth daily.   omeprazole (PRILOSEC) 40 MG capsule Take 1 capsule (40 mg total) by mouth daily.   rosuvastatin (CRESTOR) 20 MG tablet  Take 20 mg by mouth daily.   sertraline (ZOLOFT) 100 MG tablet Take 1 tablet (100 mg total) by mouth every morning.   triamcinolone cream (KENALOG) 0.1 % Apply 1 application topically 2 (two) times daily.     Allergies:   Codeine   Social History   Socioeconomic History   Marital status: Married    Spouse name: Louis   Number of children: 2   Years of education: Some college   Highest education level: Not on file  Occupational History   Not on file  Tobacco Use   Smoking status: Former    Pack years: 0.00   Smokeless tobacco: Never  Vaping Use   Vaping Use: Never used  Substance and Sexual Activity   Alcohol use: Yes    Comment: 1 glass of wine, and 2 oz of [place]    Drug use: No   Sexual activity: Yes    Birth control/protection: Post-menopausal  Other Topics Concern   Not on file  Social History Narrative   07/06/20   From: moved around a kid, moved to [facility]   Living: with Louis, husband (2005)   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      Family: 2 children - Scientist, research (physical sciences) ([place]) and Journalist, newspaper ([place])       Enjoys: nothing currently      Exercise: not currently   Diet: pretty good, limits sweets, tries to eat healthy food, leftovers for lunch      Safety   Seat belts: Yes    Guns: Yes  and secure   Safe in relationships: Yes    Social Determinants of Radio broadcast assistant Strain: Not on file  Food Insecurity: Not on file  Transportation Needs: Not on file  Physical Activity: Not on file  Stress: Not on file  Social Connections: Not on file     Family History: The patient's family history includes CAD in her brother; Colon cancer in her paternal aunt; Diabetes Mellitus II in her mother; Heart attack in her paternal grandfather; Heart disease in her brother and father; Hypertension in her father and mother; Stroke in her brother and father. There is no history of Colon polyps, Esophageal cancer, Rectal cancer, or Stomach cancer.  ROS:   Please see the history  of present illness.    All other systems reviewed and are negative.  EKGs/Labs/Other Studies Reviewed:    The following studies were reviewed today: I discussed my findings with the patient in extensive length.   Recent Labs: 09/03/2020: TSH 1.260 09/29/2020: BUN 15; Creatinine, Ser 0.85; Hemoglobin 12.9; Platelets 145; Potassium 4.0; Sodium 134 01/17/2021: ALT 41  Recent Lipid Panel    Component Value Date/Time   CHOL 227 (H) 12/03/2020 1103   TRIG  128 12/03/2020 1103   HDL 103 12/03/2020 1103   CHOLHDL 2.2 12/03/2020 1103   LDLCALC 103 (H) 12/03/2020 1103    Physical Exam:    VS:  BP 136/70   Pulse (!) 102   Ht 5\' 3"  (1.6 m)   Wt 164 lb 1.3 oz (74.4 kg)   SpO2 95%   BMI 29.07 kg/m     Wt Readings from Last 3 Encounters:  01/21/21 164 lb 1.3 oz (74.4 kg)  11/01/20 162 lb 12 oz (73.8 kg)  10/07/20 164 lb (74.4 kg)     GEN: Patient is in no acute distress HEENT: Normal NECK: No JVD; No carotid bruits LYMPHATICS: No lymphadenopathy CARDIAC: Hear sounds regular, 2/6 systolic murmur at the apex. RESPIRATORY:  Clear to auscultation without rales, wheezing or rhonchi  ABDOMEN: Soft, non-tender, non-distended MUSCULOSKELETAL:  No edema; No deformity  SKIN: Warm and dry NEUROLOGIC:  Alert and oriented x 3 PSYCHIATRIC:  Normal affect   Signed, Jenean Lindau, MD  01/21/2021 2:24 PM    [facility]

## 2021-01-25 NOTE — Telephone Encounter (Signed)
Last Fill or Written Date and Quantity: 10/18/20 #30 w/ 0 Last Office Visit and Type: 11/01/20 Next Office Visit and Type: none

## 2021-02-21 ENCOUNTER — Other Ambulatory Visit: Payer: Self-pay

## 2021-02-21 DIAGNOSIS — R7989 Other specified abnormal findings of blood chemistry: Secondary | ICD-10-CM

## 2021-02-21 NOTE — Progress Notes (Signed)
L 

## 2021-03-04 ENCOUNTER — Telehealth: Payer: BC Managed Care – PPO | Admitting: Nurse Practitioner

## 2021-03-04 ENCOUNTER — Encounter: Payer: Self-pay | Admitting: Nurse Practitioner

## 2021-03-04 ENCOUNTER — Encounter: Payer: Self-pay | Admitting: Gastroenterology

## 2021-03-04 ENCOUNTER — Telehealth: Payer: Self-pay

## 2021-03-04 DIAGNOSIS — U071 COVID-19: Secondary | ICD-10-CM | POA: Diagnosis not present

## 2021-03-04 MED ORDER — MOLNUPIRAVIR EUA 200MG CAPSULE: 4.0000 | ORAL_CAPSULE | Freq: Two times a day (BID) | ORAL | 0 refills | Status: AC

## 2021-03-04 NOTE — Telephone Encounter (Signed)
Pt tested positive for Covid today. Was asking if Dr Einar Pheasant would call in an antiviral for her. I advised she would need to be seen at an UC or do a VV to obtain a rx for that. We are full today so I gave her info on [email or web address] to do a virtual. She was going to get on there as soon as we hung up.

## 2021-03-04 NOTE — Progress Notes (Signed)
Virtual Visit Consent   Caitlin Rogers, you are scheduled for a virtual visit with a [facility] provider today.     Just as with appointments in the office, your consent must be obtained to participate.  Your consent will be active for this visit and any virtual visit you may have with one of our providers in the next 365 days.     If you have a MyChart account, a copy of this consent can be sent to you electronically.  All virtual visits are billed to your insurance company just like a traditional visit in the office.    As this is a virtual visit, video technology does not allow for your provider to perform a traditional examination.  This may limit your provider's ability to fully assess your condition.  If your provider identifies any concerns that need to be evaluated in person or the need to arrange testing (such as labs, EKG, etc.), we will make arrangements to do so.     Although advances in technology are sophisticated, we cannot ensure that it will always work on either your end or our end.  If the connection with a video visit is poor, the visit may have to be switched to a telephone visit.  With either a video or telephone visit, we are not always able to ensure that we have a secure connection.     I need to obtain your verbal consent now.   Are you willing to proceed with your visit today?    Caitlin Rogers has provided verbal consent on 03/04/2021 for a virtual visit (video or telephone).   [name], FNP   Date: 03/04/2021 1:49 PM   Virtual Visit via Video Note   I, [name], connected with  Caitlin Rogers  ([id number], [birth year]) on 03/04/21 at  2:00 PM EDT by a video-enabled telemedicine application and verified that I am speaking with the correct person using two identifiers.  Location: Patient: Virtual Visit Location Patient: Home Provider: Virtual Visit Location Provider: Office/Clinic   I discussed the limitations of evaluation and management by telemedicine  and the availability of in person appointments. The patient expressed understanding and agreed to proceed.    History of Present Illness: Caitlin Rogers is a 65 y.o. who identifies as a female who was assigned female at birth, and is being seen today after testing positive for COVID today.  She had a close contact at work.   She started to have symptoms yesterday.   Symptoms so far include tightness/congestion in her chest, feels as though she has a fever but has not checked, fatigued  She denies a history of asthma She has a history of tobacco use prior not current   She has had COVID in the past [id number] without complications  No hospitalizations   She has been vaccinated for COVID x3 so far with Pfizer vaccine    Problems:  Patient Active Problem List   Diagnosis Date Noted   Dermatitis 11/01/2020   Coronary artery calcification 10/07/2020   Aortic atherosclerosis ([place]) 09/29/2020   Hypertension    Ringing in the ears    Current moderate episode of major depressive disorder without prior episode ([place]) 07/06/2020   Anxiety    Arthritis    Hyperlipemia    Cervical radiculopathy 02/13/2020   Chest discomfort 01/19/2020   Cardiac murmur 01/19/2020   Other spondylosis with myelopathy, cervical region 01/19/2020   Numbness 01/02/2020   COVID-19 06/2019   Essential hypertension 11/11/2016  Hyperlipidemia 11/11/2016   GERD (gastroesophageal reflux disease) 11/11/2016   Palpitations 11/11/2016   Atypical chest pain 11/11/2016    Allergies:  Allergies  Allergen Reactions   Codeine Itching   Medications:  Current Outpatient Medications:    ALPRAZolam (XANAX) 0.5 MG tablet, TAKE ONE TABLET BY MOUTH TWO TIMES DAILY AS NEEDED FOR ANXIETY., Disp: 30 tablet, Rfl: 0   aspirin EC 81 MG tablet, Take 1 tablet (81 mg total) by mouth daily. Swallow whole., Disp: 90 tablet, Rfl: 3   diclofenac Sodium (VOLTAREN) 1 % GEL, Apply 1 application topically daily at 12 noon., Disp: , Rfl:     metoprolol succinate (TOPROL-XL) 25 MG 24 hr tablet, Take 25 mg by mouth daily. , Disp: , Rfl:    naproxen sodium (ALEVE) 220 MG tablet, Take 220 mg by mouth 2 (two) times daily as needed for headache (for headaches)., Disp: , Rfl:    olmesartan-hydrochlorothiazide (BENICAR HCT) 40-12.5 MG tablet, Take 0.5 tablets by mouth daily., Disp: , Rfl:    omeprazole (PRILOSEC) 40 MG capsule, Take 1 capsule (40 mg total) by mouth daily., Disp: 90 capsule, Rfl: 3   rosuvastatin (CRESTOR) 20 MG tablet, Take 20 mg by mouth daily., Disp: , Rfl:    sertraline (ZOLOFT) 100 MG tablet, Take 1 tablet (100 mg total) by mouth every morning., Disp: 90 tablet, Rfl: 3   triamcinolone cream (KENALOG) 0.1 %, Apply 1 application topically 2 (two) times daily., Disp: 30 g, Rfl: 0  Observations/Objective: Patient is well-developed, well-nourished in no acute distress.  Resting comfortably at home.  Head is normocephalic, atraumatic.  No labored breathing.  Speech is clear and coherent with logical content.  Patient is alert and oriented at baseline.   Assessment and Plan: 1. COVID-19  - molnupiravir EUA 200 mg CAPS; Take 4 capsules (800 mg total) by mouth 2 (two) times daily for 5 days.  Dispense: 40 capsule; Refill: 0    Continue over the counter management of symptoms as discussed  Follow Up Instructions: I discussed the assessment and treatment plan with the patient. The patient was provided an opportunity to ask questions and all were answered. The patient agreed with the plan and demonstrated an understanding of the instructions.  A copy of instructions were sent to the patient via MyChart.  The patient was advised to call back or seek an in-person evaluation if the symptoms worsen or if the condition fails to improve as anticipated.  Time:  I spent 15 minutes with the patient via telehealth technology discussing the above problems/concerns.    [name], FNP

## 2021-03-04 NOTE — Patient Instructions (Signed)
You are being prescribed MOLNUPIRAVIR for COVID-19 infection.  ° ° °Please call the pharmacy or go through the drive through vs going inside if you are picking up the mediation yourself to prevent further spread. If prescribed to a  affiliated pharmacy, a pharmacist will bring the medication out to your car. ° ° °ADMINISTRATION INSTRUCTIONS: °Take with or without food. Swallow the tablets whole. Don't chew, crush, or break the medications because it might not work as well ° °For each dose of the medication, you should be taking FOUR tablets at one time, TWICE a day  ° °Finish your full five-day course of Molnupiravir even if you feel better before you're done. Stopping this medication too early can make it less effective to prevent severe illness related to COVID19.   ° °Molnupiravir is prescribed for YOU ONLY. Don't share it with others, even if they have similar symptoms as you. This medication might not be right for everyone.  ° °Make sure to take steps to protect yourself and others while you're taking this medication in order to get well soon and to prevent others from getting sick with COVID-19. ° ° °**If you are of childbearing potential (any gender) - it is advised to not get pregnant while taking this medication and recommended that condoms are used for female partners the next 3 months after taking the medication out of extreme caution  ° ° °COMMON SIDE EFFECTS: °Diarrhea °Nausea  °Dizziness ° ° ° °If your COVID-19 symptoms get worse, get medical help right away. Call 911 if you experience symptoms such as worsening cough, trouble breathing, chest pain that doesn't go away, confusion, a hard time staying awake, and pale or blue-colored skin. °This medication won't prevent all COVID-19 cases from getting worse.  ° ° °

## 2021-03-18 ENCOUNTER — Telehealth: Payer: Self-pay | Admitting: Gastroenterology

## 2021-03-18 NOTE — Telephone Encounter (Signed)
Inbound call from patient requesting to have labs drawn closer to home.  Please advise.

## 2021-03-18 NOTE — Telephone Encounter (Signed)
Line busy

## 2021-03-21 ENCOUNTER — Telehealth: Payer: Self-pay | Admitting: Cardiology

## 2021-03-21 NOTE — Telephone Encounter (Signed)
Patient is calling to follow up on previous call

## 2021-03-21 NOTE — Telephone Encounter (Signed)
New Message:      Patient would like to switch from Dr Geraldo Pitter service to Dr Oval Linsey service please. Is this alright with both of you?

## 2021-03-22 NOTE — Telephone Encounter (Signed)
Patient will come for the blood work here at her convenience.  She did not want to have me mail her an order for [employer]

## 2021-04-11 ENCOUNTER — Other Ambulatory Visit: Payer: Self-pay | Admitting: Family Medicine

## 2021-04-11 ENCOUNTER — Other Ambulatory Visit: Payer: Self-pay | Admitting: Gastroenterology

## 2021-04-13 NOTE — Telephone Encounter (Signed)
Last Fill or Written Date and Quantity: 01/25/21 #30 w/ 0 Last Office Visit and Type: 11/01/20 Next Office Visit and Type: none

## 2021-04-18 ENCOUNTER — Other Ambulatory Visit: Payer: Self-pay | Admitting: Family Medicine

## 2021-04-20 ENCOUNTER — Other Ambulatory Visit: Payer: Self-pay

## 2021-04-20 ENCOUNTER — Emergency Department: Payer: BC Managed Care – PPO

## 2021-04-20 ENCOUNTER — Emergency Department
Admission: EM | Admit: 2021-04-20 | Discharge: 2021-04-20 | Disposition: A | Discharge status: Home / Self Care | Payer: BC Managed Care – PPO | Attending: Emergency Medicine | Admitting: Emergency Medicine

## 2021-04-20 ENCOUNTER — Other Ambulatory Visit: Payer: Self-pay | Admitting: Family Medicine

## 2021-04-20 DIAGNOSIS — Z7982 Long term (current) use of aspirin: Secondary | ICD-10-CM | POA: Diagnosis not present

## 2021-04-20 DIAGNOSIS — S43015A Anterior dislocation of left humerus, initial encounter: Secondary | ICD-10-CM

## 2021-04-20 DIAGNOSIS — Z79899 Other long term (current) drug therapy: Secondary | ICD-10-CM | POA: Diagnosis not present

## 2021-04-20 DIAGNOSIS — W010XXA Fall on same level from slipping, tripping and stumbling without subsequent striking against object, initial encounter: Secondary | ICD-10-CM | POA: Insufficient documentation

## 2021-04-20 DIAGNOSIS — Z8616 Personal history of COVID-19: Secondary | ICD-10-CM | POA: Insufficient documentation

## 2021-04-20 DIAGNOSIS — Y92000 Kitchen of unspecified non-institutional (private) residence as  the place of occurrence of the external cause: Secondary | ICD-10-CM | POA: Insufficient documentation

## 2021-04-20 DIAGNOSIS — R0902 Hypoxemia: Secondary | ICD-10-CM | POA: Diagnosis not present

## 2021-04-20 DIAGNOSIS — Z87891 Personal history of nicotine dependence: Secondary | ICD-10-CM | POA: Insufficient documentation

## 2021-04-20 DIAGNOSIS — S43015D Anterior dislocation of left humerus, subsequent encounter: Secondary | ICD-10-CM | POA: Diagnosis not present

## 2021-04-20 DIAGNOSIS — I10 Essential (primary) hypertension: Secondary | ICD-10-CM | POA: Insufficient documentation

## 2021-04-20 DIAGNOSIS — M25512 Pain in left shoulder: Secondary | ICD-10-CM | POA: Insufficient documentation

## 2021-04-20 DIAGNOSIS — S4992XA Unspecified injury of left shoulder and upper arm, initial encounter: Secondary | ICD-10-CM | POA: Diagnosis not present

## 2021-04-20 DIAGNOSIS — M25519 Pain in unspecified shoulder: Secondary | ICD-10-CM | POA: Diagnosis not present

## 2021-04-20 DIAGNOSIS — R0689 Other abnormalities of breathing: Secondary | ICD-10-CM | POA: Diagnosis not present

## 2021-04-20 MED ORDER — FENTANYL CITRATE (PF) 250 MCG/5ML IJ SOLN
200.0000 ug | Freq: Once | INTRAMUSCULAR | Status: AC
Start: 2021-04-20 — End: 2021-04-20
  Administered 2021-04-20: 100 ug via INTRAVENOUS
  Filled 2021-04-20: qty 5

## 2021-04-20 MED ORDER — HYDROMORPHONE HCL 1 MG/ML IJ SOLN
0.5000 mg | Freq: Once | INTRAMUSCULAR | Status: AC
  Administered 2021-04-20: 0.5 mg via INTRAVENOUS
  Filled 2021-04-20: qty 1

## 2021-04-20 MED ORDER — MIDAZOLAM HCL 2 MG/2ML IJ SOLN
4.0000 mg | Freq: Once | INTRAMUSCULAR | Status: AC
  Administered 2021-04-20: 2 mg via INTRAVENOUS
  Filled 2021-04-20: qty 4

## 2021-04-20 MED ORDER — ONDANSETRON HCL 4 MG/2ML IJ SOLN
4.0000 mg | Freq: Once | INTRAMUSCULAR | Status: AC
  Administered 2021-04-20: 4 mg via INTRAVENOUS
  Filled 2021-04-20: qty 2

## 2021-04-20 MED ORDER — OXYCODONE-ACETAMINOPHEN 5-325 MG PO TABS: 1.0000 | ORAL_TABLET | ORAL | 0 refills | Status: DC | PRN

## 2021-04-20 MED ORDER — HYDROMORPHONE HCL 1 MG/ML IJ SOLN
1.0000 mg | Freq: Once | INTRAMUSCULAR | Status: AC
  Administered 2021-04-20: 1 mg via INTRAVENOUS
  Filled 2021-04-20: qty 1

## 2021-04-20 MED ORDER — MIDAZOLAM HCL 2 MG/2ML IJ SOLN
INTRAMUSCULAR | Status: AC | PRN
  Administered 2021-04-20: 2 mg via INTRAVENOUS

## 2021-04-20 NOTE — ED Provider Notes (Signed)
[facility] Emergency Department Provider Note ____________________________________________   Event Date/Time   First MD Initiated Contact with Patient 04/20/21 1119     (approximate)  I have reviewed the triage vital signs and the nursing notes.   HISTORY  Chief Complaint Shoulder Injury    HPI Caitlin Rogers is a 65 y.o. female with PMH as noted below who presents with left shoulder pain after a fall last night.  The patient states that she was stepping up some steps from her garage into her kitchen when she lost her footing and fell.  She denies hitting her head and has no other injuries.  She denies numbness or weakness.  She was seen at [facility] and sent to the ED due to a dislocation.  Past Medical History:  Diagnosis Date   Anxiety    Aortic atherosclerosis ([place]) 09/29/2020   On coronary CT    Arthritis    Atypical chest pain 11/11/2016   Cardiac murmur 01/19/2020   Cervical radiculopathy 02/13/2020   Chest discomfort 01/19/2020   Coronary artery calcification 10/07/2020   COVID-19 06/2019   Current moderate episode of major depressive disorder without prior episode ([place]) 07/06/2020   Dermatitis 11/01/2020   Essential hypertension 11/11/2016   GERD (gastroesophageal reflux disease) 11/11/2016   Hyperlipemia    Hyperlipidemia 11/11/2016   Hypertension    Numbness 01/02/2020   Other spondylosis with myelopathy, cervical region 01/19/2020   Palpitations 11/11/2016   Ringing in the ears    post covid symptom    Patient Active Problem List   Diagnosis Date Noted   Dermatitis 11/01/2020   Coronary artery calcification 10/07/2020   Aortic atherosclerosis ([place]) 09/29/2020   Hypertension    Ringing in the ears    Current moderate episode of major depressive disorder without prior episode ([place]) 07/06/2020   Anxiety    Arthritis    Hyperlipemia    Cervical radiculopathy 02/13/2020   Chest discomfort 01/19/2020   Cardiac murmur 01/19/2020   Other  spondylosis with myelopathy, cervical region 01/19/2020   Numbness 01/02/2020   COVID-19 06/2019   Essential hypertension 11/11/2016   Hyperlipidemia 11/11/2016   GERD (gastroesophageal reflux disease) 11/11/2016   Palpitations 11/11/2016   Atypical chest pain 11/11/2016    Past Surgical History:  Procedure Laterality Date   APPENDECTOMY     BUNIONECTOMY     bilat   CERVICAL SPINE SURGERY     CESAREAN SECTION     in toe   COLONOSCOPY     MOUTH SURGERY  2019   implant with crown   POLYPECTOMY     TONSILLECTOMY     TUBAL LIGATION     UPPER GASTROINTESTINAL ENDOSCOPY     WISDOM TOOTH EXTRACTION      Prior to Admission medications   Medication Sig Start Date End Date Taking? Authorizing Provider  ALPRAZolam Duanne Moron) 0.5 MG tablet TAKE ONE TABLET BY MOUTH TWICE A DAY AS NEEDED FOR ANXIETY 04/13/21   Lesleigh Noe, MD  aspirin EC 81 MG tablet Take 1 tablet (81 mg total) by mouth daily. Swallow whole. 10/07/20   Revankar, Reita Cliche, MD  diclofenac Sodium (VOLTAREN) 1 % GEL Apply 1 application topically daily at 12 noon.    [provider]  metoprolol succinate (TOPROL-XL) 25 MG 24 hr tablet Take 25 mg by mouth daily.  08/06/19   [provider]  naproxen sodium (ALEVE) 220 MG tablet Take 220 mg by mouth 2 (two) times daily as needed for  headache (for headaches).    [provider]  olmesartan-hydrochlorothiazide (BENICAR HCT) 40-12.5 MG tablet Take 0.5 tablets by mouth daily. 09/14/20   [provider]  omeprazole (PRILOSEC) 40 MG capsule TAKE ONE CAPSULE BY MOUTH ONE TIME DAILY 04/11/21   Ladene Artist, MD  rosuvastatin (CRESTOR) 20 MG tablet Take 20 mg by mouth daily.    [provider]  sertraline (ZOLOFT) 100 MG tablet Take 1 tablet (100 mg total) by mouth every morning. 01/18/21   Lesleigh Noe, MD  triamcinolone cream (KENALOG) 0.1 % Apply 1 application topically 2 (two) times daily. 11/01/20   Lesleigh Noe, MD     Allergies Codeine  Family History  Problem Relation Age of Onset   Colon cancer Paternal Aunt    Hypertension Mother    Diabetes Mellitus II Mother    Hypertension Father    Stroke Father    Heart disease Father    Stroke Brother    Heart disease Brother    Heart attack Paternal Grandfather    CAD Brother    Colon polyps Neg Hx    Esophageal cancer Neg Hx    Rectal cancer Neg Hx    Stomach cancer Neg Hx     Social History Social History   Tobacco Use   Smoking status: Former   Smokeless tobacco: Never  Scientific laboratory technician Use: Never used  Substance Use Topics   Alcohol use: Yes    Comment: 1 glass of wine, and 2 oz of [place]    Drug use: No    Review of Systems  Constitutional: No fever/chills Eyes: No visual changes. ENT: No sore throat. Cardiovascular: Denies chest pain. Respiratory: Denies shortness of breath. Gastrointestinal: No vomiting or diarrhea.  Genitourinary: Negative for dysuria.  Musculoskeletal: Negative for back pain.  Positive for left shoulder pain. Skin: Negative for rash. Neurological: Negative for headaches, focal weakness or numbness.   ____________________________________________   PHYSICAL EXAM:  VITAL SIGNS: ED Triage Vitals  Enc Vitals Group     BP 04/20/21 1118 (!) 160/82     Pulse Rate 04/20/21 1118 82     Resp 04/20/21 1118 16     Temp 04/20/21 1118 98.1 F (36.7 C)     Temp Source 04/20/21 1118 Oral     SpO2 04/20/21 1118 100 %     Weight 04/20/21 1119 170 lb (77.1 kg)     Height 04/20/21 1119 5\' 3"  (1.6 m)     Head Circumference --      Peak Flow --      Pain Score 04/20/21 1119 10     Pain Loc --      Pain Edu? --      Excl. in [place]? --     Constitutional: Alert and oriented. Well appearing and in no acute distress. Eyes: Conjunctivae are normal.  Head: Atraumatic. Nose: No congestion/rhinnorhea. Mouth/Throat: Mucous membranes are moist.   Neck: Normal range of motion.  Cardiovascular: Normal  rate, regular rhythm. Good peripheral circulation. Respiratory: Normal respiratory effort.  No retractions. Gastrointestinal: No distention.  Musculoskeletal: No lower extremity edema.  Extremities warm and well perfused.  Left shoulder deformity consistent with anterior dislocation.  2+ radial pulse. Neurologic:  Normal speech and language.  Motor and sensory intact in median, radial, and ulnar distributions of the left arm. Skin:  Skin is warm and dry. No rash noted. Psychiatric: Mood and affect are normal. Speech and behavior are normal.  ____________________________________________  LABS (all labs ordered are listed, but only abnormal results are displayed)  Labs Reviewed - No data to display ____________________________________________  EKG   ____________________________________________  RADIOLOGY  XR L shoulder interpreted by me shows anterior shoulder dislocation XR L shoulder postreduction interpreted by me shows successful reduction  ____________________________________________   PROCEDURES  Procedure(s) performed: Yes  .Ortho Injury Treatment  Date/Time: 04/20/2021 3:20 PM Performed by: Arta Silence, MD Authorized by: Arta Silence, MD   Consent:    Consent obtained:  Written   Consent given by:  Patient   Risks discussed:  Fracture, nerve damage, restricted joint movement, vascular damage, irreducible dislocation and recurrent dislocationInjury location: shoulder Location details: left shoulder Injury type: dislocation Dislocation type: anterior Hill-Sachs deformity: no Chronicity: new Pre-procedure neurovascular assessment: neurovascularly intact  Anesthesia: Local anesthesia used: no  Patient sedated: Yes. Refer to sedation procedure documentation for details of sedation. Manipulation performed: yes Reduction method: traction and counter traction Reduction successful: yes X-ray confirmed reduction: yes Immobilization:  sling Post-procedure neurovascular assessment: post-procedure neurovascularly intact   .Sedation  Date/Time: 04/20/2021 3:22 PM Performed by: Arta Silence, MD Authorized by: Arta Silence, MD   Consent:    Consent obtained:  Written   Consent given by:  Patient   Risks discussed:  Prolonged hypoxia resulting in organ damage, allergic reaction, prolonged sedation necessitating reversal, dysrhythmia, inadequate sedation and respiratory compromise necessitating ventilatory assistance and intubation   Alternatives discussed:  Analgesia without sedation and anxiolysis Universal protocol:    Immediately prior to procedure, a time out was called: yes     Patient identity confirmed:  Arm band and verbally with patient Indications:    Procedure performed:  Dislocation reduction   Procedure necessitating sedation performed by:  Physician performing sedation Pre-sedation assessment:    Time since last food or drink:  6 hours   ASA classification: class 2 - patient with mild systemic disease     Mouth opening:  3 or more finger widths   Mallampati score:  I - soft palate, uvula, fauces, pillars visible   Neck mobility: normal     Pre-sedation assessments completed and reviewed: airway patency, cardiovascular function, mental status and respiratory function   Immediate pre-procedure details:    Reassessment: Patient reassessed immediately prior to procedure   Procedure details (see MAR for exact dosages):    Preoxygenation:  Nasal cannula   Sedation:  Midazolam   Intended level of sedation: moderate (conscious sedation)   Analgesia:  Fentanyl   Intra-procedure monitoring:  Blood pressure monitoring, continuous capnometry, frequent LOC assessments, cardiac monitor, continuous pulse oximetry and frequent vital sign checks   Intra-procedure events: none     Total Provider sedation time (minutes):  10 Post-procedure details:    Post-sedation assessment completed:  04/20/2021 3:23  PM   Attendance: Constant attendance by certified staff until patient recovered     Recovery: Patient returned to pre-procedure baseline     Post-sedation assessments completed and reviewed: mental status and respiratory function     Patient is stable for discharge or admission: yes     Procedure completion:  Tolerated well, no immediate complications  Critical Care performed: No ____________________________________________   INITIAL IMPRESSION / ASSESSMENT AND PLAN / ED COURSE  Pertinent labs & imaging results that were available during my care of the patient were reviewed by me and considered in my medical decision making (see chart for details).   65 year old female with PMH as noted above presents with a left anterior shoulder reduction after mechanical  fall from standing height.  She denies other injuries.  The left upper extremity is neuro/vascular intact on exam.  I was unable to find a computer to open the disc provided by [facility] so we obtained an x-ray here which confirmed anterior shoulder dislocation with no evidence of fracture.  We will perform reduction under moderate sedation.  ----------------------------------------- 3:20 PM on 04/20/2021 -----------------------------------------  Reduction performed successfully under moderate sedation with Versed and fentanyl.  The patient is now alert and oriented and back to her baseline mental status.  She is stable for discharge home.  Postreduction x-ray confirms successful reduction.  I will give her orthopedic referral.  Return precautions given, and she expresses understanding.  ____________________________________________   FINAL CLINICAL IMPRESSION(S) / ED DIAGNOSES  Final diagnoses:  None      NEW MEDICATIONS STARTED DURING THIS VISIT:  New Prescriptions   No medications on file     Note:  This document was prepared using Dragon voice recognition software and may include unintentional dictation errors.     Arta Silence, MD 04/20/21 1524

## 2021-04-20 NOTE — ED Notes (Signed)
See triage note  presents s/p fall  positive deformity of left shoulder  family at bedside

## 2021-04-20 NOTE — ED Provider Notes (Signed)
-----------------------------------------   3:52 PM on 04/20/2021 ----------------------------------------- Patient is requesting something stronger than over-the-counter medications for pain.  Patient has a codeine allergy listed but states she has taken Percocet in the past without ill effect.  We will write for a short course of Percocet for the patient.   Harvest Dark, MD 04/20/21 [phone]

## 2021-04-20 NOTE — ED Triage Notes (Signed)
Pt here via [facility] from [facility] after a fall last night. Pt has a dislocated left shoulder. Pt given 1110mcg of fentanyl by ems. Pt stable on arrival to ED.

## 2021-04-21 ENCOUNTER — Other Ambulatory Visit: Payer: Self-pay | Admitting: Family Medicine

## 2021-04-21 DIAGNOSIS — Z1231 Encounter for screening mammogram for malignant neoplasm of breast: Secondary | ICD-10-CM

## 2021-04-21 NOTE — Telephone Encounter (Signed)
Last refill 08/06/19 by historical provider  Last OV: 11/01/20   No future appts

## 2021-04-27 DIAGNOSIS — M25512 Pain in left shoulder: Secondary | ICD-10-CM | POA: Diagnosis not present

## 2021-04-27 DIAGNOSIS — S43015A Anterior dislocation of left humerus, initial encounter: Secondary | ICD-10-CM | POA: Diagnosis not present

## 2021-04-27 DIAGNOSIS — S7002XA Contusion of left hip, initial encounter: Secondary | ICD-10-CM | POA: Diagnosis not present

## 2021-04-27 DIAGNOSIS — M25552 Pain in left hip: Secondary | ICD-10-CM | POA: Diagnosis not present

## 2021-04-27 DIAGNOSIS — M7582 Other shoulder lesions, left shoulder: Secondary | ICD-10-CM | POA: Diagnosis not present

## 2021-04-27 DIAGNOSIS — S46212A Strain of muscle, fascia and tendon of other parts of biceps, left arm, initial encounter: Secondary | ICD-10-CM | POA: Diagnosis not present

## 2021-05-04 ENCOUNTER — Encounter: Payer: BC Managed Care – PPO | Admitting: Gastroenterology

## 2021-05-05 DIAGNOSIS — S43015D Anterior dislocation of left humerus, subsequent encounter: Secondary | ICD-10-CM | POA: Diagnosis not present

## 2021-05-09 DIAGNOSIS — S43015D Anterior dislocation of left humerus, subsequent encounter: Secondary | ICD-10-CM | POA: Diagnosis not present

## 2021-05-11 ENCOUNTER — Other Ambulatory Visit: Payer: Self-pay | Admitting: Family Medicine

## 2021-05-11 NOTE — Telephone Encounter (Signed)
Last Fill or Written Date and Quantity: 04/13/21 #30  Last Office Visit and Type: 11/01/20 Next Office Visit and Type: no appt scheduled

## 2021-05-12 ENCOUNTER — Telehealth: Payer: Self-pay

## 2021-05-12 ENCOUNTER — Other Ambulatory Visit: Payer: Self-pay | Admitting: Family Medicine

## 2021-05-12 ENCOUNTER — Encounter: Payer: Self-pay | Admitting: Family Medicine

## 2021-05-12 NOTE — Telephone Encounter (Signed)
Patient has gone from infrequent use to what looks like daily use over the last month.   She needs an appointment to discuss worsening anxiety

## 2021-05-12 NOTE — Telephone Encounter (Signed)
I spoke with pt who is still at work; pt said she is not having abd pain; pt said since her fall 3 wks ago pt has had groin pain on and off that has been worsening recently especially when bends over or raises leg. Pt said the pain now is at 3 - 4 and if bends over or raises leg the pain level has gone to 5 - 6. Bearing weight does not make pain worse. Pt does not think the skin in groin is warmer  than her other skin; pt is at work but does not think there is redness or swelling at this time. Pt said when she gets home she can ck more thoroughly and will also ck to see if any bulges or knot. Pt is not having CP or SOB.  Pt does not want to go to UC or ED because she was already there when she displaced her shoulder 3 wks ago.pt said she just wants to be OK. I advised pt the groin pain can be lots of different things such as pulled muscle or hernia or concern of blood clot due to fall. Pt said she will go home after work and discuss with her husband and decide what she is going to do. UC & ED precautions given and pt voiced understanding and was appreciative. Sending note to Dr Einar Pheasant and [facility] CMA. Will also teams [place].

## 2021-05-12 NOTE — Telephone Encounter (Signed)
Agree with ER/UC precautions. Would recommend evaluation in office tomorrow if symptoms persisting and appointment available.   Routing to schedules to call and offer available appointments tomorrow

## 2021-05-12 NOTE — Telephone Encounter (Signed)
[facility] - Client TELEPHONE ADVICE RECORD [facility] Patient Name: Caitlin Rogers Gender: Female DOB: 07/02/1956 Age: 64 Y 76 M Return Phone Number: [phone] (Primary) Address: City/ State/ Zip: [place] [address] Client [facility] - Client Client Site [place] - Day Physician Waunita Schooner- MD Contact Type Call Who Is Calling Patient / Member / Family / Caregiver Call Type Triage / Clinical Relationship To Patient Self Return Phone Number [phone] (Primary) Chief Complaint SEVERE ABDOMINAL PAIN - Severe pain in abdomen Reason for Call Symptomatic / Request for [name] states her lower abdomen is hurting and she cannot lift her leg when she sits and cannot cross her legs. Her left groin area is painful. Additional Comment At times the pain is severe. Translation No Nurse Assessment Nurse: Windle Guard, RN, Olin Hauser Date/Time (Eastern Time): 05/12/2021 2:28:16 PM Confirm and document reason for call. If symptomatic, describe symptoms. ---Caller states she has severe lower abdominal/ left groin pain. States when she lifts her left leg the pain increases. States she has had this off and on, but has intensified in the last couple of weeks. Also states she had a fall 2 weeks ago, had hip x ray at that time which was negative. States she is able to ambulate. States pain worst when trying to lift leg or bend over. Does the patient have any new or worsening symptoms? ---Yes Will a triage be completed? ---Yes Related visit to physician within the last 2 weeks? ---Yes Does the PT have any chronic conditions? (i.e. diabetes, asthma, this includes High risk factors for pregnancy, etc.) ---Yes List chronic conditions. ---HTN, Atherosclerosis, elevated liver enzymes Is this a behavioral health or substance abuse call? ---No Guidelines Guideline Title  Affirmed Question Affirmed Notes Nurse Date/Time (Eastern Time) Abdominal Pain - Female [1] SEVERE pain (e.g., excruciating) AND [2] present > 1 hour Conner, RN, Olin Hauser 05/12/2021 2:34:50 PM PLEASE NOTE: All timestamps contained within this report are represented as [place] Standard Time. CONFIDENTIALTY NOTICE: This fax transmission is intended only for the addressee. It contains information that is legally privileged, confidential or otherwise protected from use or disclosure. If you are not the intended recipient, you are strictly prohibited from reviewing, disclosing, copying using or disseminating any of this information or taking any action in reliance on or regarding this information. If you have received this fax in error, please notify us immediately by telephone so that we can arrange for its return to [place]. Phone: [phone], Toll-Free: [phone], Fax: [phone] Page: 2 of 2 Call Id: [id number] [place]. Time [name] Time) Disposition Final User 05/12/2021 2:26:22 PM Send to Urgent Harrie Jeans 05/12/2021 2:36:40 PM Go to ED Now Yes Windle Guard, RN, Otho Najjar Disagree/Comply Comply Caller Understands Yes PreDisposition Did not know what to do Care Advice Given Per Guideline GO TO ED NOW: ANOTHER ADULT SHOULD DRIVE: * It is better and safer if another adult drives instead of you. BRING MEDICINES: * Bring a list of your current medicines when you go to the Emergency Department (ER). * Bring the pill bottles too. This will help the doctor (or NP/PA) to make certain you are taking the right medicines and the right dose. NOTHING BY MOUTH: * Do not eat or drink anything for now. CARE ADVICE given per Abdominal Pain, Female (Adult) guideline. Referrals GO TO FACILITY UNDECIDE

## 2021-05-12 NOTE — Telephone Encounter (Signed)
Called patient , number is not in service. Sent letter via [employer]

## 2021-05-13 ENCOUNTER — Other Ambulatory Visit: Payer: Self-pay

## 2021-05-13 ENCOUNTER — Emergency Department
Admission: EM | Admit: 2021-05-13 | Discharge: 2021-05-13 | Disposition: A | Discharge status: Home / Self Care | Payer: BC Managed Care – PPO | Attending: Emergency Medicine | Admitting: Emergency Medicine

## 2021-05-13 ENCOUNTER — Emergency Department: Payer: BC Managed Care – PPO

## 2021-05-13 DIAGNOSIS — Z87891 Personal history of nicotine dependence: Secondary | ICD-10-CM | POA: Insufficient documentation

## 2021-05-13 DIAGNOSIS — M25552 Pain in left hip: Secondary | ICD-10-CM

## 2021-05-13 DIAGNOSIS — I10 Essential (primary) hypertension: Secondary | ICD-10-CM | POA: Insufficient documentation

## 2021-05-13 DIAGNOSIS — M1612 Unilateral primary osteoarthritis, left hip: Secondary | ICD-10-CM | POA: Diagnosis not present

## 2021-05-13 DIAGNOSIS — W19XXXA Unspecified fall, initial encounter: Secondary | ICD-10-CM | POA: Insufficient documentation

## 2021-05-13 DIAGNOSIS — Z8616 Personal history of COVID-19: Secondary | ICD-10-CM | POA: Diagnosis not present

## 2021-05-13 DIAGNOSIS — Z7982 Long term (current) use of aspirin: Secondary | ICD-10-CM | POA: Diagnosis not present

## 2021-05-13 DIAGNOSIS — R102 Pelvic and perineal pain: Secondary | ICD-10-CM | POA: Insufficient documentation

## 2021-05-13 DIAGNOSIS — Z79899 Other long term (current) drug therapy: Secondary | ICD-10-CM | POA: Diagnosis not present

## 2021-05-13 MED ORDER — TRAMADOL HCL 50 MG PO TABS: 50.0000 mg | ORAL_TABLET | Freq: Two times a day (BID) | ORAL | 0 refills | Status: DC | PRN

## 2021-05-13 NOTE — ED Notes (Signed)
See triage note presents with pain to left groin area     states she fell about 1 month ago  dislocated her left shoulder   has had pain since  did have her hip x-rayed couple of weeks ago  but pain cont's  ambulates well  no urinary sxs;

## 2021-05-13 NOTE — ED Provider Notes (Signed)
[facility] Emergency Department Provider Note   ____________________________________________   Event Date/Time   First MD Initiated Contact with Patient 05/13/21 1124     (approximate)  I have reviewed the triage vital signs and the nursing notes.   HISTORY  Chief Complaint Fall and Pelvic Pain    HPI Caitlin Rogers is a 65 y.o. female patient complain continued hip pain status post fall on April 19, 2021.  Patient states she sustained a dislocated shoulder secondary to the fall.  Patient states she did not have her x-ray of the hip pelvic on the day of injury.  Patient states on 04/27/2021 she had x-rays of the pelvis with no acute findings.  Patient continues to have pain.  Patient stated pain is worse in the last 2 days.  Rates pain as a 6/10.  Described pain as "achy".  Taken extra strength Tylenol for pain.         Past Medical History:  Diagnosis Date   Anxiety    Aortic atherosclerosis ([place]) 09/29/2020   On coronary CT    Arthritis    Atypical chest pain 11/11/2016   Cardiac murmur 01/19/2020   Cervical radiculopathy 02/13/2020   Chest discomfort 01/19/2020   Coronary artery calcification 10/07/2020   COVID-19 06/2019   Current moderate episode of major depressive disorder without prior episode ([place]) 07/06/2020   Dermatitis 11/01/2020   Essential hypertension 11/11/2016   GERD (gastroesophageal reflux disease) 11/11/2016   Hyperlipemia    Hyperlipidemia 11/11/2016   Hypertension    Numbness 01/02/2020   Other spondylosis with myelopathy, cervical region 01/19/2020   Palpitations 11/11/2016   Ringing in the ears    post covid symptom    Patient Active Problem List   Diagnosis Date Noted   Dermatitis 11/01/2020   Coronary artery calcification 10/07/2020   Aortic atherosclerosis ([place]) 09/29/2020   Hypertension    Ringing in the ears    Current moderate episode of major depressive disorder without prior episode ([place]) 07/06/2020   Anxiety     Arthritis    Hyperlipemia    Cervical radiculopathy 02/13/2020   Chest discomfort 01/19/2020   Cardiac murmur 01/19/2020   Other spondylosis with myelopathy, cervical region 01/19/2020   Numbness 01/02/2020   COVID-19 06/2019   Essential hypertension 11/11/2016   Hyperlipidemia 11/11/2016   GERD (gastroesophageal reflux disease) 11/11/2016   Palpitations 11/11/2016   Atypical chest pain 11/11/2016    Past Surgical History:  Procedure Laterality Date   APPENDECTOMY     BUNIONECTOMY     bilat   CERVICAL SPINE SURGERY     CESAREAN SECTION     in toe   COLONOSCOPY     MOUTH SURGERY  2019   implant with crown   POLYPECTOMY     TONSILLECTOMY     TUBAL LIGATION     UPPER GASTROINTESTINAL ENDOSCOPY     WISDOM TOOTH EXTRACTION      Prior to Admission medications   Medication Sig Start Date End Date Taking? Authorizing Provider  traMADol (ULTRAM) 50 MG tablet Take 1 tablet (50 mg total) by mouth every 12 (twelve) hours as needed. 05/13/21  Yes Sable Feil, PA-C  ALPRAZolam Duanne Moron) 0.5 MG tablet TAKE ONE TABLET BY MOUTH TWICE A DAY AS NEEDED FOR ANXIETY 04/13/21   Lesleigh Noe, MD  aspirin EC 81 MG tablet Take 1 tablet (81 mg total) by mouth daily. Swallow whole. 10/07/20   Revankar, Reita Cliche, MD  diclofenac Sodium (VOLTAREN)  1 % GEL Apply 1 application topically daily at 12 noon.    [provider]  metoprolol succinate (TOPROL-XL) 25 MG 24 hr tablet TAKE ONE TABLET BY MOUTH ONE TIME DAILY 04/21/21   Lesleigh Noe, MD  naproxen sodium (ALEVE) 220 MG tablet Take 220 mg by mouth 2 (two) times daily as needed for headache (for headaches).    [provider]  olmesartan-hydrochlorothiazide (BENICAR HCT) 40-12.5 MG tablet Take 0.5 tablets by mouth daily. 09/14/20   [provider]  omeprazole (PRILOSEC) 40 MG capsule TAKE ONE CAPSULE BY MOUTH ONE TIME DAILY 04/11/21   Ladene Artist, MD  rosuvastatin (CRESTOR) 20 MG tablet Take 20 mg by mouth daily.     [provider]  sertraline (ZOLOFT) 100 MG tablet Take 1 tablet (100 mg total) by mouth every morning. 01/18/21   Lesleigh Noe, MD  triamcinolone cream (KENALOG) 0.1 % Apply 1 application topically 2 (two) times daily. 11/01/20   Lesleigh Noe, MD    Allergies Codeine  Family History  Problem Relation Age of Onset   Colon cancer Paternal Aunt    Hypertension Mother    Diabetes Mellitus II Mother    Hypertension Father    Stroke Father    Heart disease Father    Stroke Brother    Heart disease Brother    Heart attack Paternal Grandfather    CAD Brother    Colon polyps Neg Hx    Esophageal cancer Neg Hx    Rectal cancer Neg Hx    Stomach cancer Neg Hx     Social History Social History   Tobacco Use   Smoking status: Former   Smokeless tobacco: Never  Scientific laboratory technician Use: Never used  Substance Use Topics   Alcohol use: Yes    Comment: 1 glass of wine, and 2 oz of [place]    Drug use: No    Review of Systems Constitutional: No fever/chills Eyes: No visual changes. ENT: No sore throat. Cardiovascular: Denies chest pain. Respiratory: Denies shortness of breath. Gastrointestinal: No abdominal pain.  No nausea, no vomiting.  No diarrhea.  No constipation. Genitourinary: Negative for dysuria. Musculoskeletal: Left pelvic pain.   Skin: Negative for rash. Neurological: Negative for headaches, focal weakness or numbness. Psychiatric: Anxiety. Endocrine: Hyperlipidemia and hypertension. Allergic/Immunilogical: Codeine. ____________________________________________   PHYSICAL EXAM:  VITAL SIGNS: ED Triage Vitals  Enc Vitals Group     BP 05/13/21 1033 135/71     Pulse Rate 05/13/21 1033 81     Resp 05/13/21 1033 16     Temp 05/13/21 1033 98.2 F (36.8 C)     Temp Source 05/13/21 1033 Oral     SpO2 05/13/21 1033 96 %     Weight 05/13/21 1034 165 lb (74.8 kg)     Height 05/13/21 1034 5\' 3"  (1.6 m)     Head Circumference --      Peak Flow --       Pain Score 05/13/21 1034 6     Pain Loc --      Pain Edu? --      Excl. in [place]? --     Constitutional: Alert and oriented. Well appearing and in no acute distress. Cardiovascular: Normal rate, regular rhythm. Grossly normal heart sounds.  Good peripheral circulation. Respiratory: Normal respiratory effort.  No retractions. Lungs CTAB. Gastrointestinal: Soft and nontender. No distention. No abdominal bruits. No CVA tenderness. Genitourinary: Deferred Musculoskeletal: No obvious deformity.  No  leg length discrepancy.  Patient has moderate guarding palpation of the left inguinal area. Neurologic:  Normal speech and language. No gross focal neurologic deficits are appreciated. No gait instability. Skin:  Skin is warm, dry and intact. No rash noted. Psychiatric: Mood and affect are normal. Speech and behavior are normal.  ____________________________________________   LABS (all labs ordered are listed, but only abnormal results are displayed)  Labs Reviewed - No data to display ____________________________________________  EKG   ____________________________________________  RADIOLOGY I, Sable Feil, personally viewed and evaluated these images (plain radiographs) as part of my medical decision making, as well as reviewing the written report by the radiologist.  ED MD interpretation:    Official radiology report(s): CT Hip Left Wo Contrast  Result Date: 05/13/2021 CLINICAL DATA:  Persistent left groin pain since fall a few weeks ago. EXAM: CT OF THE LEFT HIP WITHOUT CONTRAST TECHNIQUE: Multidetector CT imaging of the left hip was performed according to the standard protocol. Multiplanar CT image reconstructions were also generated. COMPARISON:  None. FINDINGS: Bones/Joint/Cartilage No fracture or dislocation. Mild degenerative changes of the left hip with faint chondrocalcinosis. No joint effusion. Ligaments Ligaments are suboptimally evaluated by CT. Muscles and Tendons  Grossly intact. Soft tissue No fluid collection or hematoma.  No soft tissue mass. IMPRESSION: 1. No acute osseous abnormality. Electronically Signed   By: Titus Dubin M.D.   On: 05/13/2021 12:06    ____________________________________________   PROCEDURES  Procedure(s) performed (including Critical Care):  Procedures   ____________________________________________   INITIAL IMPRESSION / ASSESSMENT AND PLAN / ED COURSE  As part of my medical decision making, I reviewed the following data within the [place]       Patient complain of 4 weeks of left pelvic pain secondary to a fall.  Review of x-rays were negative.  Discussed no acute findings on CT that was taken today.  Patient complaint and exam consistent with musculoskeletal  pain.  Patient given discharge care instruction.  Patient also given a prescription for tramadol to take every 12 hours as needed for pain.  Advised to follow-up with PCP due to the continued nature of her complaint.  ____________________________________________   FINAL CLINICAL IMPRESSION(S) / ED DIAGNOSES  Final diagnoses:  Pelvic joint pain, left     ED Discharge Orders          Ordered    traMADol (ULTRAM) 50 MG tablet  Every 12 hours PRN        05/13/21 1235             Note:  This document was prepared using Dragon voice recognition software and may include unintentional dictation errors.    Sable Feil, PA-C 05/13/21 1531    Harvest Dark, MD 05/14/21 [phone]

## 2021-05-13 NOTE — Telephone Encounter (Signed)
Pt scheduled f/u apt on 10.31.22

## 2021-05-13 NOTE — Discharge Instructions (Addendum)
No acute bony abnormalities on CT scan of the left pelvic.  Read and follow discharge care instruction.  Take medication as directed follow-up with PCP.

## 2021-05-13 NOTE — ED Triage Notes (Signed)
Pt states that she fell sept 27th and states that she had pain in her left groin and pelvic area, pt states that she sustained a dislocated shoulder that was repaired, but pt states that she cont to have the pain in her left pelvic area

## 2021-05-17 DIAGNOSIS — S43015D Anterior dislocation of left humerus, subsequent encounter: Secondary | ICD-10-CM | POA: Diagnosis not present

## 2021-05-19 ENCOUNTER — Ambulatory Visit: Payer: BC Managed Care – PPO | Admitting: Family Medicine

## 2021-05-23 ENCOUNTER — Other Ambulatory Visit: Payer: Self-pay

## 2021-05-23 ENCOUNTER — Ambulatory Visit: Payer: BC Managed Care – PPO | Admitting: Family Medicine

## 2021-05-23 ENCOUNTER — Encounter: Payer: Self-pay | Admitting: Family Medicine

## 2021-05-23 VITALS — BP 150/72 | HR 79 | Temp 97.0°F | Ht 63.0 in | Wt 167.0 lb

## 2021-05-23 DIAGNOSIS — F321 Major depressive disorder, single episode, moderate: Secondary | ICD-10-CM

## 2021-05-23 DIAGNOSIS — F419 Anxiety disorder, unspecified: Secondary | ICD-10-CM | POA: Diagnosis not present

## 2021-05-23 DIAGNOSIS — M25552 Pain in left hip: Secondary | ICD-10-CM | POA: Diagnosis not present

## 2021-05-23 DIAGNOSIS — Z23 Encounter for immunization: Secondary | ICD-10-CM

## 2021-05-23 MED ORDER — SERTRALINE HCL 50 MG PO TABS: 75.0000 mg | ORAL_TABLET | Freq: Every day | ORAL | 0 refills | Status: DC

## 2021-05-23 NOTE — Assessment & Plan Note (Signed)
Taper zoloft to stop as well controlled.

## 2021-05-23 NOTE — Patient Instructions (Addendum)
Hip - I think you injured your hip flexor - referral to PT to help   Sertraline - new prescription - 50 mg tablet  Tapering antidepressant general guidelines:   1) Monitor for signs of return of anxiety or depression 2) Monitor for signs of withdrawal from antidepressant  -- Dizziness ?Fatigue ?Headache ?Nausea ?Agitation ?Anxiety ?Chills without fever ?Diaphoresis ?Dysphoria ?Electric shock-like sensations ("brain zaps") ?Insomnia ?Irritability ?Myalgias ?Paresthesias ?Rhinorrhea ?Tremor ?Vivid dreams  3) Decrease to 75 mg of zoloft. If no withdrawal symptoms, can decrease to 50 mg after 2 weeks.Then to 25 mg after 2 weeks if still doing well. Then stop. If mild withdrawal symptoms, remain at dose longer before decrease. If significant, return to prior dose for another 1-2 weeks before decreasing again.   4) If you have return of anxiety/depression - then call

## 2021-05-23 NOTE — Progress Notes (Signed)
Subjective:     Caitlin Rogers is a 65 y.o. female presenting for Fall (04/19/21) and Abdominal Pain (Left lower and into groin since fall. Hurts more when sitting and lifting left leg. )     Fall Associated symptoms include abdominal pain.  Abdominal Pain   #Fall - 04/19/2021 - was getting painting done at the house - tripped on a piece of plastic with her foot and fell onto a firm chair  - did have pain on the left arm - which took the majority of the fall - pain is lower abdomen and upper thigh on the left side - has been present since the fall - initially was coming and going but now is more constant - normal BM - worse with lifting the leg - like putting on pants or shoes - pain is in the lower abdomen/leg  ER 04/20/2021 - shoulder dislocation - with reduction  04/27/2021: ortho - normal hip XR - suspect contusion  05/13/2021: ER - normal CT of hip  Review of Systems  Gastrointestinal:  Positive for abdominal pain.    Social History   Tobacco Use  Smoking Status Former  Smokeless Tobacco Never        Objective:    BP Readings from Last 3 Encounters:  05/23/21 (!) 150/72  05/13/21 135/71  04/20/21 (!) 146/77   Wt Readings from Last 3 Encounters:  05/23/21 167 lb (75.8 kg)  05/13/21 165 lb (74.8 kg)  04/20/21 170 lb (77.1 kg)    BP (!) 150/72   Pulse 79   Temp (!) 97 F (36.1 C) (Temporal)   Ht 5\' 3"  (1.6 m)   Wt 167 lb (75.8 kg)   SpO2 98%   BMI 29.58 kg/m    Physical Exam Constitutional:      General: She is not in acute distress.    Appearance: She is well-developed. She is not diaphoretic.  HENT:     Right Ear: External ear normal.     Left Ear: External ear normal.     Nose: Nose normal.  Eyes:     Conjunctiva/sclera: Conjunctivae normal.  Cardiovascular:     Rate and Rhythm: Normal rate and regular rhythm.  Pulmonary:     Effort: Pulmonary effort is normal. No respiratory distress.     Breath sounds: Normal breath sounds. No  wheezing.  Abdominal:     General: Abdomen is flat. Bowel sounds are normal. There is no distension.     Palpations: Abdomen is soft.     Tenderness: There is no abdominal tenderness.     Hernia: No hernia is present.  Musculoskeletal:     Cervical back: Neck supple.     Comments: TTP along the left hip flexor abdominal attachment and worsening with hip flexion.  Left hip Inspection: no abnormalities ROM: normal hip rom though pain with hip flexion Strength: normal   Skin:    General: Skin is warm and dry.     Capillary Refill: Capillary refill takes less than 2 seconds.  Neurological:     Mental Status: She is alert. Mental status is at baseline.  Psychiatric:        Mood and Affect: Mood normal.        Behavior: Behavior normal.          Assessment & Plan:   Problem List Items Addressed This Visit       Other   Anxiety    Taper zoloft to stop as well controlled.  Relevant Medications   sertraline (ZOLOFT) 50 MG tablet   Current moderate episode of major depressive disorder without prior episode ([place])    Well controlled. Would like to work towards coming off zoloft. 100 mg > 75 mg prescribed with instructions for how to stop.       Relevant Medications   sertraline (ZOLOFT) 50 MG tablet   Left hip pain - Primary    After fall. Suspect hip flexor strain. Reviewed ER visit and Ortho visit with normal XR and CT scan. Discussed likely muscle related and referral to PT. Advised voltaren gel prn. F/u with ortho if no improvement      Relevant Orders   Ambulatory referral to Physical Therapy   Other Visit Diagnoses     Need for influenza vaccination       Relevant Orders   Flu Vaccine QUAD 84mo+IM (Fluarix, Fluzone & Alfiuria Quad PF) (Completed)        Return in about 3 months (around 09/06/2021) for annual physical .  Lesleigh Noe, MD  This visit occurred during the SARS-CoV-2 public health emergency.  Safety protocols were in place, including  screening questions prior to the visit, additional usage of staff PPE, and extensive cleaning of exam room while observing appropriate contact time as indicated for disinfecting solutions.

## 2021-05-23 NOTE — Assessment & Plan Note (Signed)
Well controlled. Would like to work towards coming off zoloft. 100 mg > 75 mg prescribed with instructions for how to stop.

## 2021-05-23 NOTE — Assessment & Plan Note (Signed)
After fall. Suspect hip flexor strain. Reviewed ER visit and Ortho visit with normal XR and CT scan. Discussed likely muscle related and referral to PT. Advised voltaren gel prn. F/u with ortho if no improvement

## 2021-05-23 NOTE — Assessment & Plan Note (Signed)
>>  ASSESSMENT AND PLAN FOR DEPRESSION, MAJOR, SINGLE EPISODE, COMPLETE REMISSION (HCC) WRITTEN ON 05/23/2021  4:41 PM BY CODY, JESSICA R, MD  Well controlled. Would like to work towards coming off zoloft. 100 mg > 75 mg prescribed with instructions for how to stop.

## 2021-05-24 ENCOUNTER — Telehealth: Payer: Self-pay | Admitting: Family Medicine

## 2021-05-24 NOTE — Telephone Encounter (Signed)
Patient notified as instructed by telephone and verbalized understanding. Patient stated that she has reached out to ortho and will be seeing them next week.

## 2021-05-24 NOTE — Telephone Encounter (Signed)
Sorry that she is having a flare of pain.   Agree with reaching out to Ortho.   Would also recommend:   1) Ice 2) Rest 3) Ibuprofen 800 mg every 8 hours 4) Ok to take tramadol as needed 5) continue with plan for physical therapy 6) Can also consider voltaren gel if needed  If worsening and unable to see Ortho may be reasonable to go to the ER especially if no improvement

## 2021-05-24 NOTE — Telephone Encounter (Signed)
Pt called she was seen yesterday and she is now having sever hip pain she said that it is coming from Dr. Einar Pheasant yesterday manipulating it to figure out what was going on with it. Pt stated that her pain is at an 8 and wanted to know if something could be called in

## 2021-05-24 NOTE — Telephone Encounter (Signed)
PLEASE NOTE: All timestamps contained within this report are represented as [place] Standard Time. CONFIDENTIALTY NOTICE: This fax transmission is intended only for the addressee. It contains information that is legally privileged, confidential or otherwise protected from use or disclosure. If you are not the intended recipient, you are strictly prohibited from reviewing, disclosing, copying using or disseminating any of this information or taking any action in reliance on or regarding this information. If you have received this fax in error, please notify us immediately by telephone so that we can arrange for its return to [place]. Phone: [phone], Toll-Free: [phone], Fax: [phone] Page: 1 of 2 Call Id: [id number] [facility] - Client TELEPHONE ADVICE RECORD [facility] Patient Name: Caitlin Rogers Gender: Female DOB: 28-Nov-1955 Age: 65 Y 10 M 12 D Return Phone Number: [phone] (Primary) Address: City/ State/ Zip: [place] [address] Client [facility] - Client Client Site [place] - Day Physician Waunita Schooner- MD Contact Type Call Who Is Calling Patient / Member / Family / Caregiver Call Type Triage / Clinical Relationship To Patient Self Return Phone Number [phone] (Primary) Chief Complaint Hip Injury Reason for Call Symptomatic / Request for [place] fell last month; she injured her shoulder, hip, and groin. She has trouble standing up and getting out of bed. Translation No Nurse Assessment Nurse: Markus Daft, RN, Sherre Poot Date/Time (Eastern Time): 05/24/2021 10:37:32 AM Confirm and document reason for call. If symptomatic, describe symptoms. ---Caller states that she fell 04/19/21 (walking in her house, and tripped on a rug/plastic liner for rolling chair falling hard on the chair, and ended on the floor), and injured her left shoulder, left hip,  and left groin. She has trouble standing up and getting OOB. Seen by MD and she was moving areas, and this made her very sore noticed by the time she got home. Advised by MD to f/u with orthopedics. She's called them but waiting for a call back. - Last night, and this am, she took Tramadol (from a previous ER visit) and Ibuprofen 600 mg. She was able to sleep, but had a hard time getting up to bathroom around 2 am. This am, required her husband's help. -- CT scans, x-rays are normal. Does the patient have any new or worsening symptoms? ---Yes Will a triage be completed? ---Yes Related visit to physician within the last 2 weeks? ---Yes Does the PT have any chronic conditions? (i.e. diabetes, asthma, this includes High risk factors for pregnancy, etc.) ---Yes List chronic conditions. ---HTN, Atherosclerosis, elevated liver enzymes, arthritis Is this a behavioral health or substance abuse call? ---No PLEASE NOTE: All timestamps contained within this report are represented as [place] Standard Time. CONFIDENTIALTY NOTICE: This fax transmission is intended only for the addressee. It contains information that is legally privileged, confidential or otherwise protected from use or disclosure. If you are not the intended recipient, you are strictly prohibited from reviewing, disclosing, copying using or disseminating any of this information or taking any action in reliance on or regarding this information. If you have received this fax in error, please notify us immediately by telephone so that we can arrange for its return to [place]. Phone: [phone], Toll-Free: [phone], Fax: [phone] Page: 2 of 2 Call Id: [id number] Guidelines Guideline Title Affirmed Question Affirmed Notes Nurse Date/Time [name] Time) Groin Injury and Strain [1] SEVERE pain (e.g., excruciating) AND [2] not improved 2 hours after pain medicine/ ice pack Markus Daft, Therapist, sports, [place]  05/24/2021 10:47:06 AM Disp. Time  [name] Time) Disposition Final User 05/24/2021 10:12:34 AM Attempt made - message left Jackqulyn Livings 05/24/2021 10:51:13 AM Called On-Call Provider Markus Daft, RN, Sherre Poot Reason: No answer at back line #. 05/24/2021 10:54:11 AM Called On-Call Provider Markus Daft, RN, Sherre Poot Reason: Long hold time, RN transferred patient to hold. 05/24/2021 10:48:42 AM See HCP within 4 Hours (or PCP triage) Yes Markus Daft, RN, Sherre Poot Caller Disagree/Comply Comply Caller Understands Yes PreDisposition Go to Urgent Care/Walk-In Clinic Care Advice Given Per Guideline SEE HCP (OR PCP TRIAGE) WITHIN 4 HOURS: * IF OFFICE WILL BE OPEN: You need to be seen within the next 3 or 4 hours. Call your doctor (or NP/PA) now or as soon as the office opens. USE A COLD PACK FOR PAIN, SWELLING, OR BRUISING: * Put a cold pack or an ice bag (wrapped in a moist towel) on the area for 20 minutes. * Repeat in 1 hour, then as needed. * This will help decrease pain, swelling, and bruising. PAIN MEDICINES: * For pain relief, you can take either acetaminophen, ibuprofen, or naproxen. * Use the lowest amount of medicine that makes your pain better. CALL BACK IF: * You become worse CARE ADVICE given per Groin Injury and Strain (Adult) guideline. Comments User: Mayford Knife, RN Date/Time [name] Time): 05/24/2021 10:48:01 AM She's using heat and cold compresses. Referrals REFERRED TO PCP OFFICE

## 2021-05-27 ENCOUNTER — Other Ambulatory Visit: Payer: Self-pay

## 2021-05-27 ENCOUNTER — Ambulatory Visit
Admission: RE | Admit: 2021-05-27 | Discharge: 2021-05-27 | Disposition: A | Discharge status: Home / Self Care | Payer: BC Managed Care – PPO | Source: Ambulatory Visit | Attending: Family Medicine | Admitting: Family Medicine

## 2021-05-27 DIAGNOSIS — S43015D Anterior dislocation of left humerus, subsequent encounter: Secondary | ICD-10-CM | POA: Diagnosis not present

## 2021-05-27 DIAGNOSIS — Z1231 Encounter for screening mammogram for malignant neoplasm of breast: Secondary | ICD-10-CM | POA: Diagnosis not present

## 2021-05-31 ENCOUNTER — Encounter: Payer: Self-pay | Admitting: Gastroenterology

## 2021-05-31 ENCOUNTER — Other Ambulatory Visit: Payer: Self-pay

## 2021-05-31 ENCOUNTER — Telehealth: Payer: Self-pay | Admitting: *Deleted

## 2021-05-31 ENCOUNTER — Ambulatory Visit (AMBULATORY_SURGERY_CENTER): Payer: BC Managed Care – PPO | Admitting: *Deleted

## 2021-05-31 VITALS — Ht 63.0 in | Wt 167.0 lb

## 2021-05-31 DIAGNOSIS — Z800 Family history of malignant neoplasm of digestive organs: Secondary | ICD-10-CM

## 2021-05-31 DIAGNOSIS — Z86010 Personal history of colonic polyps: Secondary | ICD-10-CM

## 2021-05-31 MED ORDER — NA SULFATE-K SULFATE-MG SULF 17.5-3.13-1.6 GM/177ML PO SOLN: 1.0000 | ORAL | 0 refills | Status: DC

## 2021-05-31 NOTE — Telephone Encounter (Signed)
Called patient x2 for PV no answer, left a message.

## 2021-05-31 NOTE — Telephone Encounter (Signed)
Pt stated she can do 11:30 and she will by the phone.

## 2021-05-31 NOTE — Progress Notes (Signed)
Patient's pre-visit was done today over the phone with the patient. Name,DOB and address verified. Patient denies any allergies to Eggs and Soy. Patient denies any problems with anesthesia/sedation. Patient is not taking any diet pills or blood thinners. No home Oxygen. Packet of Prep instructions mailed to patient including a copy of a consent form-pt is aware. Patient understands to call us back with any questions or concerns. Patient is aware of our care-partner policy and Covid-19 safety protocol.   EMMI education assigned to the patient for the procedure, sent to MyChart.   The patient is COVID-19 vaccinated.   

## 2021-05-31 NOTE — Telephone Encounter (Signed)
Patient called for phone PV, no answer x2, left message for the patient to return my call.

## 2021-06-03 ENCOUNTER — Other Ambulatory Visit: Payer: Self-pay | Admitting: Gastroenterology

## 2021-06-03 ENCOUNTER — Other Ambulatory Visit: Payer: Self-pay | Admitting: Family Medicine

## 2021-06-03 DIAGNOSIS — S43015D Anterior dislocation of left humerus, subsequent encounter: Secondary | ICD-10-CM | POA: Diagnosis not present

## 2021-06-03 DIAGNOSIS — F321 Major depressive disorder, single episode, moderate: Secondary | ICD-10-CM

## 2021-06-03 DIAGNOSIS — F419 Anxiety disorder, unspecified: Secondary | ICD-10-CM

## 2021-06-07 ENCOUNTER — Other Ambulatory Visit: Payer: Self-pay | Admitting: Student

## 2021-06-07 ENCOUNTER — Other Ambulatory Visit (HOSPITAL_BASED_OUTPATIENT_CLINIC_OR_DEPARTMENT_OTHER): Payer: Self-pay | Admitting: Student

## 2021-06-07 DIAGNOSIS — M7582 Other shoulder lesions, left shoulder: Secondary | ICD-10-CM

## 2021-06-07 DIAGNOSIS — S43015A Anterior dislocation of left humerus, initial encounter: Secondary | ICD-10-CM

## 2021-06-08 DIAGNOSIS — H5213 Myopia, bilateral: Secondary | ICD-10-CM | POA: Diagnosis not present

## 2021-06-08 DIAGNOSIS — H2513 Age-related nuclear cataract, bilateral: Secondary | ICD-10-CM | POA: Diagnosis not present

## 2021-06-10 ENCOUNTER — Other Ambulatory Visit: Payer: Self-pay | Admitting: Family Medicine

## 2021-06-10 ENCOUNTER — Other Ambulatory Visit: Payer: Self-pay | Admitting: Gastroenterology

## 2021-06-10 DIAGNOSIS — Z86010 Personal history of colonic polyps: Secondary | ICD-10-CM

## 2021-06-10 DIAGNOSIS — Z800 Family history of malignant neoplasm of digestive organs: Secondary | ICD-10-CM

## 2021-06-14 ENCOUNTER — Other Ambulatory Visit: Payer: Self-pay | Admitting: Gastroenterology

## 2021-06-14 ENCOUNTER — Ambulatory Visit (AMBULATORY_SURGERY_CENTER): Payer: BC Managed Care – PPO | Admitting: Gastroenterology

## 2021-06-14 ENCOUNTER — Other Ambulatory Visit: Payer: Self-pay

## 2021-06-14 ENCOUNTER — Other Ambulatory Visit (INDEPENDENT_AMBULATORY_CARE_PROVIDER_SITE_OTHER): Payer: BC Managed Care – PPO

## 2021-06-14 ENCOUNTER — Encounter: Payer: Self-pay | Admitting: Gastroenterology

## 2021-06-14 VITALS — BP 131/70 | HR 71 | Temp 96.0°F | Resp 12 | Ht 63.0 in | Wt 167.0 lb

## 2021-06-14 DIAGNOSIS — R7989 Other specified abnormal findings of blood chemistry: Secondary | ICD-10-CM | POA: Diagnosis not present

## 2021-06-14 DIAGNOSIS — E785 Hyperlipidemia, unspecified: Secondary | ICD-10-CM

## 2021-06-14 DIAGNOSIS — D123 Benign neoplasm of transverse colon: Secondary | ICD-10-CM | POA: Diagnosis not present

## 2021-06-14 DIAGNOSIS — Z86010 Personal history of colonic polyps: Secondary | ICD-10-CM

## 2021-06-14 DIAGNOSIS — D124 Benign neoplasm of descending colon: Secondary | ICD-10-CM | POA: Diagnosis not present

## 2021-06-14 LAB — LIPID PANEL
Cholesterol: 204 mg/dL — ABNORMAL HIGH (ref 0–200)
HDL: 98.5 mg/dL (ref >39.00)
LDL Cholesterol: 75 mg/dL (ref 0–99)
NonHDL: 105.26
Total CHOL/HDL Ratio: 2
Triglycerides: 149 mg/dL (ref 0.0–149.0)
VLDL: 29.8 mg/dL (ref 0.0–40.0)

## 2021-06-14 LAB — HEPATIC FUNCTION PANEL
ALT: 21 U/L (ref 0–35)
AST: 39 U/L — ABNORMAL HIGH (ref 0–37)
Albumin: 4.2 g/dL (ref 3.5–5.2)
Alkaline Phosphatase: 65 U/L (ref 39–117)
Bilirubin, Direct: 0.1 mg/dL (ref 0.0–0.3)
Total Bilirubin: 0.5 mg/dL (ref 0.2–1.2)
Total Protein: 7.2 g/dL (ref 6.0–8.3)

## 2021-06-14 MED ORDER — SODIUM CHLORIDE 0.9 % IV SOLN
500.0000 mL | Freq: Once | INTRAVENOUS | Status: DC
Start: 2021-06-14 — End: 2021-06-14

## 2021-06-14 NOTE — Op Note (Signed)
[facility] Patient Name: Caitlin Rogers Procedure Date: 06/14/2021 7:54 AM MRN: [id number] Endoscopist: Ladene Artist , MD Age: 65 Referring MD:  Date of Birth: 02/27/1956 Gender: Female Account #: [id number] Procedure:                Colonoscopy Indications:              Surveillance: Personal history of adenomatous                            polyps and sessile serrated polyps, last                            colonoscopy 5 years ago Medicines:                Monitored Anesthesia Care Procedure:                Pre-Anesthesia Assessment:                           - Prior to the procedure, a History and Physical                            was performed, and patient medications and                            allergies were reviewed. The patient's tolerance of                            previous anesthesia was also reviewed. The risks                            and benefits of the procedure and the sedation                            options and risks were discussed with the patient.                            All questions were answered, and informed consent                            was obtained. Prior Anticoagulants: The patient has                            taken no previous anticoagulant or antiplatelet                            agents. ASA Grade Assessment: III - A patient with                            severe systemic disease. After reviewing the risks                            and benefits, the patient was deemed in  satisfactory condition to undergo the procedure.                           After obtaining informed consent, the colonoscope                            was passed under direct vision. Throughout the                            procedure, the patient's blood pressure, pulse, and                            oxygen saturations were monitored continuously. The                            Olympus CF-HQ190L [id number]) Colonoscope  was                            introduced through the anus and advanced to the the                            cecum, identified by appendiceal orifice and                            ileocecal valve. The ileocecal valve, appendiceal                            orifice, and rectum were photographed. The quality                            of the bowel preparation was good. The colonoscopy                            was performed without difficulty. The patient                            tolerated the procedure well. Scope In: 8:08:21 AM Scope Out: 8:21:49 AM Scope Withdrawal Time: 0 hours 10 minutes 33 seconds  Total Procedure Duration: 0 hours 13 minutes 28 seconds  Findings:                 The perianal and digital rectal examinations were                            normal.                           Two sessile polyps were found in the descending                            colon and transverse colon. The polyps were 7 mm in                            size. These polyps were removed with a cold snare.  Resection and retrieval were complete.                           Multiple medium-mouthed diverticula were found in                            the sigmoid colon, descending colon, transverse                            colon and hepatic flexure. There was no evidence of                            diverticular bleeding.                           Internal hemorrhoids were found during                            retroflexion. The hemorrhoids were small and Grade                            I (internal hemorrhoids that do not prolapse).                           The exam was otherwise without abnormality on                            direct and retroflexion views. Complications:            No immediate complications. Estimated blood loss:                            None. Estimated Blood Loss:     Estimated blood loss: none. Impression:               - Two 7 mm polyps in  the descending colon and in                            the transverse colon, removed with a cold snare.                            Resected and retrieved.                           - Moderate diverticulosis.                           - Internal hemorrhoids.                           - The examination was otherwise normal on direct                            and retroflexion views. Recommendation:           - Repeat colonoscopy after studies are complete for  surveillance based on pathology results.                           - Patient has a contact number available for                            emergencies. The signs and symptoms of potential                            delayed complications were discussed with the                            patient. Return to normal activities tomorrow.                            Written discharge instructions were provided to the                            patient.                           - High fiber diet.                           - Continue present medications.                           - Await pathology results. Ladene Artist, MD 06/14/2021 8:29:40 AM This report has been signed electronically.

## 2021-06-14 NOTE — Progress Notes (Signed)
Pt's states no medical or surgical changes since previsit or office visit. 

## 2021-06-14 NOTE — Progress Notes (Signed)
See 05/23/2021 office note, no changes.

## 2021-06-14 NOTE — Patient Instructions (Signed)
YOU HAD AN ENDOSCOPIC PROCEDURE TODAY AT [facility]:   Refer to the procedure report that was given to you for any specific questions about what was found during the examination.  If the procedure report does not answer your questions, please call your gastroenterologist to clarify.  If you requested that your care partner not be given the details of your procedure findings, then the procedure report has been included in a sealed envelope for you to review at your convenience later.  **Handouts given on polyps, diverticulosis, hemorrhoids and high fiber diet**  YOU SHOULD EXPECT: Some feelings of bloating in the abdomen. Passage of more gas than usual.  Walking can help get rid of the air that was put into your GI tract during the procedure and reduce the bloating. If you had a lower endoscopy (such as a colonoscopy or flexible sigmoidoscopy) you may notice spotting of blood in your stool or on the toilet paper. If you underwent a bowel prep for your procedure, you may not have a normal bowel movement for a few days.  Please Note:  You might notice some irritation and congestion in your nose or some drainage.  This is from the oxygen used during your procedure.  There is no need for concern and it should clear up in a day or so.  SYMPTOMS TO REPORT IMMEDIATELY:  Following lower endoscopy (colonoscopy or flexible sigmoidoscopy):  Excessive amounts of blood in the stool  Significant tenderness or worsening of abdominal pains  Swelling of the abdomen that is new, acute  Fever of 100F or higher  For urgent or emergent issues, a gastroenterologist can be reached at any hour by calling [phone]. Do not use MyChart messaging for urgent concerns.    DIET:  We do recommend a small meal at first, but then you may proceed to your regular diet.  Drink plenty of fluids but you should avoid alcoholic beverages for 24 hours.  ACTIVITY:  You should plan to take it easy for the rest of  today and you should NOT DRIVE or use heavy machinery until tomorrow (because of the sedation medicines used during the test).    FOLLOW UP: Our staff will call the number listed on your records 48-72 hours following your procedure to check on you and address any questions or concerns that you may have regarding the information given to you following your procedure. If we do not reach you, we will leave a message.  We will attempt to reach you two times.  During this call, we will ask if you have developed any symptoms of COVID 19. If you develop any symptoms (ie: fever, flu-like symptoms, shortness of breath, cough etc.) before then, please call [phone].  If you test positive for Covid 19 in the 2 weeks post procedure, please call and report this information to [place].    If any biopsies were taken you will be contacted by phone or by letter within the next 1-3 weeks.  Please call us at [phone] if you have not heard about the biopsies in 3 weeks.    SIGNATURES/CONFIDENTIALITY: You and/or your care partner have signed paperwork which will be entered into your electronic medical record.  These signatures attest to the fact that that the information above on your After Visit Summary has been reviewed and is understood.  Full responsibility of the confidentiality of this discharge information lies with you and/or your care-partner.

## 2021-06-14 NOTE — Progress Notes (Signed)
Called to room to assist during endoscopic procedure.  Patient ID and intended procedure confirmed with present staff. Received instructions for my participation in the procedure from the performing physician.  

## 2021-06-14 NOTE — Progress Notes (Signed)
Report given to PACU, vss 

## 2021-06-20 ENCOUNTER — Ambulatory Visit
Admission: RE | Admit: 2021-06-20 | Discharge: 2021-06-20 | Disposition: A | Discharge status: Home / Self Care | Payer: BC Managed Care – PPO | Source: Ambulatory Visit | Attending: Student | Admitting: Student

## 2021-06-20 ENCOUNTER — Telehealth: Payer: Self-pay

## 2021-06-20 ENCOUNTER — Other Ambulatory Visit: Payer: Self-pay

## 2021-06-20 DIAGNOSIS — S43015A Anterior dislocation of left humerus, initial encounter: Secondary | ICD-10-CM | POA: Diagnosis not present

## 2021-06-20 DIAGNOSIS — M7582 Other shoulder lesions, left shoulder: Secondary | ICD-10-CM | POA: Insufficient documentation

## 2021-06-20 DIAGNOSIS — M19011 Primary osteoarthritis, right shoulder: Secondary | ICD-10-CM | POA: Diagnosis not present

## 2021-06-20 DIAGNOSIS — M778 Other enthesopathies, not elsewhere classified: Secondary | ICD-10-CM | POA: Diagnosis not present

## 2021-06-20 DIAGNOSIS — M75102 Unspecified rotator cuff tear or rupture of left shoulder, not specified as traumatic: Secondary | ICD-10-CM | POA: Diagnosis not present

## 2021-06-20 DIAGNOSIS — X58XXXA Exposure to other specified factors, initial encounter: Secondary | ICD-10-CM | POA: Insufficient documentation

## 2021-06-20 MED ORDER — GADOBUTROL 1 MMOL/ML IV SOLN
0.0500 mL | Freq: Once | INTRAVENOUS | Status: AC | PRN
  Administered 2021-06-20: 10:00:00 0.05 mL

## 2021-06-20 MED ORDER — IOHEXOL 180 MG/ML  SOLN
10.0000 mL | Freq: Once | INTRAMUSCULAR | Status: AC | PRN
  Administered 2021-06-20: 10:00:00 10 mL

## 2021-06-20 MED ORDER — LIDOCAINE HCL (PF) 1 % IJ SOLN
5.0000 mL | Freq: Once | INTRAMUSCULAR | Status: AC
  Administered 2021-06-20: 10:00:00 5 mL
  Filled 2021-06-20: qty 5

## 2021-06-20 MED ORDER — SODIUM CHLORIDE (PF) 0.9 % IJ SOLN
10.0000 mL | Freq: Once | INTRAMUSCULAR | Status: AC
  Administered 2021-06-20: 10:00:00 10 mL via INTRAVENOUS

## 2021-06-20 NOTE — Telephone Encounter (Signed)
  Follow up Call-  Call back number 06/14/2021 09/27/2018  Post procedure Call Back phone  # [phone]  Permission to leave phone message Yes Yes  Some recent data might be hidden     Patient questions:  Do you have a fever, pain , or abdominal swelling? No. Pain Score  0 *  Have you tolerated food without any problems? Yes.    Have you been able to return to your normal activities? Yes.    Do you have any questions about your discharge instructions: Diet   No. Medications  No. Follow up visit  No.  Do you have questions or concerns about your Care? No.  Actions: * If pain score is 4 or above: No action needed, pain <4.

## 2021-06-22 ENCOUNTER — Other Ambulatory Visit: Payer: Self-pay | Admitting: Orthopedic Surgery

## 2021-06-22 DIAGNOSIS — S46012A Strain of muscle(s) and tendon(s) of the rotator cuff of left shoulder, initial encounter: Secondary | ICD-10-CM | POA: Diagnosis not present

## 2021-06-24 ENCOUNTER — Encounter: Payer: Self-pay | Admitting: Orthopedic Surgery

## 2021-06-27 ENCOUNTER — Encounter: Payer: Self-pay | Admitting: Gastroenterology

## 2021-07-01 ENCOUNTER — Encounter
Admission: RE | Disposition: A | Discharge status: Home / Self Care | Payer: Self-pay | Source: Home / Self Care | Attending: Orthopedic Surgery

## 2021-07-01 ENCOUNTER — Ambulatory Visit: Payer: BC Managed Care – PPO | Admitting: Anesthesiology

## 2021-07-01 ENCOUNTER — Other Ambulatory Visit: Payer: Self-pay

## 2021-07-01 ENCOUNTER — Ambulatory Visit
Admission: RE | Admit: 2021-07-01 | Discharge: 2021-07-01 | Disposition: A | Discharge status: Home / Self Care | Payer: BC Managed Care – PPO | Attending: Orthopedic Surgery | Admitting: Orthopedic Surgery

## 2021-07-01 ENCOUNTER — Encounter: Payer: Self-pay | Admitting: Orthopedic Surgery

## 2021-07-01 DIAGNOSIS — I10 Essential (primary) hypertension: Secondary | ICD-10-CM | POA: Diagnosis not present

## 2021-07-01 DIAGNOSIS — I2510 Atherosclerotic heart disease of native coronary artery without angina pectoris: Secondary | ICD-10-CM | POA: Diagnosis not present

## 2021-07-01 DIAGNOSIS — S46012A Strain of muscle(s) and tendon(s) of the rotator cuff of left shoulder, initial encounter: Secondary | ICD-10-CM | POA: Diagnosis not present

## 2021-07-01 DIAGNOSIS — M25512 Pain in left shoulder: Secondary | ICD-10-CM | POA: Diagnosis not present

## 2021-07-01 DIAGNOSIS — K219 Gastro-esophageal reflux disease without esophagitis: Secondary | ICD-10-CM | POA: Diagnosis not present

## 2021-07-01 DIAGNOSIS — M7522 Bicipital tendinitis, left shoulder: Secondary | ICD-10-CM | POA: Diagnosis not present

## 2021-07-01 DIAGNOSIS — M25812 Other specified joint disorders, left shoulder: Secondary | ICD-10-CM | POA: Diagnosis not present

## 2021-07-01 DIAGNOSIS — M75112 Incomplete rotator cuff tear or rupture of left shoulder, not specified as traumatic: Secondary | ICD-10-CM | POA: Diagnosis not present

## 2021-07-01 DIAGNOSIS — Z87891 Personal history of nicotine dependence: Secondary | ICD-10-CM | POA: Insufficient documentation

## 2021-07-01 DIAGNOSIS — M7582 Other shoulder lesions, left shoulder: Secondary | ICD-10-CM | POA: Diagnosis not present

## 2021-07-01 DIAGNOSIS — Z6829 Body mass index (BMI) 29.0-29.9, adult: Secondary | ICD-10-CM | POA: Insufficient documentation

## 2021-07-01 DIAGNOSIS — M75102 Unspecified rotator cuff tear or rupture of left shoulder, not specified as traumatic: Secondary | ICD-10-CM | POA: Insufficient documentation

## 2021-07-01 DIAGNOSIS — E669 Obesity, unspecified: Secondary | ICD-10-CM | POA: Diagnosis not present

## 2021-07-01 DIAGNOSIS — M7542 Impingement syndrome of left shoulder: Secondary | ICD-10-CM | POA: Diagnosis not present

## 2021-07-01 DIAGNOSIS — G8918 Other acute postprocedural pain: Secondary | ICD-10-CM | POA: Diagnosis not present

## 2021-07-01 DIAGNOSIS — M75122 Complete rotator cuff tear or rupture of left shoulder, not specified as traumatic: Secondary | ICD-10-CM | POA: Diagnosis not present

## 2021-07-01 HISTORY — PX: SHOULDER ARTHROSCOPY WITH ROTATOR CUFF REPAIR AND SUBACROMIAL DECOMPRESSION: SHX5686

## 2021-07-01 SURGERY — SHOULDER ARTHROSCOPY WITH ROTATOR CUFF REPAIR AND SUBACROMIAL DECOMPRESSION
Anesthesia: General | Site: Shoulder | Laterality: Left

## 2021-07-01 MED ORDER — FENTANYL CITRATE PF 50 MCG/ML IJ SOSY: 25.0000 ug | PREFILLED_SYRINGE | INTRAMUSCULAR | Status: DC | PRN

## 2021-07-01 MED ORDER — LACTATED RINGERS IR SOLN
Status: DC | PRN
  Administered 2021-07-01: 24000 mL

## 2021-07-01 MED ORDER — OXYCODONE HCL 5 MG PO TABS: 5.0000 mg | ORAL_TABLET | Freq: Once | ORAL | Status: DC | PRN

## 2021-07-01 MED ORDER — MIDAZOLAM HCL 5 MG/5ML IJ SOLN
INTRAMUSCULAR | Status: DC | PRN
  Administered 2021-07-01: 2 mg via INTRAVENOUS

## 2021-07-01 MED ORDER — ACETAMINOPHEN 160 MG/5ML PO SOLN: 325.0000 mg | ORAL | Status: DC | PRN

## 2021-07-01 MED ORDER — DEXAMETHASONE SODIUM PHOSPHATE 4 MG/ML IJ SOLN
INTRAMUSCULAR | Status: DC | PRN
  Administered 2021-07-01: 4 mg via INTRAVENOUS

## 2021-07-01 MED ORDER — ACETAMINOPHEN 500 MG PO TABS: 1000.0000 mg | ORAL_TABLET | Freq: Three times a day (TID) | ORAL | 2 refills | Status: DC

## 2021-07-01 MED ORDER — FENTANYL CITRATE (PF) 100 MCG/2ML IJ SOLN
INTRAMUSCULAR | Status: DC | PRN
  Administered 2021-07-01: 50 ug via INTRAVENOUS

## 2021-07-01 MED ORDER — OXYCODONE HCL 5 MG PO TABS
5.0000 mg | ORAL_TABLET | ORAL | 0 refills | Status: DC | PRN
Start: 2021-07-01 — End: 2021-10-14

## 2021-07-01 MED ORDER — ONDANSETRON HCL 4 MG/2ML IJ SOLN: 4.0000 mg | Freq: Once | INTRAMUSCULAR | Status: DC | PRN

## 2021-07-01 MED ORDER — LACTATED RINGERS IV SOLN: INTRAVENOUS | Status: DC

## 2021-07-01 MED ORDER — LIDOCAINE HCL (CARDIAC) PF 100 MG/5ML IV SOSY
PREFILLED_SYRINGE | INTRAVENOUS | Status: DC | PRN
  Administered 2021-07-01: 60 mg via INTRATRACHEAL

## 2021-07-01 MED ORDER — OXYCODONE HCL 5 MG/5ML PO SOLN: 5.0000 mg | Freq: Once | ORAL | Status: DC | PRN

## 2021-07-01 MED ORDER — PHENYLEPHRINE HCL (PRESSORS) 10 MG/ML IV SOLN
INTRAVENOUS | Status: DC | PRN
  Administered 2021-07-01 (×2): 100 ug via INTRAVENOUS
  Administered 2021-07-01: 200 ug via INTRAVENOUS
  Administered 2021-07-01 (×6): 100 ug via INTRAVENOUS
  Administered 2021-07-01: 200 ug via INTRAVENOUS
  Administered 2021-07-01 (×6): 100 ug via INTRAVENOUS

## 2021-07-01 MED ORDER — PROPOFOL 10 MG/ML IV BOLUS
INTRAVENOUS | Status: DC | PRN
  Administered 2021-07-01: 140 mg via INTRAVENOUS

## 2021-07-01 MED ORDER — BUPIVACAINE HCL (PF) 0.5 % IJ SOLN
INTRAMUSCULAR | Status: DC | PRN
  Administered 2021-07-01: 20 mL via PERINEURAL

## 2021-07-01 MED ORDER — ONDANSETRON 4 MG PO TBDP: 4.0000 mg | ORAL_TABLET | Freq: Three times a day (TID) | ORAL | 0 refills | Status: DC | PRN

## 2021-07-01 MED ORDER — ONDANSETRON HCL 4 MG/2ML IJ SOLN
INTRAMUSCULAR | Status: DC | PRN
  Administered 2021-07-01: 4 mg via INTRAVENOUS

## 2021-07-01 MED ORDER — ASPIRIN EC 325 MG PO TBEC: 325.0000 mg | DELAYED_RELEASE_TABLET | Freq: Every day | ORAL | 0 refills | Status: AC

## 2021-07-01 MED ORDER — BUPIVACAINE LIPOSOME 1.3 % IJ SUSP
INTRAMUSCULAR | Status: DC | PRN
  Administered 2021-07-01: 20 mL via PERINEURAL

## 2021-07-01 MED ORDER — GLYCOPYRROLATE 0.2 MG/ML IJ SOLN
INTRAMUSCULAR | Status: DC | PRN
  Administered 2021-07-01: .1 mg via INTRAVENOUS

## 2021-07-01 MED ORDER — ACETAMINOPHEN 325 MG PO TABS: 325.0000 mg | ORAL_TABLET | ORAL | Status: DC | PRN

## 2021-07-01 MED ORDER — CEFAZOLIN SODIUM-DEXTROSE 2-4 GM/100ML-% IV SOLN
2.0000 g | INTRAVENOUS | Status: AC
  Administered 2021-07-01: 2 g via INTRAVENOUS

## 2021-07-01 MED ORDER — LACTATED RINGERS IV SOLN
INTRAVENOUS | Status: DC | PRN
  Administered 2021-07-01: 12000 mL

## 2021-07-01 SURGICAL SUPPLY — 63 items
ADAPTER IRRIG TUBE 2 SPIKE SOL (ADAPTER) ×4 IMPLANT
ADH SKN CLS APL DERMABOND .7 (GAUZE/BANDAGES/DRESSINGS) ×1
ADPR TBG 2 SPK PMP STRL ASCP (ADAPTER) ×2
ANCH SUT 2 19.1 W/FIBERTAPE (Anchor) ×1 IMPLANT
ANCH SUT 2 SWLK 19.1 CLS EYLT (Anchor) ×3 IMPLANT
ANCHOR ICONIX SPEED 2.3 (Anchor) ×2 IMPLANT
ANCHOR SL BIO 4.75 W/FIBERTAPE (Anchor) ×2 IMPLANT
ANCHOR SWIVELOCK BIO 4.75X19.1 (Anchor) ×6 IMPLANT
APL PRP STRL LF DISP 70% ISPRP (MISCELLANEOUS) ×1
BLADE SHAVER 4.5X7 STR FR (MISCELLANEOUS) ×2 IMPLANT
BUR BR 5.5 WIDE MOUTH (BURR) ×2 IMPLANT
CANNULA PART THRD DISP 5.75X7 (CANNULA) ×2 IMPLANT
CANNULA PARTIAL THREAD 2X7 (CANNULA) ×2 IMPLANT
CANNULA TWIST IN 8.25X7CM (CANNULA) ×2 IMPLANT
CHLORAPREP W/TINT 26 (MISCELLANEOUS) ×2 IMPLANT
COOLER POLAR GLACIER W/PUMP (MISCELLANEOUS) ×2 IMPLANT
COVER LIGHT HANDLE UNIVERSAL (MISCELLANEOUS) ×4 IMPLANT
DERMABOND ADVANCED (GAUZE/BANDAGES/DRESSINGS) ×1
DERMABOND ADVANCED .7 DNX12 (GAUZE/BANDAGES/DRESSINGS) ×1 IMPLANT
DRAPE IMP U-DRAPE 54X76 (DRAPES) ×4 IMPLANT
DRAPE INCISE IOBAN 66X45 STRL (DRAPES) ×2 IMPLANT
DRAPE U-SHAPE 48X52 POLY STRL (PACKS) ×2 IMPLANT
DRSG TEGADERM 4X4.75 (GAUZE/BANDAGES/DRESSINGS) ×6 IMPLANT
ELECT REM PT RETURN 9FT ADLT (ELECTROSURGICAL) ×2
ELECTRODE REM PT RTRN 9FT ADLT (ELECTROSURGICAL) ×1 IMPLANT
GAUZE SPONGE 4X4 12PLY STRL (GAUZE/BANDAGES/DRESSINGS) ×2 IMPLANT
GAUZE XEROFORM 1X8 LF (GAUZE/BANDAGES/DRESSINGS) ×2 IMPLANT
GLOVE SRG 8 PF TXTR STRL LF DI (GLOVE) ×3 IMPLANT
GLOVE SURG ENC MOIS LTX SZ7.5 (GLOVE) ×10 IMPLANT
GLOVE SURG UNDER POLY LF SZ8 (GLOVE) ×6
GOWN STRL REIN 2XL XLG LVL4 (GOWN DISPOSABLE) ×2 IMPLANT
GOWN STRL REUS W/ TWL LRG LVL3 (GOWN DISPOSABLE) ×3 IMPLANT
GOWN STRL REUS W/TWL LRG LVL3 (GOWN DISPOSABLE) ×6
IV LACTATED RINGER IRRG 3000ML (IV SOLUTION) ×24
IV LR IRRIG 3000ML ARTHROMATIC (IV SOLUTION) ×12 IMPLANT
KIT STABILIZATION SHOULDER (MISCELLANEOUS) ×2 IMPLANT
KIT TURNOVER KIT A (KITS) ×2 IMPLANT
MANIFOLD 4PT FOR NEPTUNE1 (MISCELLANEOUS) ×2 IMPLANT
MASK FACE SPIDER DISP (MASK) ×2 IMPLANT
MAT ABSORB  FLUID 56X50 GRAY (MISCELLANEOUS) ×2
MAT ABSORB FLUID 56X50 GRAY (MISCELLANEOUS) ×2 IMPLANT
PACK ARTHROSCOPY SHOULDER (MISCELLANEOUS) ×2 IMPLANT
PAD WRAPON POLAR SHDR XLG (MISCELLANEOUS) ×1 IMPLANT
PASSER SUT FIRSTPASS SELF (INSTRUMENTS) ×2 IMPLANT
PENCIL SMOKE EVACUATOR (MISCELLANEOUS) ×2 IMPLANT
SPONGE GAUZE 2X2 8PLY STRL LF (GAUZE/BANDAGES/DRESSINGS) ×8 IMPLANT
SPONGE T-LAP 18X18 ~~LOC~~+RFID (SPONGE) ×2 IMPLANT
SUT ETHILON 3-0 (SUTURE) ×2 IMPLANT
SUT FIBERWIRE #2 38 T-5 BLUE (SUTURE) ×4
SUT MNCRL 4-0 (SUTURE) ×2
SUT MNCRL 4-0 27XMFL (SUTURE) ×1
SUT VIC AB 0 CT1 36 (SUTURE) ×2 IMPLANT
SUT VIC AB 2-0 CT2 27 (SUTURE) ×2 IMPLANT
SUTURE FIBERWR #2 38 T-5 BLUE (SUTURE) ×2 IMPLANT
SUTURE MNCRL 4-0 27XMF (SUTURE) ×1 IMPLANT
SUTURETAPE 1.3 40 W/NDL BLUE (SUTURE) ×6 IMPLANT
SYSTEM FBRTK BICEPS 1.9 DRILL (Anchor) ×2 IMPLANT
TAPE MICROFOAM 4IN (TAPE) ×2 IMPLANT
TUBING CONNECTING 10 (TUBING) ×2 IMPLANT
TUBING INFLOW SET DBFLO PUMP (TUBING) ×2 IMPLANT
TUBING OUTFLOW SET DBLFO PUMP (TUBING) ×2 IMPLANT
WAND WEREWOLF FLOW 90D (MISCELLANEOUS) ×2 IMPLANT
WRAPON POLAR PAD SHDR XLG (MISCELLANEOUS) ×2

## 2021-07-01 NOTE — Anesthesia Preprocedure Evaluation (Signed)
Anesthesia Evaluation  Patient identified by MRN, date of birth, ID band Patient awake    Reviewed: Allergy & Precautions, H&P , NPO status , Patient's Chart, lab work & pertinent test results  Airway Mallampati: II  TM Distance: >3 FB Neck ROM: full    Dental no notable dental hx.    Pulmonary former smoker,    Pulmonary exam normal breath sounds clear to auscultation       Cardiovascular hypertension, + CAD  Normal cardiovascular exam Rhythm:regular Rate:Normal     Neuro/Psych PSYCHIATRIC DISORDERS    GI/Hepatic GERD  ,  Endo/Other  obesity  Renal/GU Renal disease     Musculoskeletal   Abdominal   Peds  Hematology   Anesthesia Other Findings   Reproductive/Obstetrics                             Anesthesia Physical Anesthesia Plan  ASA: 3  Anesthesia Plan: General LMA   Post-op Pain Management: Regional block   Induction:   PONV Risk Score and Plan: 3 and Treatment may vary due to age or medical condition, Ondansetron, Dexamethasone and Midazolam  Airway Management Planned:   Additional Equipment:   Intra-op Plan:   Post-operative Plan:   Informed Consent: I have reviewed the patients History and Physical, chart, labs and discussed the procedure including the risks, benefits and alternatives for the proposed anesthesia with the patient or authorized representative who has indicated his/her understanding and acceptance.     Dental Advisory Given  Plan Discussed with: CRNA  Anesthesia Plan Comments:         Anesthesia Quick Evaluation

## 2021-07-01 NOTE — Anesthesia Postprocedure Evaluation (Signed)
Anesthesia Post Note  Patient: Caitlin Rogers  Procedure(s) Performed: Left shoulder arthroscopic subscapularis repair, mini-open supraspinatus repair, subacromial decompression, and bicepst tenodesis (Left: Shoulder)     Patient location during evaluation: PACU Anesthesia Type: General Level of consciousness: awake and alert and oriented Pain management: satisfactory to patient Vital Signs Assessment: post-procedure vital signs reviewed and stable Respiratory status: spontaneous breathing, nonlabored ventilation and respiratory function stable Cardiovascular status: blood pressure returned to baseline and stable Postop Assessment: Adequate PO intake and No signs of nausea or vomiting Anesthetic complications: no   No notable events documented.  Raliegh Ip

## 2021-07-01 NOTE — Anesthesia Procedure Notes (Signed)
Anesthesia Regional Block: Interscalene brachial plexus block   Pre-Anesthetic Checklist: , timeout performed,  Correct Patient, Correct Site, Correct Laterality,  Correct Procedure, Correct Position, site marked,  Risks and benefits discussed,  Surgical consent,  Pre-op evaluation,  At surgeon's request and post-op pain management  Laterality: Left  Prep: chloraprep       Needles:  Injection technique: Single-shot  Needle Type: Stimiplex     Needle Length: 10cm  Needle Gauge: 21     Additional Needles:   Procedures:,,,, ultrasound used (permanent image in chart),,    Narrative:  Start time: 07/01/2021 7:06 AM End time: 07/01/2021 7:12 AM Injection made incrementally with aspirations every 5 mL.  Performed by: Personally  Anesthesiologist: Ronelle Nigh, MD  Additional Notes: Functioning IV was confirmed and monitors applied. Ultrasound guidance: relevant anatomy identified, needle position confirmed, local anesthetic spread visualized around nerve(s)., vascular puncture avoided.  Image printed for medical record.  Negative aspiration and no paresthesias; incremental administration of local anesthetic. The patient tolerated the procedure well. Vitals signes recorded in RN notes.

## 2021-07-01 NOTE — H&P (Signed)
Paper H&P to be scanned into permanent record. H&P reviewed. No significant changes noted.  

## 2021-07-01 NOTE — Transfer of Care (Signed)
Immediate Anesthesia Transfer of Care Note  Patient: Caitlin Rogers  Procedure(s) Performed: Left shoulder arthroscopic subscapularis repair, mini-open supraspinatus repair, subacromial decompression, and bicepst tenodesis (Left: Shoulder)  Patient Location: PACU  Anesthesia Type: General LMA  Level of Consciousness: awake, alert  and patient cooperative  Airway and Oxygen Therapy: Patient Spontanous Breathing and Patient connected to supplemental oxygen  Post-op Assessment: Post-op Vital signs reviewed, Patient's Cardiovascular Status Stable, Respiratory Function Stable, Patent Airway and No signs of Nausea or vomiting  Post-op Vital Signs: Reviewed and stable  Complications: No notable events documented.

## 2021-07-01 NOTE — Op Note (Signed)
SURGERY DATE: 07/01/2021  PRE-OP DIAGNOSIS:  1. Left rotator cuff tear (subscapularis, supraspinatus, infraspinatus) after glenohumeral dislocation 2. Left subacromial impingement 3. Left biceps tendinopathy  POST-OP DIAGNOSIS: 1. Left rotator cuff tear (subscapularis, supraspinatus, infraspinatus) after glenohumeral dislocation 2. Left subacromial impingement 3. Left biceps tendinopathy  PROCEDURES:  1. Left arthroscopic rotator cuff repair (subscapularis) 2. Left mini-open rotator cuff repair (supraspinatus and infraspinatus) 3. Left open biceps tenodesis 4. Left arthroscopic extensive debridement of shoulder (glenohumeral and subacromial spaces) 5. Left arthroscopic subacromial decompression  SURGEON: Cato Mulligan, MD  ASSISTANT: Anitra Lauth, PA  ANESTHESIA: Gen with Exparil interscalene block  ESTIMATED BLOOD LOSS: 25cc  DRAINS:  none  TOTAL IV FLUIDS: per anesthesia   SPECIMENS: none  IMPLANTS:  - Arthrex 4.6mm SwiveLock x 4 - Arthrex FiberTak Suture Anchor - Double Loaded x1 - Iconix SPEED double loaded with 1.2 and 2.55mm tape x 1   OPERATIVE FINDINGS:  Examination under anesthesia: A careful examination under anesthesia was performed.  Passive range of motion was: FF: 150; ER at side: 60; ER in abduction: 90; IR in abduction: 50.  Anterior load shift: NT.  Posterior load shift: NT.  Sulcus in neutral: NT.  Sulcus in ER: NT.    Intra-operative findings: A thorough arthroscopic examination of the shoulder was performed.  The findings are: 1. Biceps tendon: complete medial subluxation with significant split thickness tearing 2. Superior labrum: injected with surrounding synovitis 3. Posterior labrum and capsule: normal 4. Inferior capsule and inferior recess: normal 5. Glenoid cartilage surface: Grade 1-2 degenerative changes 6. Supraspinatus attachment: full-thickness tear 7. Posterior rotator cuff attachment: Full-thickness tear of the anterior  infraspinatus 8. Humeral head articular cartilage: Grade 1-2 degenerative changes 9. Rotator interval: significant synovitis 10: Subscapularis tendon: Full-thickness tear with retraction to the glenohumeral joint 11. Anterior labrum: degenerative 12. IGHL: normal  OPERATIVE REPORT:   Indications for procedure: Caitlin Rogers is a 65 y.o. female who sustained a glenohumeral joint dislocation on 04/21/2019. Since that time, patient has had inability to lift arm overhead and significant pain with activity.  The patient elected to undergo trial of nonoperative management prior to getting an MRI.  This included medical management, activity modifications and physical therapy, but she did not have significant improvement in her symptoms. Clinical exam and MRI were suggestive of rotator cuff tear including subscapularis, supraspinatus, and infraspinatus tears; subacromial impingement; and biceps tendon pathology.  Given these findings, we decided to proceed with surgical management. After discussion of risks, benefits, and alternatives to surgery, the patient elected to proceed.   Procedure in detail:  I identified Caitlin Rogers in the pre-operative holding area.  I marked the operative shoulder with my initials. I reviewed the risks and benefits of the proposed surgical intervention, and the patient wished to proceed.  Anesthesia was then performed with an Exparel interscalene block.  The patient was transferred to the operative suite and placed in the beach chair position.    SCDs were placed on the lower extremities. Appropriate IV antibiotics were administered prior to incision. The operative upper extremity was then prepped and draped in standard fashion. A time out was performed confirming the correct extremity, correct patient, and correct procedure.   I then created a standard posterior portal with an 11 blade. The glenohumeral joint was easily entered with a blunt trochar and the arthroscope  introduced. The findings of diagnostic arthroscopy are described above.  A standard anterior portal was made.  I debrided degenerative tissue including the synovitic  tissue about the rotator interval and IGHL as well as the anterior and superior labrum. I then coagulated the inflamed synovium to obtain hemostasis and reduce the risk of post-operative swelling using an Arthrocare radiofrequency device. The biceps tendon was cut at its insertion on the superior labrum with arthroscopic scissors.  An ArthroCare wand and oscillating shaver were used to debride the biceps anchor complex down to the superior labrum.  The subscapularis was not readily identified.  Tear was identified.  A superior anterolateral portal was made under needle localization.  A 7 mm cannula was placed.  A grasper was used to pull tissue in the region of the subscapularis.  This allowed for appropriate identification of the common tissue, indicating the superolateral border of the subscapularis.  The tip of the coracoid as well as the conjoined tendon and coracoacromial ligaments were visualized after debriding rotator interval tissue.  Tissue about the subscapularis was released anteriorly, superiorly, and posteriorly to allow for improved mobilization.  The comma tissue was tagged with a FiberWire suture and tension on this allowed for appropriate mobilization of the subscapularis.  Due to the mildly limited mobility of the subscapularis, despite extensive release, the lesser tuberosity footprint was slightly medialized.  It was then prepared with a combination of electrocautery and an arthroscopic curette.  An Arthrex first pass suture passing device was used to pass 2 suture tapes (4 strands) in a mattress fashion through the torn portion of the subscapularis from the anterolateral portal.  The suture was then passed through an Arthrex 4.75 mm SwiveLock anchor.  This was impacted into the medialized lesser tuberosity footprint with the arm  in slight internal rotation, and the repair was tensioned appropriately. This appropriately reduced the subscapularis tear.  The arm was then internally and externally rotated and the subscapularis was noted to move appropriately with rotation.  The remainder of the suture was then cut.  Next, the arthroscope was then introduced into the subacromial space.  An extensive subacromial bursectomy was performed using a combination of the shaver and Arthrocare wand. The entire acromial undersurface was exposed and the CA ligament was subperiosteally elevated to expose the anterior acromial hook. A 5.73mm barrel burr was used to create a flat anterior and lateral aspect of the acromion, converting it from a Type 2 to a Type 1 acromion. Care was made to keep the deltoid fascia intact. The superior rotator cuff tear was visualized and decision was made to proceed with a mini-open approach.  A longitudinal incision from the anterolateral acromion ~5cm in length was made overlying the raphe between the anterior and middle heads of the deltoid.  This incision also incorporated the anterolateral portal.  The raphe was identified and it was incised. The subacromial space was identified. Any remaining bursa was excised. The rotator cuff tear involving the supraspinatus and part of the infraspinatus was identified. It was an L-shaped tear with the long limb of the L anterior.   We then turned our attention to the biceps tenodesis. The arm was externally rotated.  The bicipital groove was identified.  A 15 blade was used to make a cut overlying the bicipital groove, and the medially dislocated tendon was removed using a right angle clamp.  The base of the bicipital groove was identified and cleared of soft tissue.  A FiberTak anchor was placed in the bicipital groove.  The biceps tendon was held at the appropriate amount of tension.  One set of sutures was passed through the biceps anchor  with one limb passed in a simple  fashion and the second limb passed in a simple plus locking stitch pattern.  This was repeated for the other set of sutures.  This construct allowed for shuttling the biceps tendon down to the bone.  The sutures were tied and cut.  The diseased portion of the proximal biceps was then excised.  The arm was then internally rotated.  The rotator cuff footprint was cleared of soft tissue. The footprint was smoothed and a rongeur was used to create a smooth, bleeding bed.  An elevator was used to release the tissue both superior and inferior to the rotator cuff while the rotator cuff was held with a grasper.  To reduce tension on the eventual repair, the footprint was slightly medialized using a rongeur.  An Iconix SPEED anchor was placed just lateral to the articular margin anteriorly.  An Arthrex 4.75 mm SwiveLock anchor with fiber tape and suture tape was placed at the posterior aspect of the articular margin.  The rotator cuff was held in a reduced position and all limbs of suture were passed appropriately with a first pass suture passer. Two SwiveLock anchors were placed for the lateral row anchors with limbs of the four medial row sutures passed these anchors.  This allowed for appropriate reapproximation to the native footprint with excellent compression.  The construct was stable with external and internal rotation.  The wound was thoroughly irrigated.  The deltoid split was closed with 0 Vicryl.  The subdermal layer was closed with 2-0 Vicryl.  The skin was closed with 4-0 Monocryl and Dermabond. The portals were closed with 3-0 Nylon. Xeroform was applied to the incisions. A sterile dressing was applied, followed by a Polar Care sleeve and a SlingShot shoulder immobilizer/sling. The patient was awakened from anesthesia without difficulty and was transferred to the PACU in stable condition.   Of note, assistance from a PA was essential to performing the surgery.  PA was present for the entire surgery.  PA  assisted with patient positioning, retraction, instrumentation, and wound closure. The surgery would have been more difficult and had longer operative time without PA assistance.    COMPLICATIONS: none  DISPOSITION: plan for discharge home after recovery in PACU   POSTOPERATIVE PLAN: Remain in sling (except hygiene and elbow/wrist/hand RoM exercises as instructed by PT) x 6 weeks and NWB for this time. PT to begin 3-4 days after surgery.  Use large rotator cuff repair rehab protocol with subscapularis repair.  ASA 325mg  daily x 2 weeks for DVT ppx.

## 2021-07-01 NOTE — Anesthesia Procedure Notes (Signed)
Procedure Name: LMA Insertion Date/Time: 07/01/2021 7:44 AM Performed by: Silvana Newness, CRNA Pre-anesthesia Checklist: Patient identified, Emergency Drugs available, Suction available, Patient being monitored and Timeout performed Patient Re-evaluated:Patient Re-evaluated prior to induction Oxygen Delivery Method: Circle system utilized Preoxygenation: Pre-oxygenation with 100% oxygen Induction Type: IV induction Ventilation: Mask ventilation without difficulty LMA: LMA inserted LMA Size: 4.0 Number of attempts: 1 Placement Confirmation: positive ETCO2 and breath sounds checked- equal and bilateral Tube secured with: Tape Dental Injury: Teeth and Oropharynx as per pre-operative assessment

## 2021-07-01 NOTE — Discharge Instructions (Addendum)
Post-Op Instructions - Rotator Cuff Repair  1. Bracing: You will wear a shoulder immobilizer or sling for 6 weeks.   2. Driving: No driving for 3 weeks post-op. When driving, do not wear the immobilizer. Ideally, we recommend no driving for 6 weeks while sling is in place as one arm will be immobilized.   3. Activity: No active lifting for 2 months. Wrist, hand, and elbow motion only. Avoid lifting the upper arm away from the body except for hygiene. You are permitted to bend and straighten the elbow passively only (no active elbow motion). You may use your hand and wrist for typing, writing, and managing utensils (cutting food). Do not lift more than a coffee cup for 8 weeks.  When sleeping or resting, inclined positions (recliner chair or wedge pillow) and a pillow under the forearm for support may provide better comfort for up to 4 weeks.  Avoid long distance travel for 4 weeks.  Return to normal activities after rotator cuff repair repair normally takes 6 months on average. If rehab goes very well, may be able to do most activities at 4 months, except overhead or contact sports.  4. Physical Therapy: Begins 3-4 days after surgery, and proceed 1 time per week for the first 6 weeks, then 1-2 times per week from weeks 6-20 post-op.  5. Medications:  - You will be provided a prescription for narcotic pain medicine. After surgery, take 1-2 narcotic tablets every 4 hours if needed for severe pain.  - A prescription for anti-nausea medication will be provided in case the narcotic medicine causes nausea - take 1 tablet every 6 hours only if nauseated.   - Take tylenol 1000 mg (2 Extra Strength tablets or 3 regular strength) every 8 hours for pain.  May decrease or stop tylenol 5 days after surgery if you are having minimal pain. - Take ASA 325mg/day x 2 weeks to help prevent DVTs/PEs (blood clots).  - DO NOT take ANY nonsteroidal anti-inflammatory pain medications (Advil, Motrin, Ibuprofen, Aleve,  Naproxen, or Naprosyn). These medicines can inhibit healing of your shoulder repair.    If you are taking prescription medication for anxiety, depression, insomnia, muscle spasm, chronic pain, or for attention deficit disorder, you are advised that you are at a higher risk of adverse effects with use of narcotics post-op, including narcotic addiction/dependence, depressed breathing, death. If you use non-prescribed substances: alcohol, marijuana, cocaine, heroin, methamphetamines, etc., you are at a higher risk of adverse effects with use of narcotics post-op, including narcotic addiction/dependence, depressed breathing, death. You are advised that taking > 50 morphine milligram equivalents (MME) of narcotic pain medication per day results in twice the risk of overdose or death. For your prescription provided: oxycodone 5 mg - taking more than 6 tablets per day would result in > 50 morphine milligram equivalents (MME) of narcotic pain medication. Be advised that we will prescribe narcotics short-term, for acute post-operative pain only - 3 weeks for major operations such as shoulder repair/reconstruction surgeries.     6. Post-Op Appointment:  Your first post-op appointment will be 10-14 days post-op.  7. Work or School: For most, but not all procedures, we advise staying out of work or school for at least 1 to 2 weeks in order to recover from the stress of surgery and to allow time for healing.   If you need a work or school note this can be provided.   8. Smoking: If you are a smoker, you need to refrain from   smoking in the postoperative period. The nicotine in cigarettes will inhibit healing of your shoulder repair and decrease the chance of successful repair. Similarly, nicotine containing products (gum, patches) should be avoided.   Post-operative Brace: Apply and remove the brace you received as you were instructed to at the time of fitting and as described in detail as the brace's  instructions for use indicate.  Wear the brace for the period of time prescribed by your physician.  The brace can be cleaned with soap and water and allowed to air dry only.  Should the brace result in increased pain, decreased feeling (numbness/tingling), increased swelling or an overall worsening of your medical condition, please contact your doctor immediately.  If an emergency situation occurs as a result of wearing the brace after normal business hours, please dial 911 and seek immediate medical attention.  Let your doctor know if you have any further questions about the brace issued to you. Refer to the shoulder sling instructions for use if you have any questions regarding the correct fit of your shoulder sling.  [place] [facility]oubles[facility]ooting: [phone]  Video that illustrates how to properly use a shoulder sling: "Instructions for Proper Use of an Orthopaedic Sling" [email or web address]        Information for Discharge Teaching: EXPAREL (bupivacaine liposome injectable suspension)   Your surgeon or anesthesiologist gave you EXPAREL(bupivacaine) to help control your pain after surgery.  EXPAREL is a local anesthetic that provides pain relief by numbing the tissue around the surgical site. EXPAREL is designed to release pain medication over time and can control pain for up to 72 hours. Depending on how you respond to EXPAREL, you may require less pain medication during your recovery.  Possible side effects: Temporary loss of sensation or ability to move in the area where bupivacaine was injected. Nausea, vomiting, constipation Rarely, numbness and tingling in your mouth or lips, lightheadedness, or anxiety may occur. Call your doctor right away if you think you may be experiencing any of these sensations, or if you have other questions regarding possible side effects.  Follow all other discharge instructions given to you by your surgeon or nurse.  Eat a healthy diet and drink plenty of water or other fluids.  If you return to the hospital for any reason within 96 hours following the administration of EXPAREL, it is important for health care providers to know that you have received this anesthetic. A teal colored band has been placed on your arm with the date, time and amount of EXPAREL you have received in order to alert and inform your health care providers. Please leave this armband in place for the full 96 hours following administration, and then you may remove the band.   PERIPHERAL NERVE BLOCK PATIENT INFORMATION  Your surgeon has requested a peripheral nerve block for your surgery. This anesthetic technique provides excellent post-operative pain relief for you in a safe and effective manner. It will also help reduce the risk of nausea and vomiting and allow earlier discharge from the hospital.   The block is performed under sedation with ultrasound guidance prior to your procedure. Due to the sedation, your may or may not remember the block experience. The nerve block will begin to take effect anywhere from 5 to 30 minutes after being administered. You will be transported to the operating room from your surgery after the block is completed.   At the end of surgery, when the anesthesia wears off, you will notice a few things.  Your may not be able to move or feel the part of your body targeted by the nerve block. These are normal experiences, and they will disappear as the block wears off.  If you had an interscalene nerve block performed (which is common for shoulder surgery), your voice can be very hoarse and you may feel that you are not able to take as deep a breath as you did before surgery. Some patients may also notice a droopy eyelid on the affected side. These symptoms will resolve once the block wears off.  Pain control: The nerve block technique used is a single injection that can last anywhere from 1-3 days. The duration of the  numbness can vary between individuals. After leaving the hospital, it is important that you begin to take your prescribed pain medication when you start to sense the nerve block wearing off. This will help you avoid unpleasant pain at the time the nerve block wears off, which can sometimes be in the middle of the night. The block will only cover pain in the areas targeted by the nerve block so if you experience surgical pain outside of that area, please take your prescribed pain medication. Management of the "numb area": After a nerve block, you cannot feel pain, pressure, or temperature in the affected area so there is an increased risk for injury. You should take extra care to protect the affected areas until sensation and movement returns. Please take caution to not come in contact with extremely hot or cold items because you will not be able to sense or protect yourself form the extremes of temperature.  You may experience some persistent numbness after the procedure by most neurological deficits resolve over time and the incidence of serious long term neurological complications attributable to peripheral nerve blocks are relatively uncommon.

## 2021-07-04 ENCOUNTER — Encounter: Payer: Self-pay | Admitting: Orthopedic Surgery

## 2021-07-05 DIAGNOSIS — M25512 Pain in left shoulder: Secondary | ICD-10-CM | POA: Diagnosis not present

## 2021-07-05 DIAGNOSIS — G8929 Other chronic pain: Secondary | ICD-10-CM | POA: Diagnosis not present

## 2021-07-10 ENCOUNTER — Other Ambulatory Visit: Payer: Self-pay | Admitting: Gastroenterology

## 2021-07-10 ENCOUNTER — Other Ambulatory Visit: Payer: Self-pay | Admitting: Primary Care

## 2021-07-10 ENCOUNTER — Other Ambulatory Visit: Payer: Self-pay | Admitting: Cardiology

## 2021-07-10 DIAGNOSIS — Z800 Family history of malignant neoplasm of digestive organs: Secondary | ICD-10-CM

## 2021-07-10 DIAGNOSIS — Z86010 Personal history of colonic polyps: Secondary | ICD-10-CM

## 2021-07-13 DIAGNOSIS — M25512 Pain in left shoulder: Secondary | ICD-10-CM | POA: Diagnosis not present

## 2021-07-13 DIAGNOSIS — G8929 Other chronic pain: Secondary | ICD-10-CM | POA: Diagnosis not present

## 2021-07-21 DIAGNOSIS — M25512 Pain in left shoulder: Secondary | ICD-10-CM | POA: Diagnosis not present

## 2021-07-21 DIAGNOSIS — G8929 Other chronic pain: Secondary | ICD-10-CM | POA: Diagnosis not present

## 2021-07-24 HISTORY — PX: EYE SURGERY: SHX253

## 2021-07-27 ENCOUNTER — Telehealth: Payer: Self-pay | Admitting: Cardiovascular Disease

## 2021-07-27 DIAGNOSIS — G8929 Other chronic pain: Secondary | ICD-10-CM | POA: Diagnosis not present

## 2021-07-27 DIAGNOSIS — M25512 Pain in left shoulder: Secondary | ICD-10-CM | POA: Diagnosis not present

## 2021-07-27 NOTE — Telephone Encounter (Signed)
New Message:    Patient wants to know if she will need lab work before her bext appointment? She postponed her appointment until June,

## 2021-07-28 NOTE — Telephone Encounter (Signed)
Will route to Dr. to review if labs are needed.  Thank you!

## 2021-07-29 ENCOUNTER — Ambulatory Visit (HOSPITAL_BASED_OUTPATIENT_CLINIC_OR_DEPARTMENT_OTHER): Payer: BC Managed Care – PPO | Admitting: Cardiovascular Disease

## 2021-07-29 NOTE — Telephone Encounter (Signed)
Spoke with patient and she will just call back after she has labs at her PCP

## 2021-07-30 ENCOUNTER — Other Ambulatory Visit: Payer: Self-pay | Admitting: Family Medicine

## 2021-08-01 NOTE — Telephone Encounter (Signed)
Last refill: 06/10/21 #30 Last OV: 05/10/21 Hip pain, depression/anxiety No future appts scheduled

## 2021-08-03 DIAGNOSIS — M25512 Pain in left shoulder: Secondary | ICD-10-CM | POA: Diagnosis not present

## 2021-08-03 DIAGNOSIS — G8929 Other chronic pain: Secondary | ICD-10-CM | POA: Diagnosis not present

## 2021-08-10 DIAGNOSIS — G8929 Other chronic pain: Secondary | ICD-10-CM | POA: Diagnosis not present

## 2021-08-10 DIAGNOSIS — M25512 Pain in left shoulder: Secondary | ICD-10-CM | POA: Diagnosis not present

## 2021-08-16 DIAGNOSIS — M25512 Pain in left shoulder: Secondary | ICD-10-CM | POA: Diagnosis not present

## 2021-08-16 DIAGNOSIS — G8929 Other chronic pain: Secondary | ICD-10-CM | POA: Diagnosis not present

## 2021-08-17 ENCOUNTER — Other Ambulatory Visit: Payer: Self-pay | Admitting: Family Medicine

## 2021-08-17 DIAGNOSIS — F419 Anxiety disorder, unspecified: Secondary | ICD-10-CM

## 2021-08-17 DIAGNOSIS — F321 Major depressive disorder, single episode, moderate: Secondary | ICD-10-CM

## 2021-08-17 NOTE — Telephone Encounter (Signed)
Mychart sent to pt asking what dose she is taking

## 2021-08-19 DIAGNOSIS — M25512 Pain in left shoulder: Secondary | ICD-10-CM | POA: Diagnosis not present

## 2021-08-19 DIAGNOSIS — G8929 Other chronic pain: Secondary | ICD-10-CM | POA: Diagnosis not present

## 2021-08-23 DIAGNOSIS — G8929 Other chronic pain: Secondary | ICD-10-CM | POA: Diagnosis not present

## 2021-08-23 DIAGNOSIS — M25512 Pain in left shoulder: Secondary | ICD-10-CM | POA: Diagnosis not present

## 2021-08-26 DIAGNOSIS — G8929 Other chronic pain: Secondary | ICD-10-CM | POA: Diagnosis not present

## 2021-08-26 DIAGNOSIS — M25512 Pain in left shoulder: Secondary | ICD-10-CM | POA: Diagnosis not present

## 2021-08-30 DIAGNOSIS — M25512 Pain in left shoulder: Secondary | ICD-10-CM | POA: Diagnosis not present

## 2021-08-30 DIAGNOSIS — G8929 Other chronic pain: Secondary | ICD-10-CM | POA: Diagnosis not present

## 2021-09-06 DIAGNOSIS — M25512 Pain in left shoulder: Secondary | ICD-10-CM | POA: Diagnosis not present

## 2021-09-06 DIAGNOSIS — G8929 Other chronic pain: Secondary | ICD-10-CM | POA: Diagnosis not present

## 2021-09-09 DIAGNOSIS — G8929 Other chronic pain: Secondary | ICD-10-CM | POA: Diagnosis not present

## 2021-09-09 DIAGNOSIS — M25512 Pain in left shoulder: Secondary | ICD-10-CM | POA: Diagnosis not present

## 2021-09-13 DIAGNOSIS — G8929 Other chronic pain: Secondary | ICD-10-CM | POA: Diagnosis not present

## 2021-09-13 DIAGNOSIS — M25512 Pain in left shoulder: Secondary | ICD-10-CM | POA: Diagnosis not present

## 2021-09-14 ENCOUNTER — Other Ambulatory Visit: Payer: Self-pay | Admitting: Family Medicine

## 2021-09-14 NOTE — Telephone Encounter (Signed)
BP Readings from Last 3 Encounters:  07/01/21 (!) 133/58  06/14/21 131/70  05/23/21 (!) 150/72   Refill provided

## 2021-09-20 DIAGNOSIS — G8929 Other chronic pain: Secondary | ICD-10-CM | POA: Diagnosis not present

## 2021-09-20 DIAGNOSIS — M25512 Pain in left shoulder: Secondary | ICD-10-CM | POA: Diagnosis not present

## 2021-09-23 DIAGNOSIS — G8929 Other chronic pain: Secondary | ICD-10-CM | POA: Diagnosis not present

## 2021-09-23 DIAGNOSIS — M25512 Pain in left shoulder: Secondary | ICD-10-CM | POA: Diagnosis not present

## 2021-09-29 ENCOUNTER — Other Ambulatory Visit: Payer: Self-pay | Admitting: Orthopedic Surgery

## 2021-09-29 DIAGNOSIS — M75122 Complete rotator cuff tear or rupture of left shoulder, not specified as traumatic: Secondary | ICD-10-CM

## 2021-10-07 ENCOUNTER — Ambulatory Visit
Admission: RE | Admit: 2021-10-07 | Discharge: 2021-10-07 | Disposition: A | Discharge status: Home / Self Care | Payer: BC Managed Care – PPO | Source: Ambulatory Visit | Attending: Orthopedic Surgery | Admitting: Orthopedic Surgery

## 2021-10-07 ENCOUNTER — Other Ambulatory Visit: Payer: Self-pay

## 2021-10-07 DIAGNOSIS — S46012A Strain of muscle(s) and tendon(s) of the rotator cuff of left shoulder, initial encounter: Secondary | ICD-10-CM | POA: Diagnosis not present

## 2021-10-07 DIAGNOSIS — M75122 Complete rotator cuff tear or rupture of left shoulder, not specified as traumatic: Secondary | ICD-10-CM | POA: Insufficient documentation

## 2021-10-08 ENCOUNTER — Other Ambulatory Visit: Payer: Self-pay | Admitting: Family Medicine

## 2021-10-08 ENCOUNTER — Other Ambulatory Visit: Payer: Self-pay | Admitting: Gastroenterology

## 2021-10-09 ENCOUNTER — Other Ambulatory Visit: Payer: Self-pay | Admitting: Primary Care

## 2021-10-09 ENCOUNTER — Other Ambulatory Visit: Payer: Self-pay | Admitting: Family Medicine

## 2021-10-10 ENCOUNTER — Other Ambulatory Visit: Payer: Self-pay | Admitting: Orthopedic Surgery

## 2021-10-10 DIAGNOSIS — M75122 Complete rotator cuff tear or rupture of left shoulder, not specified as traumatic: Secondary | ICD-10-CM

## 2021-10-10 NOTE — Telephone Encounter (Signed)
Needs appointment for xanax refill ?

## 2021-10-11 ENCOUNTER — Other Ambulatory Visit: Payer: Self-pay | Admitting: Orthopedic Surgery

## 2021-10-11 ENCOUNTER — Ambulatory Visit
Admission: RE | Admit: 2021-10-11 | Discharge: 2021-10-11 | Disposition: A | Discharge status: Home / Self Care | Payer: BC Managed Care – PPO | Source: Ambulatory Visit | Attending: Orthopedic Surgery | Admitting: Orthopedic Surgery

## 2021-10-11 ENCOUNTER — Other Ambulatory Visit: Payer: Self-pay

## 2021-10-11 DIAGNOSIS — M75122 Complete rotator cuff tear or rupture of left shoulder, not specified as traumatic: Secondary | ICD-10-CM | POA: Diagnosis not present

## 2021-10-11 DIAGNOSIS — S43082A Other subluxation of left shoulder joint, initial encounter: Secondary | ICD-10-CM | POA: Diagnosis not present

## 2021-10-11 DIAGNOSIS — S43012A Anterior subluxation of left humerus, initial encounter: Secondary | ICD-10-CM | POA: Diagnosis not present

## 2021-10-11 DIAGNOSIS — M19012 Primary osteoarthritis, left shoulder: Secondary | ICD-10-CM | POA: Diagnosis not present

## 2021-10-14 ENCOUNTER — Other Ambulatory Visit: Payer: Self-pay

## 2021-10-14 ENCOUNTER — Other Ambulatory Visit: Payer: Self-pay | Admitting: Gastroenterology

## 2021-10-14 ENCOUNTER — Other Ambulatory Visit: Payer: Self-pay | Admitting: Family Medicine

## 2021-10-14 ENCOUNTER — Encounter
Admission: RE | Admit: 2021-10-14 | Discharge: 2021-10-14 | Disposition: A | Discharge status: Home / Self Care | Payer: BC Managed Care – PPO | Source: Ambulatory Visit | Attending: Orthopedic Surgery | Admitting: Orthopedic Surgery

## 2021-10-14 VITALS — BP 154/77 | HR 82 | Resp 14 | Ht 63.0 in | Wt 165.0 lb

## 2021-10-14 DIAGNOSIS — Z01810 Encounter for preprocedural cardiovascular examination: Secondary | ICD-10-CM | POA: Diagnosis not present

## 2021-10-14 DIAGNOSIS — Z01812 Encounter for preprocedural laboratory examination: Secondary | ICD-10-CM | POA: Diagnosis not present

## 2021-10-14 LAB — SURGICAL PCR SCREEN
MRSA, PCR: NEGATIVE
Staphylococcus aureus: NEGATIVE

## 2021-10-14 LAB — URINALYSIS, ROUTINE W REFLEX MICROSCOPIC
Bacteria, UA: NONE SEEN
Bilirubin Urine: NEGATIVE
Glucose, UA: NEGATIVE mg/dL
Hgb urine dipstick: NEGATIVE
Ketones, ur: NEGATIVE mg/dL
Leukocytes,Ua: NEGATIVE
Nitrite: NEGATIVE
Protein, ur: NEGATIVE mg/dL
Specific Gravity, Urine: 1.003 — ABNORMAL LOW (ref 1.005–1.030)
Squamous Epithelial / HPF: NONE SEEN (ref 0–5)
pH: 6 (ref 5.0–8.0)

## 2021-10-14 LAB — TYPE AND SCREEN
ABO/RH(D): A POS
Antibody Screen: NEGATIVE

## 2021-10-14 LAB — CBC WITH DIFFERENTIAL/PLATELET
Abs Immature Granulocytes: 0.02 10*3/uL (ref 0.00–0.07)
Basophils Absolute: 0 10*3/uL (ref 0.0–0.1)
Basophils Relative: 1 %
Eosinophils Absolute: 0.1 10*3/uL (ref 0.0–0.5)
Eosinophils Relative: 2 %
HCT: 35.6 % — ABNORMAL LOW (ref 36.0–46.0)
Hemoglobin: 11.9 g/dL — ABNORMAL LOW (ref 12.0–15.0)
Immature Granulocytes: 0 %
Lymphocytes Relative: 26 %
Lymphs Abs: 1.5 10*3/uL (ref 0.7–4.0)
MCH: 30.1 pg (ref 26.0–34.0)
MCHC: 33.4 g/dL (ref 30.0–36.0)
MCV: 90.1 fL (ref 80.0–100.0)
Monocytes Absolute: 0.4 10*3/uL (ref 0.1–1.0)
Monocytes Relative: 7 %
Neutro Abs: 3.9 10*3/uL (ref 1.7–7.7)
Neutrophils Relative %: 64 %
Platelets: 194 10*3/uL (ref 150–400)
RBC: 3.95 MIL/uL (ref 3.87–5.11)
RDW: 13 % (ref 11.5–15.5)
WBC: 6 10*3/uL (ref 4.0–10.5)
nRBC: 0 % (ref 0.0–0.2)

## 2021-10-14 LAB — COMPREHENSIVE METABOLIC PANEL
ALT: 23 U/L (ref 0–44)
AST: 44 U/L — ABNORMAL HIGH (ref 15–41)
Albumin: 4.2 g/dL (ref 3.5–5.0)
Alkaline Phosphatase: 81 U/L (ref 38–126)
Anion gap: 10 (ref 5–15)
BUN: 20 mg/dL (ref 8–23)
CO2: 24 mmol/L (ref 22–32)
Calcium: 9.5 mg/dL (ref 8.9–10.3)
Chloride: 99 mmol/L (ref 98–111)
Creatinine, Ser: 0.92 mg/dL (ref 0.44–1.00)
GFR, Estimated: >60 mL/min (ref >60)
Glucose, Bld: 94 mg/dL (ref 70–99)
Potassium: 3.9 mmol/L (ref 3.5–5.1)
Sodium: 133 mmol/L — ABNORMAL LOW (ref 135–145)
Total Bilirubin: 0.8 mg/dL (ref 0.3–1.2)
Total Protein: 8.3 g/dL — ABNORMAL HIGH (ref 6.5–8.1)

## 2021-10-14 NOTE — Patient Instructions (Addendum)
Your procedure is scheduled on: 10/28/21 Report to [place]. To find out your arrival time please call [phone] between 1PM - 3PM on 10/27/21.  Remember: Instructions that are not followed completely may result in serious medical risk, up to and including death, or upon the discretion of your surgeon and anesthesiologist your surgery may need to be rescheduled.     _X__ 1. Do not eat food after midnight the night before your procedure.                 No gum chewing or hard candies. You may drink clear liquids up to 2 hours                 before you are scheduled to arrive for your surgery- DO not drink clear                 liquids within 2 hours of the start of your surgery.                 Clear Liquids include:  water, apple juice without pulp, clear carbohydrate                 drink such as Clearfast or Gatorade, Black Coffee or Tea (Do not add                 anything to coffee or tea). Diabetics water only  DRINK THE ENSURE "CLEAR" PRE SURGERY DRINK 2 HOURS PRIOR TO ARRIVAL THE DAY OF SURGERY  __X__2.  On the morning of surgery brush your teeth with toothpaste and water, you                 may rinse your mouth with mouthwash if you wish.  Do not swallow any              toothpaste of mouthwash.     _X__ 3.  No Alcohol for 24 hours before or after surgery.   _X__ 4.  Do Not Smoke or use e-cigarettes For 24 Hours Prior to Your Surgery.                 Do not use any chewable tobacco products for at least 6 hours prior to                 surgery.  ____  5.  Bring all medications with you on the day of surgery if instructed.   __X__  6.  Notify your doctor if there is any change in your medical condition      (cold, fever, infections).     Do not wear jewelry, make-up, hairpins, clips or nail polish. Do not wear lotions, powders, or perfumes. NO DEODORANT Do not shave BODY HAIR 48 hours prior to surgery. Men may shave  face and neck. Do not bring valuables to the hospital.    [facility] is not responsible for any belongings or valuables.  Contacts, dentures/partials or body piercings may not be worn into surgery. Bring a case for your contacts, glasses or hearing aids, a denture cup will be supplied. Leave your suitcase in the car. After surgery it may be brought to your room. For patients admitted to the hospital, discharge time is determined by your treatment team.   Patients discharged the day of surgery will not be allowed to drive home.   Please read over the following fact sheets that you were given:  CHG soap, Ensure, Incentive spirometer, Benzoyl peroxide  __X__ Take these medicines the morning of surgery with A SIP OF WATER:    1. metoprolol succinate (TOPROL-XL) 25 MG 24 hr tablet  2. omeprazole (PRILOSEC) 40 MG capsule  3. rosuvastatin (CRESTOR) 20 MG tablet  4. ALPRAZolam (XANAX) 0.5 MG tablet  5.  6.  ____ Fleet Enema (as directed)   __X__ Use CHG Soap/SAGE wipes as directed  ____ Use inhalers on the day of surgery  ____ Stop metformin/Janumet/Farxiga 2 days prior to surgery    ____ Take 1/2 of usual insulin dose the night before surgery. No insulin the morning          of surgery.   ____ Stop Blood Thinners Coumadin/Plavix/Xarelto/Pleta/Pradaxa/Eliquis/Effient/Aspirin  on   Or contact your Surgeon, Cardiologist or Medical Doctor regarding  ability to stop your blood thinners  __X__ Stop Anti-inflammatories 7 days before surgery such as Advil, Ibuprofen, Motrin,  BC or Goodies Powder, Naprosyn, Naproxen, Aleve, Aspirin   MAY USE TYLENOL  __X__ Stop all herbals and supplements, fish oil or vitamins  until after surgery.    ____ Bring C-Pap to the hospital.

## 2021-10-20 ENCOUNTER — Other Ambulatory Visit: Payer: BC Managed Care – PPO

## 2021-10-20 ENCOUNTER — Telehealth: Payer: Self-pay | Admitting: Family Medicine

## 2021-10-20 NOTE — Telephone Encounter (Signed)
Pt returning your call

## 2021-10-20 NOTE — Telephone Encounter (Signed)
LMTCB to schedule appt with Dr. Einar Pheasant ?

## 2021-10-21 ENCOUNTER — Other Ambulatory Visit: Payer: Self-pay | Admitting: Gastroenterology

## 2021-10-21 ENCOUNTER — Other Ambulatory Visit: Payer: Self-pay | Admitting: Family Medicine

## 2021-10-21 NOTE — Telephone Encounter (Signed)
Patient called back she is schedule to have surgery on her shoulder on next Friday she wanted to know if she could get refill and schedule follow up for after fist of May. I had advised that she come in before that date. She agreed and I was able to get scheduled for 10/26/21  ?

## 2021-10-26 ENCOUNTER — Ambulatory Visit (INDEPENDENT_AMBULATORY_CARE_PROVIDER_SITE_OTHER): Payer: BC Managed Care – PPO | Admitting: Family Medicine

## 2021-10-26 VITALS — BP 100/60 | HR 88 | Temp 98.0°F | Ht 63.0 in | Wt 168.2 lb

## 2021-10-26 DIAGNOSIS — F419 Anxiety disorder, unspecified: Secondary | ICD-10-CM

## 2021-10-26 DIAGNOSIS — F325 Major depressive disorder, single episode, in full remission: Secondary | ICD-10-CM

## 2021-10-26 DIAGNOSIS — I700 Atherosclerosis of aorta: Secondary | ICD-10-CM | POA: Diagnosis not present

## 2021-10-26 DIAGNOSIS — M79671 Pain in right foot: Secondary | ICD-10-CM | POA: Diagnosis not present

## 2021-10-26 DIAGNOSIS — I10 Essential (primary) hypertension: Secondary | ICD-10-CM

## 2021-10-26 DIAGNOSIS — F321 Major depressive disorder, single episode, moderate: Secondary | ICD-10-CM

## 2021-10-26 MED ORDER — OLMESARTAN MEDOXOMIL-HCTZ 40-12.5 MG PO TABS: 0.5000 | ORAL_TABLET | Freq: Every day | ORAL | 1 refills | Status: DC

## 2021-10-26 MED ORDER — ALPRAZOLAM 0.5 MG PO TABS: ORAL_TABLET | ORAL | 1 refills | Status: DC

## 2021-10-26 NOTE — Assessment & Plan Note (Signed)
>>  ASSESSMENT AND PLAN FOR DEPRESSION, MAJOR, SINGLE EPISODE, COMPLETE REMISSION ([facility]) WRITTEN ON 10/26/2021 12:53 PM BY CODY, JESSICA R, MD  No longer on daily SSRI.  Currently in remission.  Continue to monitor.

## 2021-10-26 NOTE — Assessment & Plan Note (Signed)
No longer on daily SSRI.  Currently in remission.  Continue to monitor. ?

## 2021-10-26 NOTE — Assessment & Plan Note (Signed)
Blood pressure is slightly low today, however patient without any symptoms.  Continue olmesartan hydrochlorothiazide 40-12.5 1/2 tablet and metoprolol 25 mg. ?

## 2021-10-26 NOTE — Progress Notes (Signed)
? ?Subjective:  ? ?  ?Caitlin Rogers is a 66 y.o. female presenting for Medication Refill ?  ? ? ?Medication Refill ? ? ?#HTN ?- no lightheadedness or dizziness ?- no cp, ha, vision changes ?- taking medication ?- continues to have ringing in ears ? ?#Depression/anxiety ?- is anticipating surgery ?- has not been able to do physical activity ?- was on antidepressant - but has stopped this zoloft ?- feels anxiety is worse in setting of rotator cuff injury needing surgery ?- trouble sleeping ?- will take the xanax with nervousness ? ?#Right toe injury ?- tripped on a plastic mat ?- had bunion surgery 20 years ago ?- fell forward ?- was in September ?- was swollen in the toe ?- some trouble walking ? ? ?Review of Systems ? ? ?Social History  ? ?Tobacco Use  ?Smoking Status Former  ?Smokeless Tobacco Never  ? ? ? ?   ?Objective:  ?  ?BP Readings from Last 3 Encounters:  ?10/26/21 100/60  ?10/14/21 (!) 154/77  ?07/01/21 (!) 133/58  ? ?Wt Readings from Last 3 Encounters:  ?10/26/21 168 lb 4 oz (76.3 kg)  ?10/14/21 165 lb (74.8 kg)  ?07/01/21 166 lb (75.3 kg)  ? ? ?BP 100/60   Pulse 88   Temp 98 ?F (36.7 ?C) (Oral)   Ht '5\' 3"'$  (1.6 m)   Wt 168 lb 4 oz (76.3 kg)   SpO2 100%   BMI 29.80 kg/m?  ? ? ?Physical Exam ?Constitutional:   ?   General: She is not in acute distress. ?   Appearance: She is well-developed. She is not diaphoretic.  ?HENT:  ?   Right Ear: External ear normal.  ?   Left Ear: External ear normal.  ?   Nose: Nose normal.  ?Eyes:  ?   Conjunctiva/sclera: Conjunctivae normal.  ?Cardiovascular:  ?   Rate and Rhythm: Normal rate and regular rhythm.  ?   Heart sounds: No murmur heard. ?Pulmonary:  ?   Effort: Pulmonary effort is normal.  ?   Breath sounds: Normal breath sounds.  ?Musculoskeletal:  ?   Cervical back: Neck supple.  ?   Comments: Right foot:  ?Inspection: swelling on the dorsal foot between 1st and 2nd metatarsal bones ?Palpation: TTP over area of swelling ?ROM: normal  ?Skin: ?   General: Skin  is warm and dry.  ?   Capillary Refill: Capillary refill takes less than 2 seconds.  ?Neurological:  ?   Mental Status: She is alert. Mental status is at baseline.  ?Psychiatric:     ?   Mood and Affect: Mood normal.     ?   Behavior: Behavior normal.  ? ? ? ? ? ?   ?Assessment & Plan:  ? ?Problem List Items Addressed This Visit   ? ?  ? Cardiovascular and Mediastinum  ? Essential hypertension - Primary  ?  Blood pressure is slightly low today, however patient without any symptoms.  Continue olmesartan hydrochlorothiazide 40-12.5 1/2 tablet and metoprolol 25 mg. ?  ?  ? Relevant Medications  ? olmesartan-hydrochlorothiazide (BENICAR HCT) 40-12.5 MG tablet  ? Aortic atherosclerosis ([place])  ?  On crestor 20 mg. Holding ASA due to upcoming surgery ?  ?  ? Relevant Medications  ? olmesartan-hydrochlorothiazide (BENICAR HCT) 40-12.5 MG tablet  ?  ? Other  ? Anxiety  ?  Stable.  At this point patient does not want to be on daily medication.  Discussed reducing dose of 0.5 mg Xanax to  up to 60 pills/year.  Reviewed database used approximately 150 last year.  Consider therapy or daily agent if needing more than this number of pills. ?  ?  ? Relevant Medications  ? ALPRAZolam (XANAX) 0.5 MG tablet  ? Depression, major, single episode, complete remission ([place])  ?  No longer on daily SSRI.  Currently in remission.  Continue to monitor. ?  ?  ? Relevant Medications  ? ALPRAZolam (XANAX) 0.5 MG tablet  ? Right foot pain  ?  History of bunion surgery, as well as stress fracture in the past.  Discussed x-ray versus podiatry referral and patient would like to do podiatry referral. ?  ?  ? Relevant Orders  ? Ambulatory referral to Podiatry  ? ? ? ?Return if symptoms worsen or fail to improve. ? ?Lesleigh Noe, MD ? ?This visit occurred during the SARS-CoV-2 public health emergency.  Safety protocols were in place, including screening questions prior to the visit, additional usage of staff PPE, and extensive cleaning of exam room  while observing appropriate contact time as indicated for disinfecting solutions.  ? ?

## 2021-10-26 NOTE — Patient Instructions (Signed)
How to help anxiety - without medication.  ? ?1) Regular Exercise - walking, jogging, cycling, dancing, strength training ?--> Yoga has been shown in research to reduce depression and anxiety -- with even just one hour long session per week ? ?2)  Begin a Mindfulness/Meditation practice -- this can take a little as 3 minutes and is helpful for all kinds of mood issues ?-- You can find resources in books ?-- Or you can download apps like  ?---- Headspace App (which currently has free content called "Weathering the Storm") ?---- Calm (which has a few free options)  ?---- Insignt Timer ?---- Stop, Breathe & Think ? ?# With each of these Apps - you should decline the "start free trial" offer and as you search through the App should be able to access some of their free content. You can also chose to pay for the content if you find one that works well for you.  ? ?# Many of them also offer sleep specific content which may help with insomnia ? ?3) Healthy Diet ?-- Avoid or decrease Caffeine ?-- Avoid or decrease Alcohol ?-- Drink plenty of water, have a balanced diet ?-- Avoid cigarettes and marijuana (as well as other recreational drugs) ? ?4) Consider contacting a professional therapist  ?-- [facility] is one option. Call [phone] ?-- Or you can check out www.psychologytoday.com -- you can read bios of therapists and see if they accept insurance ?-- Check with your insurance to see if you have coverage and who may take your insurance ?  ? ? ?

## 2021-10-26 NOTE — Assessment & Plan Note (Signed)
Stable.  At this point patient does not want to be on daily medication.  Discussed reducing dose of 0.5 mg Xanax to up to 60 pills/year.  Reviewed database used approximately 150 last year.  Consider therapy or daily agent if needing more than this number of pills. ?

## 2021-10-26 NOTE — Assessment & Plan Note (Signed)
History of bunion surgery, as well as stress fracture in the past.  Discussed x-ray versus podiatry referral and patient would like to do podiatry referral. ?

## 2021-10-26 NOTE — Assessment & Plan Note (Signed)
On crestor 20 mg. Holding ASA due to upcoming surgery ?

## 2021-10-28 ENCOUNTER — Other Ambulatory Visit: Payer: Self-pay

## 2021-10-28 ENCOUNTER — Encounter
Admission: RE | Disposition: A | Discharge status: Home / Self Care | Payer: Self-pay | Source: Home / Self Care | Attending: Orthopedic Surgery

## 2021-10-28 ENCOUNTER — Ambulatory Visit: Payer: BC Managed Care – PPO | Admitting: Anesthesiology

## 2021-10-28 ENCOUNTER — Observation Stay
Admission: RE | Admit: 2021-10-28 | Discharge: 2021-10-29 | Disposition: A | Discharge status: Home / Self Care | Payer: BC Managed Care – PPO | Attending: Orthopedic Surgery | Admitting: Orthopedic Surgery

## 2021-10-28 ENCOUNTER — Ambulatory Visit: Payer: BC Managed Care – PPO

## 2021-10-28 ENCOUNTER — Observation Stay: Payer: BC Managed Care – PPO

## 2021-10-28 ENCOUNTER — Encounter: Payer: Self-pay | Admitting: Orthopedic Surgery

## 2021-10-28 DIAGNOSIS — M75102 Unspecified rotator cuff tear or rupture of left shoulder, not specified as traumatic: Principal | ICD-10-CM | POA: Insufficient documentation

## 2021-10-28 DIAGNOSIS — M25512 Pain in left shoulder: Secondary | ICD-10-CM | POA: Diagnosis not present

## 2021-10-28 DIAGNOSIS — Z8616 Personal history of COVID-19: Secondary | ICD-10-CM | POA: Insufficient documentation

## 2021-10-28 DIAGNOSIS — M1990 Unspecified osteoarthritis, unspecified site: Secondary | ICD-10-CM | POA: Diagnosis not present

## 2021-10-28 DIAGNOSIS — I10 Essential (primary) hypertension: Secondary | ICD-10-CM | POA: Insufficient documentation

## 2021-10-28 DIAGNOSIS — Z79899 Other long term (current) drug therapy: Secondary | ICD-10-CM | POA: Diagnosis not present

## 2021-10-28 DIAGNOSIS — Z471 Aftercare following joint replacement surgery: Secondary | ICD-10-CM | POA: Diagnosis not present

## 2021-10-28 DIAGNOSIS — M75100 Unspecified rotator cuff tear or rupture of unspecified shoulder, not specified as traumatic: Secondary | ICD-10-CM

## 2021-10-28 DIAGNOSIS — M75122 Complete rotator cuff tear or rupture of left shoulder, not specified as traumatic: Secondary | ICD-10-CM | POA: Diagnosis not present

## 2021-10-28 DIAGNOSIS — M7522 Bicipital tendinitis, left shoulder: Secondary | ICD-10-CM | POA: Diagnosis not present

## 2021-10-28 DIAGNOSIS — Z96642 Presence of left artificial hip joint: Secondary | ICD-10-CM | POA: Diagnosis not present

## 2021-10-28 DIAGNOSIS — G8918 Other acute postprocedural pain: Secondary | ICD-10-CM | POA: Diagnosis not present

## 2021-10-28 DIAGNOSIS — Z96612 Presence of left artificial shoulder joint: Secondary | ICD-10-CM | POA: Diagnosis not present

## 2021-10-28 HISTORY — PX: REVERSE SHOULDER ARTHROPLASTY: SHX5054

## 2021-10-28 HISTORY — PX: BICEPT TENODESIS: SHX5116

## 2021-10-28 HISTORY — DX: Unspecified rotator cuff tear or rupture of unspecified shoulder, not specified as traumatic: M75.100

## 2021-10-28 LAB — ABO/RH: ABO/RH(D): A POS

## 2021-10-28 SURGERY — ARTHROPLASTY, SHOULDER, TOTAL, REVERSE
Anesthesia: General | Site: Shoulder | Laterality: Left

## 2021-10-28 MED ORDER — OLMESARTAN MEDOXOMIL-HCTZ 40-12.5 MG PO TABS: 0.5000 | ORAL_TABLET | Freq: Every day | ORAL | Status: DC

## 2021-10-28 MED ORDER — FENTANYL CITRATE (PF) 100 MCG/2ML IJ SOLN
INTRAMUSCULAR | Status: DC | PRN
  Administered 2021-10-28: 50 ug via INTRAVENOUS
  Administered 2021-10-28: 25 ug via INTRAVENOUS
  Administered 2021-10-28: 50 ug via INTRAVENOUS
  Administered 2021-10-28: 25 ug via INTRAVENOUS

## 2021-10-28 MED ORDER — VANCOMYCIN HCL 1000 MG IV SOLR
INTRAVENOUS | Status: AC
Start: 2021-10-28 — End: ?
  Filled 2021-10-28: qty 20

## 2021-10-28 MED ORDER — TRANEXAMIC ACID-NACL 1000-0.7 MG/100ML-% IV SOLN
INTRAVENOUS | Status: AC
Start: 2021-10-28 — End: 2021-10-29
  Administered 2021-10-28: 1000 mg via INTRAVENOUS
  Filled 2021-10-28: qty 100

## 2021-10-28 MED ORDER — ONDANSETRON HCL 4 MG PO TABS: 4.0000 mg | ORAL_TABLET | Freq: Four times a day (QID) | ORAL | Status: DC | PRN

## 2021-10-28 MED ORDER — SUGAMMADEX SODIUM 200 MG/2ML IV SOLN
INTRAVENOUS | Status: DC | PRN
  Administered 2021-10-28: 200 mg via INTRAVENOUS

## 2021-10-28 MED ORDER — HYDROCHLOROTHIAZIDE 12.5 MG PO TABS
12.5000 mg | ORAL_TABLET | Freq: Every day | ORAL | Status: DC
  Administered 2021-10-29: 12.5 mg via ORAL
  Filled 2021-10-28: qty 1

## 2021-10-28 MED ORDER — OXYCODONE HCL 5 MG PO TABS: 10.0000 mg | ORAL_TABLET | ORAL | Status: DC | PRN

## 2021-10-28 MED ORDER — MIDAZOLAM HCL 2 MG/2ML IJ SOLN: 1.0000 mg | INTRAMUSCULAR | Status: DC | PRN

## 2021-10-28 MED ORDER — SODIUM CHLORIDE 0.9 % IR SOLN
Status: DC | PRN
  Administered 2021-10-28: 1004 mL

## 2021-10-28 MED ORDER — CEFAZOLIN SODIUM-DEXTROSE 2-4 GM/100ML-% IV SOLN
2.0000 g | Freq: Four times a day (QID) | INTRAVENOUS | Status: AC
  Administered 2021-10-28 – 2021-10-29 (×2): 2 g via INTRAVENOUS
  Filled 2021-10-28 (×3): qty 100

## 2021-10-28 MED ORDER — TRANEXAMIC ACID-NACL 1000-0.7 MG/100ML-% IV SOLN
INTRAVENOUS | Status: AC
  Filled 2021-10-28: qty 100

## 2021-10-28 MED ORDER — ASPIRIN EC 325 MG PO TBEC
325.0000 mg | DELAYED_RELEASE_TABLET | Freq: Every day | ORAL | Status: DC
  Administered 2021-10-29: 325 mg via ORAL
  Filled 2021-10-28: qty 1

## 2021-10-28 MED ORDER — DEXAMETHASONE SODIUM PHOSPHATE 10 MG/ML IJ SOLN
INTRAMUSCULAR | Status: AC
  Filled 2021-10-28: qty 1

## 2021-10-28 MED ORDER — BUPIVACAINE LIPOSOME 1.3 % IJ SUSP
INTRAMUSCULAR | Status: DC | PRN
  Administered 2021-10-28: 10 mL via PERINEURAL

## 2021-10-28 MED ORDER — MIDAZOLAM HCL 2 MG/2ML IJ SOLN
INTRAMUSCULAR | Status: AC
  Filled 2021-10-28: qty 2

## 2021-10-28 MED ORDER — METOPROLOL SUCCINATE ER 25 MG PO TB24
25.0000 mg | ORAL_TABLET | Freq: Every day | ORAL | Status: DC
  Administered 2021-10-29: 25 mg via ORAL
  Filled 2021-10-28: qty 1

## 2021-10-28 MED ORDER — SODIUM CHLORIDE 0.9 % IV SOLN
Status: DC | PRN
  Administered 2021-10-28: 1004 mL

## 2021-10-28 MED ORDER — TRIAMCINOLONE ACETONIDE 0.1 % EX CREA
1.0000 "application " | TOPICAL_CREAM | Freq: Two times a day (BID) | CUTANEOUS | Status: DC
  Administered 2021-10-28 (×2): 1 via TOPICAL
  Filled 2021-10-28: qty 15

## 2021-10-28 MED ORDER — BISACODYL 10 MG RE SUPP: 10.0000 mg | Freq: Every day | RECTAL | Status: DC | PRN

## 2021-10-28 MED ORDER — FENTANYL CITRATE (PF) 100 MCG/2ML IJ SOLN: 25.0000 ug | INTRAMUSCULAR | Status: DC | PRN

## 2021-10-28 MED ORDER — SUCCINYLCHOLINE CHLORIDE 200 MG/10ML IV SOSY
PREFILLED_SYRINGE | INTRAVENOUS | Status: DC | PRN
  Administered 2021-10-28: 100 mg via INTRAVENOUS

## 2021-10-28 MED ORDER — BUPIVACAINE LIPOSOME 1.3 % IJ SUSP
INTRAMUSCULAR | Status: AC
  Filled 2021-10-28: qty 10

## 2021-10-28 MED ORDER — CHLORHEXIDINE GLUCONATE 0.12 % MT SOLN
OROMUCOSAL | Status: AC
  Administered 2021-10-28: 15 mL via OROMUCOSAL
  Filled 2021-10-28: qty 15

## 2021-10-28 MED ORDER — ORAL CARE MOUTH RINSE: 15.0000 mL | Freq: Once | OROMUCOSAL | Status: AC

## 2021-10-28 MED ORDER — LIDOCAINE HCL (PF) 2 % IJ SOLN
INTRAMUSCULAR | Status: AC
  Filled 2021-10-28: qty 5

## 2021-10-28 MED ORDER — NEOMYCIN-POLYMYXIN B GU 40-200000 IR SOLN
Status: AC
Start: 2021-10-28 — End: ?
  Filled 2021-10-28: qty 1

## 2021-10-28 MED ORDER — OXYCODONE HCL 5 MG/5ML PO SOLN: 5.0000 mg | Freq: Once | ORAL | Status: DC | PRN

## 2021-10-28 MED ORDER — HYDROMORPHONE HCL 1 MG/ML IJ SOLN: 0.2000 mg | INTRAMUSCULAR | Status: DC | PRN

## 2021-10-28 MED ORDER — ACETAMINOPHEN 10 MG/ML IV SOLN
INTRAVENOUS | Status: DC | PRN
  Administered 2021-10-28: 1000 mg via INTRAVENOUS

## 2021-10-28 MED ORDER — MIDAZOLAM HCL 2 MG/2ML IJ SOLN
INTRAMUSCULAR | Status: DC | PRN
  Administered 2021-10-28: 1 mg via INTRAVENOUS

## 2021-10-28 MED ORDER — CHLORHEXIDINE GLUCONATE 0.12 % MT SOLN: 15.0000 mL | Freq: Once | OROMUCOSAL | Status: AC

## 2021-10-28 MED ORDER — FENTANYL CITRATE PF 50 MCG/ML IJ SOSY: 50.0000 ug | PREFILLED_SYRINGE | Freq: Once | INTRAMUSCULAR | Status: AC

## 2021-10-28 MED ORDER — FENTANYL CITRATE (PF) 100 MCG/2ML IJ SOLN
INTRAMUSCULAR | Status: AC
  Filled 2021-10-28: qty 2

## 2021-10-28 MED ORDER — ONDANSETRON HCL 4 MG/2ML IJ SOLN: 4.0000 mg | Freq: Once | INTRAMUSCULAR | Status: DC | PRN

## 2021-10-28 MED ORDER — ALUM & MAG HYDROXIDE-SIMETH 200-200-20 MG/5ML PO SUSP: 30.0000 mL | ORAL | Status: DC | PRN

## 2021-10-28 MED ORDER — ALPRAZOLAM 0.5 MG PO TABS: 0.5000 mg | ORAL_TABLET | Freq: Two times a day (BID) | ORAL | Status: DC | PRN

## 2021-10-28 MED ORDER — ROSUVASTATIN CALCIUM 10 MG PO TABS
20.0000 mg | ORAL_TABLET | Freq: Every day | ORAL | Status: DC
  Administered 2021-10-29: 20 mg via ORAL
  Filled 2021-10-28: qty 2

## 2021-10-28 MED ORDER — IRBESARTAN 150 MG PO TABS
300.0000 mg | ORAL_TABLET | Freq: Every day | ORAL | Status: DC
  Administered 2021-10-29: 300 mg via ORAL
  Filled 2021-10-28: qty 2

## 2021-10-28 MED ORDER — DOCUSATE SODIUM 100 MG PO CAPS
100.0000 mg | ORAL_CAPSULE | Freq: Two times a day (BID) | ORAL | Status: DC
  Administered 2021-10-28 – 2021-10-29 (×3): 100 mg via ORAL
  Filled 2021-10-28 (×3): qty 1

## 2021-10-28 MED ORDER — TRANEXAMIC ACID-NACL 1000-0.7 MG/100ML-% IV SOLN
1000.0000 mg | INTRAVENOUS | Status: AC
  Administered 2021-10-28: 1000 mg via INTRAVENOUS

## 2021-10-28 MED ORDER — OXYCODONE HCL 5 MG PO TABS: 5.0000 mg | ORAL_TABLET | Freq: Once | ORAL | Status: DC | PRN

## 2021-10-28 MED ORDER — SUCCINYLCHOLINE CHLORIDE 200 MG/10ML IV SOSY
PREFILLED_SYRINGE | INTRAVENOUS | Status: AC
  Filled 2021-10-28: qty 10

## 2021-10-28 MED ORDER — OXYCODONE HCL 5 MG PO TABS
5.0000 mg | ORAL_TABLET | ORAL | Status: DC | PRN
  Administered 2021-10-28: 10 mg via ORAL
  Administered 2021-10-29: 5 mg via ORAL
  Filled 2021-10-28 (×2): qty 2

## 2021-10-28 MED ORDER — TRANEXAMIC ACID-NACL 1000-0.7 MG/100ML-% IV SOLN: 1000.0000 mg | Freq: Once | INTRAVENOUS | Status: AC

## 2021-10-28 MED ORDER — ONDANSETRON HCL 4 MG/2ML IJ SOLN
INTRAMUSCULAR | Status: DC | PRN
  Administered 2021-10-28: 4 mg via INTRAVENOUS

## 2021-10-28 MED ORDER — PROPOFOL 10 MG/ML IV BOLUS
INTRAVENOUS | Status: AC
  Filled 2021-10-28: qty 20

## 2021-10-28 MED ORDER — PANTOPRAZOLE SODIUM 40 MG PO TBEC
80.0000 mg | DELAYED_RELEASE_TABLET | Freq: Every day | ORAL | Status: DC
  Administered 2021-10-29: 80 mg via ORAL
  Filled 2021-10-28: qty 2

## 2021-10-28 MED ORDER — ACETAMINOPHEN 10 MG/ML IV SOLN
INTRAVENOUS | Status: AC
  Filled 2021-10-28: qty 100

## 2021-10-28 MED ORDER — FENTANYL CITRATE PF 50 MCG/ML IJ SOSY
PREFILLED_SYRINGE | INTRAMUSCULAR | Status: AC
  Administered 2021-10-28: 50 ug via INTRAVENOUS
  Filled 2021-10-28: qty 1

## 2021-10-28 MED ORDER — MIDAZOLAM HCL 2 MG/2ML IJ SOLN
INTRAMUSCULAR | Status: AC
  Administered 2021-10-28: 1 mg via INTRAVENOUS
  Filled 2021-10-28: qty 2

## 2021-10-28 MED ORDER — MENTHOL 3 MG MT LOZG: 1.0000 | LOZENGE | OROMUCOSAL | Status: DC | PRN

## 2021-10-28 MED ORDER — METOCLOPRAMIDE HCL 5 MG/ML IJ SOLN: 5.0000 mg | Freq: Three times a day (TID) | INTRAMUSCULAR | Status: DC | PRN

## 2021-10-28 MED ORDER — CEFAZOLIN SODIUM-DEXTROSE 2-4 GM/100ML-% IV SOLN
INTRAVENOUS | Status: AC
  Administered 2021-10-28: 2 g via INTRAVENOUS
  Filled 2021-10-28: qty 100

## 2021-10-28 MED ORDER — PHENOL 1.4 % MT LIQD: 1.0000 | OROMUCOSAL | Status: DC | PRN

## 2021-10-28 MED ORDER — CEFAZOLIN SODIUM-DEXTROSE 2-4 GM/100ML-% IV SOLN
2.0000 g | INTRAVENOUS | Status: AC
  Administered 2021-10-28: 2 g via INTRAVENOUS

## 2021-10-28 MED ORDER — EPHEDRINE SULFATE (PRESSORS) 50 MG/ML IJ SOLN
INTRAMUSCULAR | Status: DC | PRN
  Administered 2021-10-28 (×2): 10 mg via INTRAVENOUS

## 2021-10-28 MED ORDER — BUPIVACAINE HCL (PF) 0.5 % IJ SOLN
INTRAMUSCULAR | Status: AC
  Filled 2021-10-28: qty 10

## 2021-10-28 MED ORDER — PROPOFOL 10 MG/ML IV BOLUS
INTRAVENOUS | Status: DC | PRN
Start: 2021-10-28 — End: 2021-10-28
  Administered 2021-10-28: 150 mg via INTRAVENOUS

## 2021-10-28 MED ORDER — PHENYLEPHRINE HCL-NACL 20-0.9 MG/250ML-% IV SOLN
INTRAVENOUS | Status: DC | PRN
  Administered 2021-10-28: 50 ug/min via INTRAVENOUS

## 2021-10-28 MED ORDER — BUPIVACAINE-EPINEPHRINE (PF) 0.5% -1:200000 IJ SOLN
INTRAMUSCULAR | Status: AC
  Filled 2021-10-28: qty 30

## 2021-10-28 MED ORDER — LIDOCAINE HCL (CARDIAC) PF 100 MG/5ML IV SOSY
PREFILLED_SYRINGE | INTRAVENOUS | Status: DC | PRN
  Administered 2021-10-28: 80 mg via INTRAVENOUS

## 2021-10-28 MED ORDER — ROCURONIUM BROMIDE 10 MG/ML (PF) SYRINGE
PREFILLED_SYRINGE | INTRAVENOUS | Status: AC
  Filled 2021-10-28: qty 10

## 2021-10-28 MED ORDER — HYDROMORPHONE HCL 2 MG PO TABS: 1.0000 mg | ORAL_TABLET | ORAL | Status: DC | PRN

## 2021-10-28 MED ORDER — DEXAMETHASONE SODIUM PHOSPHATE 10 MG/ML IJ SOLN
INTRAMUSCULAR | Status: DC | PRN
  Administered 2021-10-28: 5 mg via INTRAVENOUS

## 2021-10-28 MED ORDER — ACETAMINOPHEN 10 MG/ML IV SOLN: 1000.0000 mg | Freq: Once | INTRAVENOUS | Status: DC | PRN

## 2021-10-28 MED ORDER — VANCOMYCIN HCL 1000 MG IV SOLR
INTRAVENOUS | Status: DC | PRN
Start: 2021-10-28 — End: 2021-10-28
  Administered 2021-10-28: 1000 mg

## 2021-10-28 MED ORDER — ONDANSETRON HCL 4 MG/2ML IJ SOLN
INTRAMUSCULAR | Status: AC
  Filled 2021-10-28: qty 2

## 2021-10-28 MED ORDER — SENNOSIDES-DOCUSATE SODIUM 8.6-50 MG PO TABS: 1.0000 | ORAL_TABLET | Freq: Every evening | ORAL | Status: DC | PRN

## 2021-10-28 MED ORDER — PHENYLEPHRINE HCL (PRESSORS) 10 MG/ML IV SOLN
INTRAVENOUS | Status: DC | PRN
  Administered 2021-10-28: 120 ug via INTRAVENOUS

## 2021-10-28 MED ORDER — ONDANSETRON HCL 4 MG/2ML IJ SOLN: 4.0000 mg | Freq: Four times a day (QID) | INTRAMUSCULAR | Status: DC | PRN

## 2021-10-28 MED ORDER — LACTATED RINGERS IV SOLN: INTRAVENOUS | Status: DC

## 2021-10-28 MED ORDER — HYDROMORPHONE HCL 2 MG PO TABS: 2.0000 mg | ORAL_TABLET | ORAL | Status: DC | PRN

## 2021-10-28 MED ORDER — ROCURONIUM BROMIDE 100 MG/10ML IV SOLN
INTRAVENOUS | Status: DC | PRN
  Administered 2021-10-28: 30 mg via INTRAVENOUS
  Administered 2021-10-28: 20 mg via INTRAVENOUS
  Administered 2021-10-28: 15 mg via INTRAVENOUS

## 2021-10-28 MED ORDER — BUPIVACAINE HCL (PF) 0.5 % IJ SOLN
INTRAMUSCULAR | Status: DC | PRN
  Administered 2021-10-28: 10 mL via PERINEURAL

## 2021-10-28 MED ORDER — ACETAMINOPHEN 500 MG PO TABS
1000.0000 mg | ORAL_TABLET | Freq: Three times a day (TID) | ORAL | Status: DC
  Administered 2021-10-28 – 2021-10-29 (×2): 1000 mg via ORAL
  Filled 2021-10-28 (×3): qty 2

## 2021-10-28 MED ORDER — SODIUM CHLORIDE 0.9 % IV SOLN: INTRAVENOUS | Status: DC

## 2021-10-28 MED ORDER — METOCLOPRAMIDE HCL 10 MG PO TABS: 5.0000 mg | ORAL_TABLET | Freq: Three times a day (TID) | ORAL | Status: DC | PRN

## 2021-10-28 SURGICAL SUPPLY — 69 items
BASEPLATE P2 COATD GLND 6.5X30 (Shoulder) IMPLANT
BIT DRILL 4 DIA CALIBRATED (BIT) ×1 IMPLANT
BIT DRILL GUIDE PATIENT MATCH (MISCELLANEOUS) IMPLANT
BLADE SAGITTAL AGGR TOOTH XLG (BLADE) ×1 IMPLANT
CHLORAPREP W/TINT 26 (MISCELLANEOUS) ×2 IMPLANT
CNTNR SPEC 2.5X3XGRAD LEK (MISCELLANEOUS) ×1
CONT SPEC 4OZ STER OR WHT (MISCELLANEOUS) ×1
CONTAINER SPEC 2.5X3XGRAD LEK (MISCELLANEOUS) ×1 IMPLANT
COOLER POLAR GLACIER W/PUMP (MISCELLANEOUS) ×2 IMPLANT
COVER BACK TABLE REUSABLE LG (DRAPES) ×2 IMPLANT
DERMABOND ADVANCED (GAUZE/BANDAGES/DRESSINGS) ×1
DERMABOND ADVANCED .7 DNX12 (GAUZE/BANDAGES/DRESSINGS) ×1 IMPLANT
DRAPE 3/4 80X56 (DRAPES) ×4 IMPLANT
DRAPE INCISE IOBAN 66X45 STRL (DRAPES) ×4 IMPLANT
DRAPE U-SHAPE 47X51 STRL (DRAPES) ×2 IMPLANT
DRILL GUIDE PATIENT MATCH (MISCELLANEOUS) ×2
DRSG OPSITE POSTOP 4X8 (GAUZE/BANDAGES/DRESSINGS) ×1 IMPLANT
DRSG TEGADERM 4X10 (GAUZE/BANDAGES/DRESSINGS) ×3 IMPLANT
ELECT REM PT RETURN 9FT ADLT (ELECTROSURGICAL) ×2
ELECTRODE REM PT RTRN 9FT ADLT (ELECTROSURGICAL) ×1 IMPLANT
GAUZE XEROFORM 1X8 LF (GAUZE/BANDAGES/DRESSINGS) ×2 IMPLANT
GLOVE SRG 8 PF TXTR STRL LF DI (GLOVE) ×2 IMPLANT
GLOVE SURG GAMMEX PI TX LF 7.5 (GLOVE) ×4 IMPLANT
GLOVE SURG ORTHO LTX SZ8 (GLOVE) ×4 IMPLANT
GLOVE SURG SYN 8.0 (GLOVE) ×2 IMPLANT
GLOVE SURG SYN 8.0 PF PI (GLOVE) ×1 IMPLANT
GLOVE SURG UNDER POLY LF SZ8 (GLOVE) ×2
GOWN STRL REUS W/ TWL LRG LVL3 (GOWN DISPOSABLE) ×2 IMPLANT
GOWN STRL REUS W/ TWL XL LVL3 (GOWN DISPOSABLE) ×1 IMPLANT
GOWN STRL REUS W/TWL LRG LVL3 (GOWN DISPOSABLE) ×2
GOWN STRL REUS W/TWL XL LVL3 (GOWN DISPOSABLE) ×1
HEMOVAC 400CC 10FR (MISCELLANEOUS) ×1 IMPLANT
HOOD PEEL AWAY FLYTE STAYCOOL (MISCELLANEOUS) ×7 IMPLANT
HUMERA STEM SM SHELL SHOU 10 (Miscellaneous) ×2 IMPLANT
INSERT SMALL SOCKET 32MM NEU (Insert) ×1 IMPLANT
KIT STABILIZATION SHOULDER (MISCELLANEOUS) ×2 IMPLANT
MANIFOLD NEPTUNE II (INSTRUMENTS) ×2 IMPLANT
MASK FACE SPIDER DISP (MASK) ×2 IMPLANT
MAT ABSORB  FLUID 56X50 GRAY (MISCELLANEOUS) ×2
MAT ABSORB FLUID 56X50 GRAY (MISCELLANEOUS) ×2 IMPLANT
NDL REVERSE CUT 1/2 CRC (NEEDLE) ×1 IMPLANT
NDL SPNL 20GX3.5 QUINCKE YW (NEEDLE) ×1 IMPLANT
NEEDLE REVERSE CUT 1/2 CRC (NEEDLE) ×2 IMPLANT
NEEDLE SPNL 20GX3.5 QUINCKE YW (NEEDLE) IMPLANT
NS IRRIG 1000ML POUR BTL (IV SOLUTION) ×2 IMPLANT
P2 COATDE GLNOID BSEPLT 6.5X30 (Shoulder) ×2 IMPLANT
PACK ARTHROSCOPY SHOULDER (MISCELLANEOUS) ×2 IMPLANT
PAD WRAPON POLAR SHDR XLG (MISCELLANEOUS) ×1 IMPLANT
PULSAVAC PLUS IRRIG FAN TIP (DISPOSABLE) ×2
SCREW BONE LOCKING RSP 5.0X14 (Screw) ×2 IMPLANT
SCREW BONE LOCKING RSP 5.0X30 (Screw) ×4 IMPLANT
SCREW BONE RSP LOCK 5X14 (Screw) IMPLANT
SCREW BONE RSP LOCK 5X30 (Screw) IMPLANT
SCREW RETAIN W/HEAD 4MM OFFSET (Shoulder) ×1 IMPLANT
SLING ULTRA II LG (MISCELLANEOUS) ×2 IMPLANT
SPONGE T-LAP 18X18 ~~LOC~~+RFID (SPONGE) ×9 IMPLANT
STEM HUMERAL SM SHELL SHOU 10 (Miscellaneous) IMPLANT
STRAP SAFETY 5IN WIDE (MISCELLANEOUS) ×4 IMPLANT
SUT FIBERWIRE #2 38 BLUE 1/2 (SUTURE) ×6
SUT MNCRL AB 4-0 PS2 18 (SUTURE) ×1 IMPLANT
SUT TICRON 2-0 30IN 311381 (SUTURE) ×5 IMPLANT
SUT VIC AB 0 CT1 36 (SUTURE) ×2 IMPLANT
SUT VIC AB 2-0 CT2 27 (SUTURE) ×4 IMPLANT
SUTURE FIBERWR #2 38 BLUE 1/2 (SUTURE) ×4 IMPLANT
SYR 20ML LL LF (SYRINGE) ×2 IMPLANT
SYR 30ML LL (SYRINGE) ×4 IMPLANT
TIP FAN IRRIG PULSAVAC PLUS (DISPOSABLE) ×1 IMPLANT
WATER STERILE IRR 500ML POUR (IV SOLUTION) ×2 IMPLANT
WRAPON POLAR PAD SHDR XLG (MISCELLANEOUS) ×2

## 2021-10-28 NOTE — Transfer of Care (Signed)
Immediate Anesthesia Transfer of Care Note ? ?Patient: Caitlin Rogers ? ?Procedure(s) Performed: Left reverse shoulder arthroplasty, biceps tenodesis (Left: Shoulder) ?BICEPS TENODESIS (Left: Shoulder) ? ?Patient Location: PACU ? ?Anesthesia Type:General ? ?Level of Consciousness: awake and alert  ? ?Airway & Oxygen Therapy: Patient Spontanous Breathing and Patient connected to face mask oxygen ? ?Post-op Assessment: Report given to RN and Post -op Vital signs reviewed and stable ? ?Post vital signs: Reviewed and stable ? ?Last Vitals:  ?Vitals Value Taken Time  ?BP 109/52 10/28/21 1111  ?Temp    ?Pulse 96 10/28/21 1114  ?Resp 14 10/28/21 1114  ?SpO2 100 % 10/28/21 1114  ?Vitals shown include unvalidated device data. ? ?Last Pain:  ?Vitals:  ? 10/28/21 0636  ?TempSrc: Tympanic  ?PainSc: 7   ?   ? ?  ? ?Complications: No notable events documented. ?

## 2021-10-28 NOTE — H&P (Signed)
Paper H&P to be scanned into permanent record. H&P reviewed. No significant changes noted.  

## 2021-10-28 NOTE — Anesthesia Preprocedure Evaluation (Signed)
Anesthesia Evaluation  ?Patient identified by MRN, date of birth, ID band ?Patient awake ? ? ? ?Reviewed: ?Allergy & Precautions, NPO status , Patient's Chart, lab work & pertinent test results ? ?History of Anesthesia Complications ?Negative for: history of anesthetic complications ? ?Airway ?Mallampati: II ? ?TM Distance: >3 FB ?Neck ROM: Full ? ? ? Dental ?no notable dental hx. ?(+) Teeth Intact, Implants ?  ?Pulmonary ?neg pulmonary ROS, neg sleep apnea, neg COPD, Patient abstained from smoking.Not current smoker, former smoker,  ?  ?Pulmonary exam normal ?breath sounds clear to auscultation ? ? ? ? ? ? Cardiovascular ?Exercise Tolerance: Good ?METShypertension, Pt. on medications ?+ CAD  ?(-) Past MI (-) dysrhythmias  ?Rhythm:Regular Rate:Normal ?- Systolic murmurs ?1. Global longitudinal strain is -18.9%. Left ventricular ejection  ?fraction, by estimation, is 60 to 65%. The left ventricle has normal  ?function. The left ventricle has no regional wall motion abnormalities.  ?There is mild left ventricular hypertrophy.  ?Left ventricular diastolic parameters were normal.  ??2. Right ventricular systolic function is normal. The right ventricular  ?size is normal. There is mildly elevated pulmonary artery systolic  ?pressure.  ??3. The mitral valve is normal in structure. Mild mitral valve  ?regurgitation.  ??4. The aortic valve is tricuspid. Aortic valve regurgitation is trivial.  ?Mild aortic valve sclerosis is present, with no evidence of aortic valve  ?stenosis.  ?  ?Neuro/Psych ?PSYCHIATRIC DISORDERS Anxiety Depression negative neurological ROS ?   ? GI/Hepatic ?GERD  Medicated and Controlled,(+)  ?  ? (-) substance abuse ? ,   ?Endo/Other  ?neg diabetes ? Renal/GU ?negative Renal ROS  ? ?  ?Musculoskeletal ? ? Abdominal ?  ?Peds ? Hematology ?  ?Anesthesia Other Findings ?Past Medical History: ?No date: Anxiety ?09/29/2020: Aortic atherosclerosis ([place]) ?    Comment:  On  coronary CT  ?No date: Arthritis ?11/11/2016: Atypical chest pain ?01/19/2020: Cardiac murmur ?02/13/2020: Cervical radiculopathy ?01/19/2020: Chest discomfort ?10/07/2020: Coronary artery calcification ?06/2019: COVID-19 ?07/06/2020: Current moderate episode of major depressive disorder  ?without prior episode ([place]) ?11/01/2020: Dermatitis ?11/11/2016: Essential hypertension ?11/11/2016: GERD (gastroesophageal reflux disease) ?No date: Hyperlipemia ?11/11/2016: Hyperlipidemia ?No date: Hypertension ?01/02/2020: Numbness ?01/19/2020: Other spondylosis with myelopathy, cervical region ?11/11/2016: Palpitations ?No date: Ringing in the ears ?    Comment:  post covid symptom ? Reproductive/Obstetrics ? ?  ? ? ? ? ? ? ? ? ? ? ? ? ? ?  ?  ? ? ? ? ? ? ? ? ?Anesthesia Physical ?Anesthesia Plan ? ?ASA: 2 ? ?Anesthesia Plan: General  ? ?Post-op Pain Management: Ofirmev IV (intra-op)* and Regional block*  ? ?Induction: Intravenous ? ?PONV Risk Score and Plan: 3 and Ondansetron, Dexamethasone, Midazolam and Treatment may vary due to age or medical condition ? ?Airway Management Planned: Oral ETT ? ?Additional Equipment: None ? ?Intra-op Plan:  ? ?Post-operative Plan: Extubation in OR ? ?Informed Consent: I have reviewed the patients History and Physical, chart, labs and discussed the procedure including the risks, benefits and alternatives for the proposed anesthesia with the patient or authorized representative who has indicated his/her understanding and acceptance.  ? ? ? ?Dental advisory given ? ?Plan Discussed with: CRNA and Surgeon ? ?Anesthesia Plan Comments: (Discussed risks of anesthesia with patient, including PONV, sore throat, lip/dental/eye damage. Rare risks discussed as well, such as cardiorespiratory and neurological sequelae, and allergic reactions. Discussed the role of CRNA in patient's perioperative care. Patient understands. ?Discussed r/b/a of interscalene block, including elective nature. Risks  discussed: ?-  Rare: bleeding, infection, nerve damage ?- shortness of breath from hemidiaphragmatic paralysis ?- unilateral horner's syndrome ?- poor/non-working blocks ?- reactions and toxicity to local anesthetic ?Patient understands and agrees. ?)  ? ? ? ? ? ? ?Anesthesia Quick Evaluation ? ?

## 2021-10-28 NOTE — Evaluation (Signed)
Physical Therapy Evaluation ?Patient Details ?Name: Caitlin Rogers ?MRN: [id number] ?DOB: 10/01/1955 ?Today's Date: 10/28/2021 ? ?History of Present Illness ? Pt is a 66 y.o. female s/p L reverse total shoulder arthroplasty and L biceps tenodesis secondary to rotator cuff retear (s/p interscalene brachial plexus block) 10/28/21.  PMH includes htn, CAD, anxiety, cervical radiculopathy, COVID-19, and dermatitis.  ?Clinical Impression ? Prior to surgery, pt was independent with functional mobility; lives with her husband on main level of home with 1 STE.  Currently pt is modified independent with bed mobility; SBA with transfers; and CGA to SBA ambulating 270 feet (light single UE support on IV pole per pt preference).  Pt with 2/10 L lateral neck pain end of session at rest (pt reports having pain here for last month).  Pt would benefit from skilled PT to address noted impairments and functional limitations (see below for any additional details).  Upon hospital discharge, pt would benefit from OP PT (pt reports having OP PT set up for 4/11).   ? ?Recommendations for follow up therapy are one component of a multi-disciplinary discharge planning process, led by the attending physician.  Recommendations may be updated based on patient status, additional functional criteria and insurance authorization. ? ?Follow Up Recommendations Outpatient PT ? ?  ?Assistance Recommended at Discharge Set up Supervision/Assistance  ?Patient can return home with the following ? A little help with bathing/dressing/bathroom;Assistance with cooking/housework;Assist for transportation;Help with stairs or ramp for entrance ? ?  ?Equipment Recommendations None recommended by PT  ?Recommendations for Other Services ? OT consult  ?  ?Functional Status Assessment Patient has had a recent decline in their functional status and demonstrates the ability to make significant improvements in function in a reasonable and predictable amount of time.  ? ?   ?Precautions / Restrictions Precautions ?Precautions: Fall;Shoulder ?Shoulder Interventions: Shoulder sling/immobilizer;Shoulder abduction pillow;Off for dressing/bathing/exercises ?Restrictions ?Weight Bearing Restrictions: Yes ?LUE Weight Bearing: Non weight bearing ?Other Position/Activity Restrictions: NWB on operative extremity. Please instruct on pendulums, passive RoM exercises (for RSA: 90FF, 30ER). Also work on hand, wrist, and elbow RoM.  ? ?  ? ?Mobility ? Bed Mobility ?Overal bed mobility: Modified Independent ?  ?  ?  ?  ?  ?  ?General bed mobility comments: Semi-supine to sitting edge of bed with mild increased effort to perform on own ?  ? ?Transfers ?Overall transfer level: Needs assistance ?Equipment used: None ?Transfers: Sit to/from Stand, Bed to chair/wheelchair/BSC ?Sit to Stand: Supervision ?  ?Step pivot transfers: Supervision ?  ?  ?  ?General transfer comment: SBA for lines; steady ?  ? ?Ambulation/Gait ?Ambulation/Gait assistance: Min guard, Supervision ?Gait Distance (Feet): 270 Feet ?Assistive device: IV Pole ?Gait Pattern/deviations: Step-through pattern ?Gait velocity: mildly decreased ?  ?  ?General Gait Details: pt requesting to use IV pole in hallway for support (but no AD use in room); steady with single UE support ? ?Stairs ?  ?  ?  ?  ?  ? ?Wheelchair Mobility ?  ? ?Modified Rankin (Stroke Patients Only) ?  ? ?  ? ?Balance Overall balance assessment: Needs assistance ?Sitting-balance support: No upper extremity supported, Feet supported ?Sitting balance-Leahy Scale: Good ?Sitting balance - Comments: steady sitting reaching within BOS with R UE ?  ?Standing balance support: Single extremity supported, During functional activity ?Standing balance-Leahy Scale: Good ?Standing balance comment: steady ambulating with light single UE support on IV pole ?  ?  ?  ?  ?  ?  ?  ?  ?  ?  ?  ?   ? ? ? ?  Pertinent Vitals/Pain Pain Assessment ?Pain Assessment: 0-10 ?Pain Score: 2  ?Pain Location:  L lateral neck (chronic pain for last month) ?Pain Descriptors / Indicators: Aching, Sore ?Pain Intervention(s): Limited activity within patient's tolerance, Monitored during session ?Vitals (HR and O2 on room air) stable and WFL throughout treatment session. ?Pt reports chronic low back and hip pain (5/10 beginning of session but pt did not mention this pain end of session).  ? ? ?Home Living Family/patient expects to be discharged to:: Private residence ?Living Arrangements: Spouse/significant other (pt's cat (Hercules)) ?Available Help at Discharge: Family;Available 24 hours/day ?Type of Home: House ?Home Access: Stairs to enter ?Entrance Stairs-Rails: None ?Entrance Stairs-Number of Steps: 1 ?  ?Home Layout: Two level;Able to live on main level with bedroom/bathroom ?Home Equipment: Shower seat - built Medical sales representative (2 wheels);Cane - single point ?   ?  ?Prior Function Prior Level of Function : Independent/Modified Independent ?  ?  ?  ?  ?  ?  ?Mobility Comments: Fall in September 2022 (pt reports reason she needed L shoulder surgery) ?  ?  ? ? ?Hand Dominance  ?   ? ?  ?Extremity/Trunk Assessment  ? Upper Extremity Assessment ?Upper Extremity Assessment: LUE deficits/detail ?LUE Deficits / Details: L UE in shoulder immobilizer ?LUE: Unable to fully assess due to immobilization ?  ? ?Lower Extremity Assessment ?Lower Extremity Assessment: Generalized weakness ?  ? ?Cervical / Trunk Assessment ?Cervical / Trunk Assessment: Normal  ?Communication  ? Communication: No difficulties  ?Cognition Arousal/Alertness: Awake/alert ?Behavior During Therapy: [facility] for tasks assessed/performed ?Overall Cognitive Status: Within Functional Limits for tasks assessed ?  ?  ?  ?  ?  ?  ?  ?  ?  ?  ?  ?  ?  ?  ?  ?  ?  ?  ?  ? ?  ?General Comments  Nursing cleared pt for participation in physical therapy.  Pt agreeable to PT session.  Pt's husband present during session. ? ?  ?Exercises    ? ?Assessment/Plan  ?  ?PT Assessment  Patient needs continued PT services  ?PT Problem List Decreased strength;Decreased balance;Decreased mobility;Decreased knowledge of precautions;Pain;Decreased skin integrity ? ?   ?  ?PT Treatment Interventions DME instruction;Gait training;Stair training;Functional mobility training;Therapeutic activities;Therapeutic exercise;Balance training;Patient/family education   ? ?PT Goals (Current goals can be found in the Care Plan section)  ?Acute Rehab PT Goals ?Patient Stated Goal: to go home ?PT Goal Formulation: With patient ?Time For Goal Achievement: 11/11/21 ?Potential to Achieve Goals: Good ? ?  ?Frequency BID ?  ? ? ?Co-evaluation   ?  ?  ?  ?  ? ? ?  ?AM-PAC PT "6 Clicks" Mobility  ?Outcome Measure Help needed turning from your back to your side while in a flat bed without using bedrails?: None ?Help needed moving from lying on your back to sitting on the side of a flat bed without using bedrails?: None ?Help needed moving to and from a bed to a chair (including a wheelchair)?: A Little ?Help needed standing up from a chair using your arms (e.g., wheelchair or bedside chair)?: A Little ?Help needed to walk in hospital room?: A Little ?Help needed climbing 3-5 steps with a railing? : A Little ?6 Click Score: 20 ? ?  ?End of Session Equipment Utilized During Treatment: Gait belt;Other (comment) (L UE shoulder immobilizer) ?Activity Tolerance: Patient tolerated treatment well ?Patient left: in chair;with call bell/phone within reach;with chair alarm set;with family/visitor present;with SCD's reapplied;Other (  comment) (pillows under L UE for support (pt with shoulder immobilizer/abduction pillow on)) ?Nurse Communication: Mobility status;Precautions;Weight bearing status ?PT Visit Diagnosis: Muscle weakness (generalized) (M62.81);History of falling (Z91.81);Other abnormalities of gait and mobility (R26.89) ?  ? ?Time: 5259-1028 ?PT Time Calculation (min) (ACUTE ONLY): 49 min ? ? ?Charges:   PT Evaluation ?$PT Eval  Low Complexity: 1 Low ?PT Treatments ?$Therapeutic Activity: 23-37 mins ?  ?   ? ?Leitha Bleak, PT ?10/28/21, 4:09 PM ? ? ?

## 2021-10-28 NOTE — Progress Notes (Signed)
Patient's family notified that she will be going to room 147 on the first floor in 15 minutes. ?

## 2021-10-28 NOTE — Anesthesia Postprocedure Evaluation (Signed)
Anesthesia Post Note ? ?Patient: Caitlin Rogers ? ?Procedure(s) Performed: Left reverse shoulder arthroplasty, biceps tenodesis (Left: Shoulder) ?BICEPS TENODESIS (Left: Shoulder) ? ?Patient location during evaluation: PACU ?Anesthesia Type: General ?Level of consciousness: awake and alert ?Pain management: pain level controlled ?Vital Signs Assessment: post-procedure vital signs reviewed and stable ?Respiratory status: spontaneous breathing, nonlabored ventilation, respiratory function stable and patient connected to nasal cannula oxygen ?Cardiovascular status: blood pressure returned to baseline and stable ?Postop Assessment: no apparent nausea or vomiting ?Anesthetic complications: no ? ? ?No notable events documented. ? ? ?Last Vitals:  ?Vitals:  ? 10/28/21 1215 10/28/21 1230  ?BP: (!) 84/46 (!) 90/51  ?Pulse: 78 83  ?Resp: 14 15  ?Temp:    ?SpO2: 93% 92%  ?  ?Last Pain:  ?Vitals:  ? 10/28/21 1230  ?TempSrc:   ?PainSc: Asleep  ? ? ?  ?  ?  ?  ?  ?  ? ?Arita Miss ? ? ? ? ?

## 2021-10-28 NOTE — Anesthesia Procedure Notes (Signed)
Procedure Name: Intubation ?Date/Time: 10/28/2021 8:13 AM ?Performed by: Fredderick Phenix, CRNA ?Pre-anesthesia Checklist: Patient identified, Emergency Drugs available, Suction available and Patient being monitored ?Patient Re-evaluated:Patient Re-evaluated prior to induction ?Oxygen Delivery Method: Circle system utilized ?Preoxygenation: Pre-oxygenation with 100% oxygen ?Induction Type: IV induction ?Ventilation: Mask ventilation without difficulty ?Laryngoscope Size: Mac and 3 ?Grade View: Grade I ?Tube type: Oral ?Tube size: 7.0 mm ?Number of attempts: 1 ?Airway Equipment and Method: Stylet and Oral airway ?Placement Confirmation: ETT inserted through vocal cords under direct vision, positive ETCO2 and breath sounds checked- equal and bilateral ?Secured at: 20 cm ?Tube secured with: Tape ?Dental Injury: Teeth and Oropharynx as per pre-operative assessment  ? ? ? ? ?

## 2021-10-28 NOTE — Anesthesia Procedure Notes (Signed)
Anesthesia Regional Block: Interscalene brachial plexus block   Pre-Anesthetic Checklist: , timeout performed,  Correct Patient, Correct Site, Correct Laterality,  Correct Procedure, Correct Position, site marked,  Risks and benefits discussed,  Surgical consent,  Pre-op evaluation,  At surgeon's request and post-op pain management  Laterality: Left  Prep: chloraprep       Needles:  Injection technique: Single-shot  Needle Type: Echogenic Needle     Needle Length: 4cm  Needle Gauge: 25     Additional Needles:   Procedures:,,,, ultrasound used (permanent image in chart),,    Narrative:  Injection made incrementally with aspirations every 5 mL.  Performed by: Personally  Anesthesiologist: Sumire Halbleib, MD  Additional Notes: Patient's chart reviewed and they were deemed appropriate candidate for procedure, at surgeon's request. Patient educated about risks, benefits, and alternatives of the block including but not limited to: temporary or permanent nerve damage, bleeding, infection, damage to surround tissues, pneumothorax, hemidiaphragmatic paralysis, unilateral Horner's syndrome, block failure, local anesthetic toxicity. Patient expressed understanding. A formal time-out was conducted consistent with institution rules.  Monitors were applied, and minimal sedation used (see nursing record). The site was prepped with skin prep and allowed to dry, and sterile gloves were used. A high frequency linear ultrasound probe with probe cover was utilized throughout. C5-7 nerve roots located and appeared anatomically normal, local anesthetic injected around them, and echogenic block needle trajectory was monitored throughout. Aspiration performed every 5ml. Lung and blood vessels were avoided. All injections were performed without resistance and free of blood and paresthesias. The patient tolerated the procedure well.  Injectate: 10ml exparel + 10ml 0.5% bupivacaine      

## 2021-10-28 NOTE — Discharge Instructions (Signed)
Caitlin Mulligan, MD ? [facility] ? Phone: [phone] ? Fax: [phone] ? ? ?Discharge Instructions after Reverse Shoulder Replacement ? ?  ?1. Activity/Sling: You are to be non-weight bearing on operative extremity. A sling/shoulder immobilizer has been provided for you. Only remove the sling to perform elbow, wrist, and hand RoM exercises and hygiene/dressing. Active reaching and lifting are not permitted. You will be given further instructions on sling use at your first physical therapy visit and postoperative visit with Dr. Posey Pronto.  ? ?2. Dressings: Dressing may be removed at 1st physical therapy visit (~3-4 days after surgery). Afterwards, you may either leave open to air (if no drainage) or cover with dry, sterile dressing. If you have steri-strips on your wound, please do not remove them. They will fall off on their own. You may shower 5 days after surgery. Please pat incision dry. Do not rub or place any shear forces across incision. If there is drainage or any opening of incision after 5 days, please notify our offices immediately.  ?  ?3. Driving:  Plan on not driving for six weeks. Please note that you are advised NOT to drive while taking narcotic pain medications as you may be impaired and unsafe to drive. ?  ?4. Medications:  ?- You have been provided a prescription for narcotic pain medicine (usually oxycodone). After surgery, take 1-2 narcotic tablets every 4 hours if needed for severe pain. Please start this as soon as you begin to start having pain (if you received a nerve block, start taking as soon as this wears off).  ?- A prescription for anti-nausea medication will be provided in case the narcotic medicine causes nausea - take 1 tablet every 6 hours only if nauseated.  ?- Take enteric coated aspirin 325 mg once daily for 6 weeks to prevent blood clots. Do not take aspirin if you have an aspirin sensitivity/allergy or asthma or are on an anticoagulant (blood thinner) already. If so, then  your home anticoagulant will be resume and managed - do not take aspirin. ?-Take tylenol '1000mg'$  (2 Extra strength or 3 regular strength tablets) every 8 hours for pain. This will reduce the amount of narcotic medication needed. May stop tylenol when you are having minimal pain. ?- Take a stool softener (Colace, Dulcolax or Senakot) if you are using narcotic pain medications to help with constipation that is associated with narcotic use. ?- DO NOT take ANY nonsteroidal anti-inflammatory pain medications: ?Advil, Motrin, Ibuprofen, Aleve, Naproxen, or Naprosyn.  ? ?If you are taking prescription medication for anxiety, depression, insomnia, muscle spasm, chronic pain, or for attention deficit disorder you are advised that you are at a higher risk of adverse effects with use of narcotics post-op, including narcotic addiction/dependence, depressed breathing, death. ?If you use non-prescribed substances: alcohol, marijuana, cocaine, heroin, methamphetamines, etc., you are at a higher risk of adverse effects with use of narcotics post-op, including narcotic addiction/dependence, depressed breathing, death. ?You are advised that taking > 50 morphine milligram equivalents (MME) of narcotic pain medication per day results in twice the risk of overdose or death. For your prescription provided: oxycodone 5 mg - taking more than 6 tablets per day after the first few days of surgery. ?  ?5. Physical Therapy: 1-2 times per week for ~12 weeks. Therapy typically starts on post operative Day 3 or 4. You have been provided an order for physical therapy. The therapist will provide home exercises. Please contact our offices if this appointment has not been scheduled.  ?  ?  6. Work: May do light duty/desk job in approximately 2 weeks when off of narcotics, pain is well-controlled, and swelling has decreased if able to function with one arm in sling. Full work may take 6 weeks if light motions and function of both arms is required.  Lifting jobs may require 12 weeks. ?  ?7. Post-Op Appointments: ?Your first post-op appointment will be with Dr. Posey Pronto in approximately 2 weeks time.  ?  ?If you find that they have not been scheduled please call the Orthopaedic Appointment front desk at [phone]. ? ? ? ? ? ? ? ? ? ? ? ? ? ? ? ? ? ? ? ? ? ? ? ? ? ? ? ? ? ? ?Caitlin Mulligan, MD ?[facility] ?Phone: [phone] ?Fax: [phone] ? ? ?REVERSE SHOULDER ARTHROPLASTY REHAB GUIDELINES ? ? ?These guidelines should be tailored to individual patients based on their rehab goals, age, precautions, quality of repair, etc.  Progression should be based on patient progress and approval by the referring physician. ? ?PHASE 1 - Day 1 through Week 2 ? ?GENERAL GUIDELINES AND PRECAUTIONS ?Sling wear 24/7 except during grooming and home exercises (3 to 5 times daily) ?Avoid shoulder extension such that the arm is posterior the frontal plane.  When patients recline, a pillow should be placed behind the upper arm and sling should be on.  They should be advised to always be able to see the elbow ?Avoid combined IR/ADD/EXT, such as hand behind back to prevent dislocation ?Avoid combined IR and ADD such as reaching across the chest to prevent dislocation ?No AROM ?No submersion in pool/water for 4 weeks ?No weight bearing through operative arm (as in transfers, walker use, etc?) ? ?GOALS ?Maintain integrity of joint replacement; protect soft tissue healing ?Increase PROM for elevation to 120 and ER to 30 (will remain the goal for first 6 weeks) ?Optimize distal UE circulation and muscle activity (elbow, wrist and hand) ?Instruct in use of sling for proper fit, polar care device for ice application after HEP, signs/symptoms of infection ? ?EXERCISES ?Active elbow, wrist and hand ?Passive forward elevation in scapular plane to 90-120 max motion; ER in scapular plane to 30 ?Active scapular retraction with arms resting in neutral position ? ?CRITERIA TO PROGRESS TO  PHASE 2 ?Low pain (less than 3/10) with shoulder PROM ?Healing of incision without signs of infection ?Clearance by MD to advance after 2 week MD check up ? ?PHASE 2 - 2 weeks - 6 weeks ? ?GENERAL GUIDELINES AND PRECAUTIONS ?Sling may be removed while at home; worn in community without abduction pillow ?May use arm for light activities of daily living (such as feeding, brushing teeth, dressing?) with elbow near  the side of the body  and arm in front of the body- no active lifting of the arm ?May submerge in water (tub, pool, [place], etc?) after 4 weeks ?Continue to avoid WBing through the operative arm ?Continue to avoid combined IR/EXT/ADD (hand behind the back) and IR/ADD  (reaching across chest) for dislocation precautions ? ?GOALS ? Achieve passive elevation to 120 and ER to 30 ? Low (less than 3/10) to no pain ? Ability to fire all heads of the deltoid ? ?EXERCISES ?May discontinue grip, and active elbow and wrist exercises since using the arm in ADL?s  with sling removed around the home ?Continue passive elevation to 120 and ER to 30, both in scapular plane with arm supported on table top ?Add submaximal isometrics, pain free effort, for all  functional heads of deltoid (anterior, posterior, middle)  Ensure that with posterior deltoid isometric the shoulder does not move into extension and the arm remains anterior the frontal plane ?At 4 weeks:  begin to place arm in balanced position of 90 deg elevation in supine; when patient able to hold this position with ease, may begin reverse pendulums clockwise and counterclockwise ? ?CRITERIA TO PROGRESS TO PHASE 3 ?Passive forward elevation in scapular plane to 120; passive ER in scapular plane to 30 ?Ability to fire isometrically all heads of the deltoid muscle without pain ?Ability to place and hold the arm in balanced position (90 deg elevation in supine) ? ?PHASE 3 - 6 weeks to 3 months ? ?GENERAL GUIDELINES AND PRECAUTIONS ?Discontinue use of sling ?Avoid  forcing end range motion in any direction to prevent dislocation  ?May advance use of the arm actively in ADL?s without being restricted to arm by the side of the body, however, avoid heavy lifting and sports (forev

## 2021-10-28 NOTE — Evaluation (Signed)
Occupational Therapy Evaluation ?Patient Details ?Name: Caitlin Rogers ?MRN: [id number] ?DOB: Jun 19, 1956 ?Today's Date: 10/28/2021 ? ? ?History of Present Illness Pt is a 66 y.o. female s/p L reverse total shoulder arthroplasty and L biceps tenodesis secondary to rotator cuff retear (s/p interscalene brachial plexus block) 10/28/21.  PMH includes htn, CAD, anxiety, cervical radiculopathy, COVID-19, and dermatitis.  ? ?Clinical Impression ?  ?Ms [place] was seen for OT evaluation this date. Prior to hospital admission, pt was requiring assist for ADLs 2/2 recent RSA. Pt lives with husband in home c 1 STE. Pt presents to acute OT demonstrating impaired ADL performance and functional mobility 2/2 decreased activity tolerance and impaired use of nondominant LUE. Pt currently requires SETUP + SUPERVISION don pants/underwear. MOD A don/doff sling and polar care. SUPERVISION toilet t/f. All education complete, no skilled acute OT needs identified. Will sign off. Upon hospital discharge, recommend no OT follow up.    ? ?Recommendations for follow up therapy are one component of a multi-disciplinary discharge planning process, led by the attending physician.  Recommendations may be updated based on patient status, additional functional criteria and insurance authorization.  ? ?Follow Up Recommendations ? No OT follow up  ?  ?Assistance Recommended at Discharge Intermittent Supervision/Assistance  ?Patient can return home with the following A lot of help with bathing/dressing/bathroom ? ?  ?Functional Status Assessment ? Patient has had a recent decline in their functional status and demonstrates the ability to make significant improvements in function in a reasonable and predictable amount of time.  ?Equipment Recommendations ? None recommended by OT  ?  ?Recommendations for Other Services   ? ? ?  ?Precautions / Restrictions Precautions ?Precautions: Fall;Shoulder ?Shoulder Interventions: Shoulder sling/immobilizer;Shoulder  abduction pillow;Off for dressing/bathing/exercises ?Restrictions ?Weight Bearing Restrictions: Yes ?LUE Weight Bearing: Non weight bearing ?Other Position/Activity Restrictions: NWB on operative extremity. Please instruct on pendulums, passive RoM exercises (for RSA: 90FF, 30ER). Also work on hand, wrist, and elbow RoM.  ? ?  ? ?Mobility Bed Mobility ?  ?  ?  ?  ?  ?  ?  ?General bed mobility comments: received and left in chair ?  ? ?Transfers ?Overall transfer level: Needs assistance ?Equipment used: None ?Transfers: Sit to/from Stand ?Sit to Stand: Supervision ?  ?  ?  ?  ?  ?  ?  ? ?  ?Balance Overall balance assessment: Needs assistance ?Sitting-balance support: No upper extremity supported, Feet supported ?Sitting balance-Leahy Scale: Normal ?  ?  ?Standing balance support: No upper extremity supported, During functional activity ?Standing balance-Leahy Scale: Good ?  ?  ?  ?  ?  ?  ?  ?  ?  ?  ?  ?  ?   ? ?ADL either performed or assessed with clinical judgement  ? ?ADL Overall ADL's : Needs assistance/impaired ?  ?  ?  ?  ?  ?  ?  ?  ?  ?  ?  ?  ?  ?  ?  ?  ?  ?  ?  ?General ADL Comments: SETUP + SUPERVISION don pants/underwear. MOD A don/doff sling and polar care. SUPERVISION toilet t/f  ? ? ? ? ?Pertinent Vitals/Pain Pain Assessment ?Pain Assessment: 0-10 ?Pain Score: 4  ?Pain Location: L lateral neck (chronic pain for last month) ?Pain Descriptors / Indicators: Aching, Sore ?Pain Intervention(s): Limited activity within patient's tolerance, Repositioned  ? ? ? ?Hand Dominance Right ?  ?Extremity/Trunk Assessment Upper Extremity Assessment ?Upper Extremity Assessment: LUE deficits/detail ?LUE Deficits /  Details: L UE in shoulder immobilizer ?LUE: Unable to fully assess due to immobilization ?  ?Lower Extremity Assessment ?Lower Extremity Assessment: Overall WFL for tasks assessed ?  ?Cervical / Trunk Assessment ?Cervical / Trunk Assessment: Normal ?  ?Communication Communication ?Communication: No  difficulties ?  ?Cognition Arousal/Alertness: Awake/alert ?Behavior During Therapy: [facility] for tasks assessed/performed ?Overall Cognitive Status: Within Functional Limits for tasks assessed ?  ?  ?  ?  ?  ?  ?  ?  ?  ?  ?  ?  ?  ?  ?  ?  ?  ?  ?  ?   ?   ?   ? ? ?Home Living Family/patient expects to be discharged to:: Private residence ?Living Arrangements: Spouse/significant other (pt's cat (Hercules)) ?Available Help at Discharge: Family;Available 24 hours/day ?Type of Home: House ?Home Access: Stairs to enter ?Entrance Stairs-Number of Steps: 1 ?Entrance Stairs-Rails: None ?Home Layout: Two level;Able to live on main level with bedroom/bathroom ?  ?  ?Bathroom Shower/Tub: Walk-in shower ?  ?Bathroom Toilet: Standard ?  ?  ?Home Equipment: Shower seat - built Medical sales representative (2 wheels);Cane - single point ?  ?  ?  ? ?  ?Prior Functioning/Environment Prior Level of Function : Independent/Modified Independent ?  ?  ?  ?  ?  ?  ?Mobility Comments: Fall in September 2022 (pt reports reason she needed L shoulder surgery) ?  ?  ? ?  ?  ?OT Problem List: Decreased activity tolerance;Impaired balance (sitting and/or standing) ?  ?   ?   ?OT Goals(Current goals can be found in the care plan section) Acute Rehab OT Goals ?Patient Stated Goal: to go home ?OT Goal Formulation: With patient/family ?Time For Goal Achievement: 11/11/21 ?Potential to Achieve Goals: Good  ? ?AM-PAC OT "6 Clicks" Daily Activity     ?Outcome Measure Help from another person eating meals?: None ?Help from another person taking care of personal grooming?: A Little ?Help from another person toileting, which includes using toliet, bedpan, or urinal?: A Little ?Help from another person bathing (including washing, rinsing, drying)?: None ?Help from another person to put on and taking off regular upper body clothing?: A Lot ?Help from another person to put on and taking off regular lower body clothing?: A Little ?6 Click Score: 19 ?  ?End of Session    ? ?Activity Tolerance: Patient tolerated treatment well ?Patient left: in chair;with call bell/phone within reach;with family/visitor present ? ?OT Visit Diagnosis: Unsteadiness on feet (R26.81)  ?              ?Time: 3664-4034 ?OT Time Calculation (min): 27 min ?Charges:  OT General Charges ?$OT Visit: 1 Visit ?OT Evaluation ?$OT Eval Low Complexity: 1 Low ?OT Treatments ?$Self Care/Home Management : 23-37 mins ? ?Dessie Coma, M.S. OTR/L  ?10/28/21, 4:35 PM  ?ascom [phone] ? ?

## 2021-10-28 NOTE — Op Note (Addendum)
SURGERY DATE: 10/28/2021 ?  ?PRE-OP DIAGNOSIS:  ?1. Left shoulder rotator cuff retear ?  ?POST-OP DIAGNOSIS:  ?1. Left shoulder rotator cuff retear ?  ?PROCEDURES:  ?1. Left reverse total shoulder arthroplasty ?2. Left biceps tenodesis ?  ?SURGEON: Cato Mulligan, MD ? ?ASSISTANTS: Reche Dixon, PA; Turner Daniels, PA-S  ?  ?ANESTHESIA: Gen + interscalene block ?  ?ESTIMATED BLOOD LOSS: 300cc ?  ?TOTAL IV FLUIDS: per anesthesia record ? ?IMPLANTS: ?D[facility] Surgical: RSP Glenoid Head w/Retaining screw 32-4; Monoblock Reverse Shoulder Baseplate with 4.1LK central screw; 3 locking screws into baseplate (superior, posterior, inferior); Small Shell Short Humeral Stem 10 x 48m; Neutral Small Socket Insert;  ?  ?INDICATION(S):  STinea Nobileis a 66y.o. female who initially underwent left rotator cuff repair of a large tear involving the subscapularis, supraspinatus, and infraspinatus on 07/01/2021 by me.  She had persistent limitation with raising the arm overhead despite appropriate rehabilitation.  Postoperative MRI showed retear of all 3 repaired rotator cuff tendons.  There was also a focus of avascular necrosis in the humeral head.  After discussion of risks, benefits, and alternatives to surgery, the patient elected to proceed with reverse shoulder arthroplasty and biceps tenodesis. ?  ?OPERATIVE FINDINGS: massive rotator cuff tear (complete supraspinatus, partial infraspinatus, complete subscapularis) ?  ?OPERATIVE REPORT:   ?I identified STavi Gaughranin the pre-operative holding area. Informed consent was obtained and the surgical site was marked. I reviewed the risks and benefits of the proposed surgical intervention and the patient (and/or patient's guardian) wished to proceed. An interscalene block with Exparel was administered by the Anesthesia team. The patient was transferred to the operative suite and general anesthesia was administered. The patient was placed in the beach chair position with the head of the bed  elevated approximately 45 degrees. All down side pressure points were appropriately padded. Pre-op exam under anesthesia confirmed some stiffness and crepitus. Appropriate IV antibiotics were administered. The extremity was then prepped and draped in standard fashion. A time out was performed confirming the correct extremity, correct patient, and correct procedure.  ? ?We used the standard deltopectoral incision from the coracoid to ~12cm distal. We found the cephalic vein and took it laterally. We opened the deltopectoral interval widely and placed retractors under the CA ligament in the subacromial space and under the deltoid tendon at its insertion. We then abducted and internally rotated the arm and released the underlying bursa between these retractors, taking care not to damage the circumflex branch of the axillary nerve.  ? ?Next, we brought the arm back in adduction at slight forward flexion with external rotation. We opened the clavipectoral fascia lateral to the conjoint tendon. We gently palpated the axillary nerve and verified its position and continuity on both sides of the humerus with a Tug test. This test was repeated multiple times during the procedure for nerve localization and confirmed to be intact at the end of the case. We then cauterized the anterior humeral circumflex (?Three sisters?) vessels. The arm was then internally rotated, we cut the falciform ligament at approximately 1 cm of the upper portion of the pectoralis major insertion. Next we unroofed the bicipital groove. We proceeded with a soft tissue biceps tenodesis given the pathology (significant thickening and tendinopathy proximally) of the tendon.  We performed a biceps tenodesis with two #2 TiCron sutures to the upper border of the pectoralis major. The proximal portion of the tendon was excised.  ? ?At this point, we could see that the  supraspinatus was completely torn with a bald humeral head superiorly. The subscapularis was  also completely torn. We removed the anterior capsule tissue off of the humeral head. We released the inferior capsule from the humerus all the way to the posterior band of the inferior glenohumeral ligament. When this was complete we gently dislocated the shoulder up into the wound. We removed any osteophytes and made our cut with the appropriate inclination in 30 degrees of retroversion.  Previously placed suture from rotator cuff repair that was encountered was removed. ? ?We then turned our attention back to the glenoid. The proximal humerus was retracted posteriorly. The anterior capsule was then excised, exposing the anterior glenoid. We then grasped the labrum and removed it circumferentially. During the glenoid exposure, the axillary nerve was protected the entire time.   ? ?A patient-specific guide was used to drill the central guidepin. A tap was placed, matching the trajectory of the guidepin. An appropriately sized reamer was used to ream the glenoid. The monoblock baseplate was inserted and excellent fixation was achieved such that the entire scapula rotated with further attempted seating of the baseplate. The 3 peripheral screws were drilled, measured, and placed. A 32-4 glenosphere was then placed and tightened.  ? ?We then turned our attention back to the humerus. We sized for a small shell prosthesis. We sequentially used larger diameter canal finders until we met significant resistance and sequential broaching was performed to this size listed above.  Further suture and BioComposite anchors from prior repair were removed.  The humerus was trialed and noted to have satisfactory stability, motion, and deltoid tension with a 0 poly. The trial implants were removed. Next, the implant was placed with the appropriate retroversion. Stability was confirmed. We placed an 0 poly. The humerus was reduced and motion, tension, and stability were satisfactory. A Hemovac drain was placed.  ? ?We again verified  the tension on the axillary nerve, appropriate range of motion, stability of the implant, We closed the deltopectoral interval deep to the cephalic vein with a running, 0-Vicryl suture. The skin was closed with 2-0 Vicryl and 4-0 Monocryl with Dermabond. Xeroform and Honeycomb dressing was applied. A PolarCare unit and sling were placed. Patient was extubated, transferred to a stretcher bed and to the post antesthesia care unit in stable condition.  ? ?Of note, assistance from a PA was essential to performing the surgery.  PA was present for the entire surgery.  PA assisted with patient positioning, retraction, instrumentation, and wound closure. The surgery would have been more difficult and had longer operative time without PA assistance.  ? ? ?POSTOPERATIVE PLAN: ?The patient will be admitted with plan for discharge home on POD#1. Operative arm to remain in sling at all times except RoM exercises and hygiene. Can perform pendulums, elbow/wrist/hand RoM exercises. Passive RoM allowed to 90 FF and 30 ER. ASA 366m x 6 weeks for DVT ppx. Plan for PT starting on POD #3-4. Patient to return to clinic in ~2 weeks for post-operative appointment.  ? ?

## 2021-10-29 DIAGNOSIS — I10 Essential (primary) hypertension: Secondary | ICD-10-CM | POA: Diagnosis not present

## 2021-10-29 DIAGNOSIS — M75102 Unspecified rotator cuff tear or rupture of left shoulder, not specified as traumatic: Secondary | ICD-10-CM | POA: Diagnosis not present

## 2021-10-29 DIAGNOSIS — Z79899 Other long term (current) drug therapy: Secondary | ICD-10-CM | POA: Diagnosis not present

## 2021-10-29 DIAGNOSIS — Z8616 Personal history of COVID-19: Secondary | ICD-10-CM | POA: Diagnosis not present

## 2021-10-29 LAB — BASIC METABOLIC PANEL
Anion gap: 8 (ref 5–15)
BUN: 19 mg/dL (ref 8–23)
CO2: 26 mmol/L (ref 22–32)
Calcium: 9.3 mg/dL (ref 8.9–10.3)
Chloride: 103 mmol/L (ref 98–111)
Creatinine, Ser: 0.86 mg/dL (ref 0.44–1.00)
GFR, Estimated: >60 mL/min (ref >60)
Glucose, Bld: 132 mg/dL — ABNORMAL HIGH (ref 70–99)
Potassium: 3.6 mmol/L (ref 3.5–5.1)
Sodium: 137 mmol/L (ref 135–145)

## 2021-10-29 LAB — CBC
HCT: 31.7 % — ABNORMAL LOW (ref 36.0–46.0)
Hemoglobin: 10.3 g/dL — ABNORMAL LOW (ref 12.0–15.0)
MCH: 29.9 pg (ref 26.0–34.0)
MCHC: 32.5 g/dL (ref 30.0–36.0)
MCV: 92.2 fL (ref 80.0–100.0)
Platelets: 211 10*3/uL (ref 150–400)
RBC: 3.44 MIL/uL — ABNORMAL LOW (ref 3.87–5.11)
RDW: 12.8 % (ref 11.5–15.5)
WBC: 8.8 10*3/uL (ref 4.0–10.5)
nRBC: 0 % (ref 0.0–0.2)

## 2021-10-29 MED ORDER — ASPIRIN 325 MG PO TBEC: 325.0000 mg | DELAYED_RELEASE_TABLET | Freq: Every day | ORAL | 0 refills | Status: DC

## 2021-10-29 MED ORDER — OXYCODONE HCL 5 MG PO TABS: 5.0000 mg | ORAL_TABLET | ORAL | 0 refills | Status: DC | PRN

## 2021-10-29 NOTE — Progress Notes (Signed)
Physical Therapy Treatment ?Patient Details ?Name: Caitlin Rogers ?MRN: [id number] ?DOB: May 07, 1956 ?Today's Date: 10/29/2021 ? ? ?History of Present Illness Pt is a 66 y.o. female s/p L reverse total shoulder arthroplasty and L biceps tenodesis secondary to rotator cuff retear (s/p interscalene brachial plexus block) 10/28/21.  PMH includes htn, CAD, anxiety, cervical radiculopathy, COVID-19, and dermatitis. ? ?  ?PT Comments  ? ? Pt resting in recliner upon PT arrival; pt agreeable to PT session.  During session pt modified independent with bed mobility; independent with transfers; SBA ambulating 320 feet (no AD use); and CGA navigating 4 steps with single UE support.  Pt steady and safe with all functional mobility during sessions activities.  Reviewed home safety and safe car transfers: pt verbalizing appropriate understanding.  Pt appears safe to discharge home when medically appropriate (plan to start OP PT 4/11). ?  ?Recommendations for follow up therapy are one component of a multi-disciplinary discharge planning process, led by the attending physician.  Recommendations may be updated based on patient status, additional functional criteria and insurance authorization. ? ?Follow Up Recommendations ? Outpatient PT ?  ?  ?Assistance Recommended at Discharge Set up Supervision/Assistance  ?Patient can return home with the following A little help with bathing/dressing/bathroom;Assistance with cooking/housework;Assist for transportation;Help with stairs or ramp for entrance ?  ?Equipment Recommendations ? None recommended by PT  ?  ?Recommendations for Other Services OT consult ? ? ?  ?Precautions / Restrictions Precautions ?Precautions: Fall;Shoulder ?Shoulder Interventions: Shoulder sling/immobilizer;Shoulder abduction pillow;Off for dressing/bathing/exercises ?Restrictions ?Weight Bearing Restrictions: Yes ?LUE Weight Bearing: Non weight bearing ?Other Position/Activity Restrictions: NWB on operative extremity.  Please instruct on pendulums, passive RoM exercises (for RSA: 90FF, 30ER). Also work on hand, wrist, and elbow RoM.  ?  ? ?Mobility ? Bed Mobility ?Overal bed mobility: Modified Independent ?  ?  ?  ?  ?  ?  ?General bed mobility comments: Semi-supine to/from sitting edge of bed (pt has adjustable bed at home) ?  ? ?Transfers ?Overall transfer level: Independent ?Equipment used: None ?Transfers: Sit to/from Stand, Bed to chair/wheelchair/BSC ?Sit to Stand: Independent ?  ?Step pivot transfers: Independent ?  ?  ?  ?General transfer comment: steady safe transfers noted ?  ? ?Ambulation/Gait ?Ambulation/Gait assistance: Supervision ?Gait Distance (Feet): 320 Feet ?Assistive device: None ?Gait Pattern/deviations: WFL(Within Functional Limits) ?Gait velocity: mildly decreased ?  ?  ?General Gait Details: steady ambulation ? ? ?Stairs ?Stairs: Yes ?Stairs assistance: Min guard (R hand hold assist ascending; R hand on therapists shoulder descending) ?Stair Management: Alternating pattern, Forwards ?Number of Stairs: 4 ?General stair comments: steady safe stairs navigation ? ? ?Wheelchair Mobility ?  ? ?Modified Rankin (Stroke Patients Only) ?  ? ? ?  ?Balance Overall balance assessment: Needs assistance ?Sitting-balance support: No upper extremity supported, Feet supported ?Sitting balance-Leahy Scale: Normal ?Sitting balance - Comments: steady sitting reaching outside BOS with R UE ?  ?Standing balance support: No upper extremity supported, During functional activity ?Standing balance-Leahy Scale: Normal ?Standing balance comment: steady ambulating without UE support ?  ?  ?  ?  ?  ?  ?  ?  ?  ?  ?  ?  ? ?  ?Cognition Arousal/Alertness: Awake/alert ?Behavior During Therapy: [facility] for tasks assessed/performed ?Overall Cognitive Status: Within Functional Limits for tasks assessed ?  ?  ?  ?  ?  ?  ?  ?  ?  ?  ?  ?  ?  ?  ?  ?  ?  ?  ?  ? ?  ?  Exercises   ? ?  ?General Comments  Nursing cleared pt for participation in physical  therapy.  Pt agreeable to PT session. ?  ?  ? ?Pertinent Vitals/Pain Pain Assessment ?Pain Assessment: Faces ?Faces Pain Scale: Hurts a little bit ?Pain Location: L lateral neck (chronic pain for last month) ?Pain Descriptors / Indicators: Aching, Sore ?Pain Intervention(s): Limited activity within patient's tolerance, Monitored during session, Repositioned, Other (comment) (polar care applied) ?Vitals (HR and O2 on room air) stable and WFL throughout treatment session.  ? ? ?Home Living   ?  ?  ?  ?  ?  ?  ?  ?  ?  ?   ?  ?Prior Function    ?  ?  ?   ? ?PT Goals (current goals can now be found in the care plan section) Acute Rehab PT Goals ?Patient Stated Goal: to go home ?PT Goal Formulation: With patient ?Time For Goal Achievement: 11/11/21 ?Potential to Achieve Goals: Good ?Progress towards PT goals: Progressing toward goals ? ?  ?Frequency ? ? ? BID ? ? ? ?  ?PT Plan Current plan remains appropriate  ? ? ?Co-evaluation   ?  ?  ?  ?  ? ?  ?AM-PAC PT "6 Clicks" Mobility   ?Outcome Measure ? Help needed turning from your back to your side while in a flat bed without using bedrails?: None ?Help needed moving from lying on your back to sitting on the side of a flat bed without using bedrails?: None ?Help needed moving to and from a bed to a chair (including a wheelchair)?: None ?Help needed standing up from a chair using your arms (e.g., wheelchair or bedside chair)?: None ?Help needed to walk in hospital room?: A Little ?Help needed climbing 3-5 steps with a railing? : A Little ?6 Click Score: 22 ? ?  ?End of Session Equipment Utilized During Treatment: Gait belt;Other (comment) (L UE shoulder immobilizer) ?Activity Tolerance: Patient tolerated treatment well ?Patient left: in chair;with call bell/phone within reach;Other (comment) (nurse cleared pt for no chair alarm) ?Nurse Communication: Mobility status;Precautions;Weight bearing status ?PT Visit Diagnosis: Muscle weakness (generalized) (M62.81);History of  falling (Z91.81);Other abnormalities of gait and mobility (R26.89) ?  ? ? ?Time: 5852-7782 ?PT Time Calculation (min) (ACUTE ONLY): 24 min ? ?Charges:  $Gait Training: 8-22 mins ?$Therapeutic Activity: 8-22 mins          ?          ?Leitha Bleak, PT ?10/29/21, 9:22 AM ? ? ?

## 2021-10-29 NOTE — Progress Notes (Signed)
?  Subjective: ?1 Day Post-Op Procedure(s) (LRB): ?Left reverse shoulder arthroplasty, biceps tenodesis (Left) ?BICEPS TENODESIS (Left) ?Patient reports pain as moderate.   ?Patient is well, and has had no acute complaints or problems ?Plan is to go Home after hospital stay. ?Negative for chest pain and shortness of breath ?Fever: no ?Gastrointestinal: negative for nausea and vomiting.   ? ?Objective: ?Vital signs in last 24 hours: ?Temp:  [97.4 ?F (36.3 ?C)-98 ?F (36.7 ?C)] 97.7 ?F (36.5 ?C) (04/08 0747) ?Pulse Rate:  [64-102] 73 (04/08 0747) ?Resp:  [11-16] 16 (04/08 0747) ?BP: (84-126)/(46-88) 115/61 (04/08 0747) ?SpO2:  [91 %-100 %] 100 % (04/08 0747) ? ?Intake/Output from previous day: ? ?Intake/Output Summary (Last 24 hours) at 10/29/2021 1019 ?Last data filed at 10/28/2021 1439 ?Gross per 24 hour  ?Intake 1670 ml  ?Output 190 ml  ?Net 1480 ml  ?  ?Intake/Output this shift: ?No intake/output data recorded. ? ?Labs: ?Recent Labs  ?  10/29/21 ?6387  ?HGB 10.3*  ? ?Recent Labs  ?  10/29/21 ?5643  ?WBC 8.8  ?RBC 3.44*  ?HCT 31.7*  ?PLT 211  ? ?Recent Labs  ?  10/29/21 ?3295  ?NA 137  ?K 3.6  ?CL 103  ?CO2 26  ?BUN 19  ?CREATININE 0.86  ?GLUCOSE 132*  ?CALCIUM 9.3  ? ?No results for input(s): LABPT, INR in the last 72 hours. ? ? ?EXAM ?General - Patient is Alert, Appropriate, and Oriented ?Extremity -able to move fingers and wrist, though weakness with finger and wrist extension, sensation decreased to light touch over the arm and forearm, intact over axillary distribution; radial pulse 2+ ?Dressing/Incision -clean, dry, no drainage, Hemovac in place.  ? ? ? ? ?Assessment/Plan: ?1 Day Post-Op Procedure(s) (LRB): ?Left reverse shoulder arthroplasty, biceps tenodesis (Left) ?BICEPS TENODESIS (Left) ?Principal Problem: ?  Rotator cuff tear ? ?Estimated body mass index is 29.76 kg/m? as calculated from the following: ?  Height as of this encounter: '5\' 3"'$  (1.6 m). ?  Weight as of this encounter: 76.2 kg. ? ?Hemovac drain  removed. Mini compression dressing applied.  ? ?Patient to d/c home.  ? ? ? ?DVT Prophylaxis - ASA 325 qd x 6 weeks, will f/u in 2 weeks at [facility] for staple removal ?NonWeight-Bearing to LUE, no AROM to LUE. Patient to remain in sling except for therapy.  ? ?Cassell Smiles, PA-C ?[facility] ?10/29/2021, 10:19 AM ? ?

## 2021-10-29 NOTE — Discharge Summary (Signed)
?Physician Discharge Summary  ?Patient ID: ?Caitlin Rogers ?MRN: [id number] ?DOB/AGE: 65/65/1957 65 y.o. ? ?Admit date: 10/28/2021 ?Discharge date: 10/29/2021 ? ?Admission Diagnoses:  ?Rotator cuff tear [M75.100] ? ?Surgeries: ?PROCEDURES:  ?1. Left reverse total shoulder arthroplasty ?2. Left biceps tenodesis ?  ?SURGEON: Cato Mulligan, MD ?  ?ASSISTANTS: Reche Dixon, PA; Turner Daniels, PA-S  ?  ?ANESTHESIA: Gen + interscalene block ?  ?ESTIMATED BLOOD LOSS: 300cc ?  ?TOTAL IV FLUIDS: per anesthesia record ?  ?IMPLANTS: ?[facility] [employer]: RSP Glenoid Head w/Retaining screw 32-4; Monoblock Reverse Shoulder Baseplate with 7.7OE central screw; 3 locking screws into baseplate (superior, posterior, inferior); Small Shell Short Humeral Stem 10 x 36m; Neutral Small Socket Insert;  ?  ? ?Discharge Diagnoses: ?Patient Active Problem List  ? Diagnosis Date Noted  ? Rotator cuff tear 10/28/2021  ? Right foot pain 10/26/2021  ? Left hip pain 05/23/2021  ? Dermatitis 11/01/2020  ? Coronary artery calcification 10/07/2020  ? Aortic atherosclerosis ([facility] 09/29/2020  ? Hypertension   ? Ringing in the ears   ? Depression, major, single episode, complete remission ([facility] 07/06/2020  ? Anxiety   ? Arthritis   ? Hyperlipemia   ? Cervical radiculopathy 02/13/2020  ? Chest discomfort 01/19/2020  ? Cardiac murmur 01/19/2020  ? Other spondylosis with myelopathy, cervical region 01/19/2020  ? Numbness 01/02/2020  ? COVID-19 06/2019  ? Essential hypertension 11/11/2016  ? Hyperlipidemia 11/11/2016  ? GERD (gastroesophageal reflux disease) 11/11/2016  ? Palpitations 11/11/2016  ? Atypical chest pain 11/11/2016  ? ? ?Past Medical History:  ?Diagnosis Date  ? Anxiety   ? Aortic atherosclerosis ([facility] 09/29/2020  ? On coronary CT   ? Arthritis   ? Atypical chest pain 11/11/2016  ? Cardiac murmur 01/19/2020  ? Cervical radiculopathy 02/13/2020  ? Chest discomfort 01/19/2020  ? Coronary artery calcification 10/07/2020  ? COVID-19 06/2019  ? Current moderate  episode of major depressive disorder without prior episode ([facility] 07/06/2020  ? Dermatitis 11/01/2020  ? Essential hypertension 11/11/2016  ? GERD (gastroesophageal reflux disease) 11/11/2016  ? Hyperlipemia   ? Hyperlipidemia 11/11/2016  ? Hypertension   ? Numbness 01/02/2020  ? Other spondylosis with myelopathy, cervical region 01/19/2020  ? Palpitations 11/11/2016  ? Ringing in the ears   ? post covid symptom  ? ?  ?Transfusion:  ?  ?Consultants (if any):  ? ?Discharged Condition: Improved ? ?Hospital Course: SJaneliz Prestwoodis an 66y.o. female who was admitted 10/28/2021 with a diagnosis of recurrent left rotator cuff tear and went to the operating room on 10/28/2021 and underwent left reverse total shoulder arthroplasty with biceps tenodesis. The patient received perioperative antibiotics for prophylaxis (see below). The patient tolerated the procedure well and was transported to PACU in stable condition. After meeting PACU criteria, the patient was subsequently transferred to the Orthopaedics/Rehabilitation unit.  ? ?The patient received DVT prophylaxis in the form of early mobilization,  ASA '325mg'$  . Wound drains were discontinued on postoperative day #1. The surgical incision was healing well without signs of infection. ? ?Physical therapy was initiated postoperatively for transfers, gait training, and strengthening. Occupational therapy was initiated for activities of daily living and evaluation for assisted devices. Rehabilitation goals were reviewed in detail with the patient. The patient made steady progress with physical therapy and physical therapy recommended discharge to Home.  ? ?The patient achieved the preliminary goals of this hospitalization and was felt to be medically and orthopaedically appropriate for discharge. ? ?She was given perioperative antibiotics:  ?Anti-infectives (  From admission, onward)  ? ? Start     Dose/Rate Route Frequency Ordered Stop  ? 10/28/21 1400  ceFAZolin (ANCEF) IVPB 2g/100  mL premix       ? 2 g ?200 mL/hr over 30 Minutes Intravenous Every 6 hours 10/28/21 1302 10/29/21 0310  ? 10/28/21 1033  vancomycin (VANCOCIN) powder  Status:  Discontinued       ?   As needed 10/28/21 1034 10/28/21 1114  ? 10/28/21 0625  ceFAZolin (ANCEF) 2-4 GM/100ML-% IVPB       ?Note to Pharmacy: Maryagnes Amos B: cabinet override  ?    10/28/21 0625 10/28/21 1509  ? 10/28/21 0615  ceFAZolin (ANCEF) IVPB 2g/100 mL premix       ? 2 g ?200 mL/hr over 30 Minutes Intravenous On call to O.R. 10/28/21 8101 10/28/21 0818  ? ?  ?. ? ?Recent vital signs:  ?Vitals:  ? 10/29/21 0546 10/29/21 0747  ?BP: 112/88 115/61  ?Pulse: 82 73  ?Resp: 16 16  ?Temp: 98 ?F (36.7 ?C) 97.7 ?F (36.5 ?C)  ?SpO2: 100% 100%  ? ? ?Recent laboratory studies:  ?Recent Labs  ?  10/29/21 ?7510  ?WBC 8.8  ?HGB 10.3*  ?HCT 31.7*  ?PLT 211  ?K 3.6  ?CL 103  ?CO2 26  ?BUN 19  ?CREATININE 0.86  ?GLUCOSE 132*  ?CALCIUM 9.3  ? ? ?Diagnostic Studies: CT SHOULDER LEFT WO CONTRAST ? ?Result Date: 10/11/2021 ?CLINICAL DATA:  Continued left shoulder pain and limited range of motion despite rotator cuff repair last December. Recurrent full-thickness rotator cuff tear seen on recent MRI. EXAM: CT OF THE UPPER LEFT EXTREMITY WITHOUT CONTRAST TECHNIQUE: Multidetector CT imaging of the upper left extremity was performed according to the standard protocol. RADIATION DOSE REDUCTION: This exam was performed according to the departmental dose-optimization program which includes automated exposure control, adjustment of the mA and/or kV according to patient size and/or use of iterative reconstruction technique. COMPARISON:  MRI left shoulder dated October 07, 2021. FINDINGS: Bones/Joint/Cartilage No fracture or dislocation. High-riding humeral head with anterior subluxation. The humeral head abuts the coracoid process. Multiple suture anchor tracts in the greater tuberosity and bicipital groove. Patchy sclerosis in the humeral head consistent with avascular necrosis. No  subchondral collapse. Small glenohumeral marginal osteophytes. Unchanged small glenohumeral effusion. Unchanged mild arthropathy of the acromioclavicular joint. Type I/II acromion. Ligaments Ligaments are suboptimally evaluated by CT. Muscles and Tendons Unchanged mild supraspinatus and moderate subscapularis muscle atrophy. Evidence biceps tenodesis. Soft tissue No fluid collection or hematoma.  No soft tissue mass. IMPRESSION: 1. Glenohumeral instability with high-riding humeral head and anterior subluxation. The humeral head abuts the coracoid process. 2. Patchy sclerosis in the humeral head consistent with avascular necrosis. No subchondral collapse. 3. Unchanged mild glenohumeral and acromioclavicular osteoarthritis. 4. Unchanged mild supraspinatus and moderate subscapularis muscle atrophy. Electronically Signed   By: Titus Dubin M.D.   On: 10/11/2021 14:15  ? ?MR SHOULDER LEFT WO CONTRAST ? ?Result Date: 10/08/2021 ?CLINICAL DATA:  Continued left shoulder pain and limited range of motion despite rotator cuff repair and subacromial decompression last December. EXAM: MRI OF THE LEFT SHOULDER WITHOUT CONTRAST TECHNIQUE: Multiplanar, multisequence MR imaging of the shoulder was performed. No intravenous contrast was administered. COMPARISON:  MRI left shoulder dated June 20, 2021. FINDINGS: Rotator cuff: Recurrent full-thickness, full width tear of the supraspinatus tendon with 4.3 cm retraction to the glenoid. Recurrent full-thickness, full width tear of the subscapularis tendon with 2.7 cm retraction to the glenoid. The  infraspinatus tendon appears intact with moderate tendinosis. The teres minor tendon is unremarkable. Muscles: New prominent muscle edema along the supraspinatus and subscapularis myotendinous junctions. Unchanged mild supraspinatus and progressive moderate subscapularis muscle atrophy. Biceps long head:  Interval tenodesis. Acromioclavicular Joint: Mild arthropathy of the  acromioclavicular joint. Type II acromion. Interval subacromial spur resection. Small amount of fluid in the subacromial/subdeltoid bursa. Glenohumeral Joint: Small joint effusion with significant synovitis. Mild c

## 2021-10-31 ENCOUNTER — Encounter: Payer: Self-pay | Admitting: Orthopedic Surgery

## 2021-10-31 LAB — SURGICAL PATHOLOGY

## 2021-11-01 DIAGNOSIS — Z96612 Presence of left artificial shoulder joint: Secondary | ICD-10-CM | POA: Diagnosis not present

## 2021-11-01 DIAGNOSIS — M25512 Pain in left shoulder: Secondary | ICD-10-CM | POA: Diagnosis not present

## 2021-11-01 DIAGNOSIS — M6281 Muscle weakness (generalized): Secondary | ICD-10-CM | POA: Diagnosis not present

## 2021-11-01 DIAGNOSIS — M25612 Stiffness of left shoulder, not elsewhere classified: Secondary | ICD-10-CM | POA: Diagnosis not present

## 2021-11-01 DIAGNOSIS — M75122 Complete rotator cuff tear or rupture of left shoulder, not specified as traumatic: Secondary | ICD-10-CM | POA: Diagnosis not present

## 2021-11-03 ENCOUNTER — Ambulatory Visit: Payer: BC Managed Care – PPO

## 2021-11-03 ENCOUNTER — Ambulatory Visit: Payer: BC Managed Care – PPO | Admitting: Podiatry

## 2021-11-03 DIAGNOSIS — M7751 Other enthesopathy of right foot: Secondary | ICD-10-CM

## 2021-11-03 DIAGNOSIS — S99921A Unspecified injury of right foot, initial encounter: Secondary | ICD-10-CM

## 2021-11-08 ENCOUNTER — Encounter: Payer: Self-pay | Admitting: Podiatry

## 2021-11-08 DIAGNOSIS — M25512 Pain in left shoulder: Secondary | ICD-10-CM | POA: Diagnosis not present

## 2021-11-08 DIAGNOSIS — Z96612 Presence of left artificial shoulder joint: Secondary | ICD-10-CM | POA: Diagnosis not present

## 2021-11-08 NOTE — Progress Notes (Signed)
?Subjective:  ?Patient ID: Caitlin Rogers, female    DOB: 1955-11-15,  MRN: [id number] ? ?Chief Complaint  ?Patient presents with  ? Foot Injury  ? ? ?66 y.o. female presents with the above complaint.  Patient presents with complaint of right second metatarsophalangeal joint pain.  Patient states painful to touch is progressive gotten worse.  She has had surgery done in the past long time ago.  She states it has been causing her a lot of pain since then.  She wanted to get it evaluated.  She has not seen anyone as prior to seeing me pain scale 7 out of 10 hurts with ambulation. ? ? ?Review of Systems: Negative except as noted in the HPI. Denies N/V/F/Ch. ? ?Past Medical History:  ?Diagnosis Date  ? Anxiety   ? Aortic atherosclerosis ([place]) 09/29/2020  ? On coronary CT   ? Arthritis   ? Atypical chest pain 11/11/2016  ? Cardiac murmur 01/19/2020  ? Cervical radiculopathy 02/13/2020  ? Chest discomfort 01/19/2020  ? Coronary artery calcification 10/07/2020  ? COVID-19 06/2019  ? Current moderate episode of major depressive disorder without prior episode ([place]) 07/06/2020  ? Dermatitis 11/01/2020  ? Essential hypertension 11/11/2016  ? GERD (gastroesophageal reflux disease) 11/11/2016  ? Hyperlipemia   ? Hyperlipidemia 11/11/2016  ? Hypertension   ? Numbness 01/02/2020  ? Other spondylosis with myelopathy, cervical region 01/19/2020  ? Palpitations 11/11/2016  ? Ringing in the ears   ? post covid symptom  ? ? ?Current Outpatient Medications:  ?  ALPRAZolam (XANAX) 0.5 MG tablet, TAKE ONE TABLET BY MOUTH TWICE A DAY AS NEEDED FOR ANXIETY, Disp: 30 tablet, Rfl: 1 ?  aspirin EC 325 MG EC tablet, Take 1 tablet (325 mg total) by mouth daily., Disp: 45 tablet, Rfl: 0 ?  diclofenac Sodium (VOLTAREN) 1 % GEL, Apply 1 application. topically 4 (four) times daily as needed (pain)., Disp: , Rfl:  ?  metoprolol succinate (TOPROL-XL) 25 MG 24 hr tablet, TAKE ONE TABLET BY MOUTH ONE TIME DAILY, Disp: 90 tablet, Rfl: 2 ?   olmesartan-hydrochlorothiazide (BENICAR HCT) 40-12.5 MG tablet, Take 0.5 tablets by mouth daily., Disp: 45 tablet, Rfl: 1 ?  omeprazole (PRILOSEC) 40 MG capsule, TAKE ONE CAPSULE BY MOUTH ONE TIME DAILY, Disp: 90 capsule, Rfl: 0 ?  oxyCODONE (OXY IR/ROXICODONE) 5 MG immediate release tablet, Take 1 tablet (5 mg total) by mouth every 4 (four) hours as needed for moderate pain., Disp: 30 tablet, Rfl: 0 ?  rosuvastatin (CRESTOR) 20 MG tablet, TAKE ONE TABLET BY MOUTH ONE TIME DAILY, Disp: 90 tablet, Rfl: 3 ?  triamcinolone cream (KENALOG) 0.1 %, Apply 1 application topically 2 (two) times daily. (Patient not taking: Reported on 10/26/2021), Disp: 30 g, Rfl: 0 ? ?Social History  ? ?Tobacco Use  ?Smoking Status Former  ?Smokeless Tobacco Never  ? ? ?Allergies  ?Allergen Reactions  ? Codeine Itching  ? Tramadol Itching  ? ?Objective:  ?There were no vitals filed for this visit. ?There is no height or weight on file to calculate BMI. ?Constitutional Well developed. ?Well nourished.  ?Vascular Dorsalis pedis pulses palpable bilaterally. ?Posterior tibial pulses palpable bilaterally. ?Capillary refill normal to all digits.  ?No cyanosis or clubbing noted. ?Pedal hair growth normal.  ?Neurologic Normal speech. ?Oriented to person, place, and time. ?Epicritic sensation to light touch grossly present bilaterally.  ?Dermatologic Nails well groomed and normal in appearance. ?No open wounds. ?No skin lesions.  ?Orthopedic: Pain on palpation of right second metatarsophalangeal joint.  Limited range of motion noted to the second MTP.  Crepitus noted with range of motion of second metatarsal phalangeal joint.  No pain at the first or the third metatarsal phalangeal joint  ? ?Radiographs: 3 views of skeletally mature adult right foot: Severe arthritis noted to the second metatarsal phalangeal joint.  Previous bunionectomy noted along with Aiken osteotomy and tailor's bunion osteotomy.  No other bony abnormalities  identified. ?Assessment:  ? ?1. Capsulitis of metatarsophalangeal (MTP) joint of right foot   ? ?Plan:  ?Patient was evaluated and treated and all questions answered. ? ?  Second metatarsophalangeal joint severe arthritis with underlying capsulitis ?-All questions and concerns were discussed with the patient in extensive detail. ?-Given the amount of pain that she is experiencing I believe she will benefit from a steroid injection to help decrease acute inflammatory component associate with arthritis.  Patient agrees with plan like to proceed with a steroid injection. ?-A steroid injection was performed at right second metatarsophalangeal joint using 1% plain Lidocaine and 10 mg of Kenalog. This was well tolerated. ?-Ultimately she will benefit from a second metatarsophalangeal joint arthroplasty/implant in the future if it continues to bother her and the steroid injections are not working she agrees with the plan ? ? ?No follow-ups on file.  ?

## 2021-11-09 DIAGNOSIS — Z96612 Presence of left artificial shoulder joint: Secondary | ICD-10-CM | POA: Diagnosis not present

## 2021-11-15 DIAGNOSIS — Z96612 Presence of left artificial shoulder joint: Secondary | ICD-10-CM | POA: Diagnosis not present

## 2021-11-15 DIAGNOSIS — M25512 Pain in left shoulder: Secondary | ICD-10-CM | POA: Diagnosis not present

## 2021-11-22 DIAGNOSIS — Z96612 Presence of left artificial shoulder joint: Secondary | ICD-10-CM | POA: Diagnosis not present

## 2021-11-22 DIAGNOSIS — M25512 Pain in left shoulder: Secondary | ICD-10-CM | POA: Diagnosis not present

## 2021-11-29 DIAGNOSIS — Z96612 Presence of left artificial shoulder joint: Secondary | ICD-10-CM | POA: Diagnosis not present

## 2021-11-29 DIAGNOSIS — M25512 Pain in left shoulder: Secondary | ICD-10-CM | POA: Diagnosis not present

## 2021-11-30 DIAGNOSIS — H2513 Age-related nuclear cataract, bilateral: Secondary | ICD-10-CM | POA: Diagnosis not present

## 2021-12-07 DIAGNOSIS — M25551 Pain in right hip: Secondary | ICD-10-CM | POA: Diagnosis not present

## 2021-12-07 DIAGNOSIS — M25552 Pain in left hip: Secondary | ICD-10-CM | POA: Diagnosis not present

## 2021-12-07 DIAGNOSIS — Z96612 Presence of left artificial shoulder joint: Secondary | ICD-10-CM | POA: Diagnosis not present

## 2021-12-07 DIAGNOSIS — M25512 Pain in left shoulder: Secondary | ICD-10-CM | POA: Diagnosis not present

## 2021-12-13 ENCOUNTER — Ambulatory Visit: Payer: BC Managed Care – PPO | Admitting: Podiatry

## 2021-12-14 DIAGNOSIS — M25512 Pain in left shoulder: Secondary | ICD-10-CM | POA: Diagnosis not present

## 2021-12-14 DIAGNOSIS — Z96612 Presence of left artificial shoulder joint: Secondary | ICD-10-CM | POA: Diagnosis not present

## 2021-12-20 DIAGNOSIS — M25512 Pain in left shoulder: Secondary | ICD-10-CM | POA: Diagnosis not present

## 2021-12-20 DIAGNOSIS — Z96612 Presence of left artificial shoulder joint: Secondary | ICD-10-CM | POA: Diagnosis not present

## 2021-12-22 DIAGNOSIS — M25512 Pain in left shoulder: Secondary | ICD-10-CM | POA: Diagnosis not present

## 2021-12-22 DIAGNOSIS — Z96612 Presence of left artificial shoulder joint: Secondary | ICD-10-CM | POA: Diagnosis not present

## 2021-12-26 ENCOUNTER — Telehealth (HOSPITAL_BASED_OUTPATIENT_CLINIC_OR_DEPARTMENT_OTHER): Payer: Self-pay | Admitting: Cardiovascular Disease

## 2021-12-26 NOTE — Telephone Encounter (Signed)
Called patient to confirm appt on Wednesday June 7. Patient stated that she last night experienced bad heartburn(throwing up as well), with a low grade fever. She doesn't know whether to keep the appt or reschedule.

## 2021-12-28 ENCOUNTER — Ambulatory Visit (INDEPENDENT_AMBULATORY_CARE_PROVIDER_SITE_OTHER): Payer: BC Managed Care – PPO | Admitting: Cardiovascular Disease

## 2021-12-28 ENCOUNTER — Encounter (HOSPITAL_BASED_OUTPATIENT_CLINIC_OR_DEPARTMENT_OTHER): Payer: Self-pay | Admitting: Cardiovascular Disease

## 2021-12-28 VITALS — BP 154/70 | HR 84 | Ht 63.0 in | Wt 168.9 lb

## 2021-12-28 DIAGNOSIS — I2510 Atherosclerotic heart disease of native coronary artery without angina pectoris: Secondary | ICD-10-CM | POA: Diagnosis not present

## 2021-12-28 DIAGNOSIS — I10 Essential (primary) hypertension: Secondary | ICD-10-CM | POA: Diagnosis not present

## 2021-12-28 DIAGNOSIS — I2584 Coronary atherosclerosis due to calcified coronary lesion: Secondary | ICD-10-CM

## 2021-12-28 DIAGNOSIS — Z5181 Encounter for therapeutic drug level monitoring: Secondary | ICD-10-CM

## 2021-12-28 DIAGNOSIS — I700 Atherosclerosis of aorta: Secondary | ICD-10-CM | POA: Diagnosis not present

## 2021-12-28 DIAGNOSIS — M25512 Pain in left shoulder: Secondary | ICD-10-CM | POA: Diagnosis not present

## 2021-12-28 DIAGNOSIS — Z96612 Presence of left artificial shoulder joint: Secondary | ICD-10-CM | POA: Diagnosis not present

## 2021-12-28 MED ORDER — OLMESARTAN MEDOXOMIL-HCTZ 40-12.5 MG PO TABS: 1.0000 | ORAL_TABLET | Freq: Every day | ORAL | 3 refills | Status: DC

## 2021-12-28 NOTE — Patient Instructions (Signed)
Medication Instructions:  INCREASE YOUR OLMESARTAN-HCT TO FULL TABLET DAILY   DECREASE YOUR ASPIRIN TO 81 MG DAILY   *If you need a refill on your cardiac medications before your next appointment, please call your pharmacy*  Lab Work: BMET IN 1 WEEK   Testing/Procedures: NONE  Follow-Up: At [facility], you and your health needs are our priority.  As part of our continuing mission to provide you with exceptional heart care, we have created designated Provider Care Teams.  These Care Teams include your primary Cardiologist (physician) and Advanced Practice Providers (APPs -  Physician Assistants and Nurse Practitioners) who all work together to provide you with the care you need, when you need it.  We recommend signing up for the patient portal called "MyChart".  Sign up information is provided on this After Visit Summary.  MyChart is used to connect with patients for Virtual Visits (Telemedicine).  Patients are able to view lab/test results, encounter notes, upcoming appointments, etc.  Non-urgent messages can be sent to your provider as well.   To learn more about what you can do with MyChart, go to [email or web address].    Your next appointment:   1 month(s)  The format for your next appointment:   In Person  Provider:   WITH CAITLIN W NP OR PHARM D   DR [place] 1 YEAR   Other Instructions MONITOR YOUR BLOOD PRESSURE DAILY. BRING LOG AND BLOOD PRESSURE MACHINE TO FOLLOW UP

## 2021-12-28 NOTE — Assessment & Plan Note (Signed)
Aspirin and statin as above.

## 2021-12-28 NOTE — Assessment & Plan Note (Signed)
Noted on coronary CTA 09/2020.  She had a score of 252 which was 92nd percentile.  She was started on a statin and her lipids are nearly at goal.  She will work on diet and exercise before we increase.  Continue rosuvastatin 20 mg daily and aspirin.  We will reduce the dose of aspirin to 81 mg daily.

## 2021-12-28 NOTE — Assessment & Plan Note (Addendum)
Blood pressure is elevated in the office today.  She thinks that it has been controlled at home.  Continue olmesartan/HCTZ and metoprolol.  We will increase the olmesartan/HCTZ to 40/12.5 mg.  Check a BMP in a week.  She will track it at home and consider whether meloxicam may be contributing.  BP goal is <130/80.  She is working on her diet at home.  Limit sodium intake.

## 2021-12-28 NOTE — Progress Notes (Signed)
Cardiology Office Note   Date:  12/28/2021   ID:  Caitlin Rogers, DOB 1955-10-02, MRN [id number]  PCP:  Caitlin Noe, MD  Cardiologist:   Caitlin Latch, MD   No chief complaint on file.     History of Present Illness: Caitlin Rogers is a 66 y.o. female with coronary calcification, hypertension, hyperlipidemia, prior tobacco abuse, and gastroesophageal reflux disease who is being seen for follow up. She was first seen 11/10/2016 for the evaluation of palpitations at the request of Caitlin Noe, MD.  Caitlin Rogers saw Dr. Ernie Rogers on 10/10/16. She reported an episode of palpitations with a heart rate in the 160s.  She moved here from [place]. She established care with Dr. Ernie Rogers in 2018 and her blood pressure and cholesterol were both elevated.  Her metoprolol was discontinued and she was started on losartan.   The episode occurred soon after stopping metoprolol and lasted for several hours.  It was associated with shortness of breath but no chest pain. She had been taking metoprolol for many years after she had an episode of palpitations while being treated for H pylori.     At her last appointment, she reported sporadic episodes of chest tightness ongoing for a month which she thought could be related to pollen allergies. She noted having mild exertional shortness of breath. Her blood pressure was [id number] systolic at home. Metoprolol succinate was resumed at 25 mg daily. An ETT was ordered to evaluate for ischemia. She was not seen again by cardiology until she saw Dr. Geraldo Rogers in 2021 and reported palpitations. She wore a monitor that showed rare PAC's and PVC's. Nuclear stress test revealed LVEF >65% and no ischemia. She had mild LVH on Echo but it was otherwise unremarkable. She saw Dr. Geraldo Rogers on 01/21/2021 and he noted that her LFTs were elevated. He recommended to decrease alcohol intake and increase exercise.   Today, she seems well. This past Monday she had called the office due to  feeling ill. Over the past couple of days she has experienced a chest tightness, which was somewhat reproducible on palpation. Of note, she has also noticed some chest tightness while walking, but this is not alarming to her and she attributes it to deconditioning. She has had some coughing that has lasted more than a couple of days as well. Her blood pressure today is 158/80. She admits to not keeping track of her blood pressure but has logged some of them from home with a range of 379-024 systolic.  She had a fall in 03/2021 and suffered a severe left shoulder injury. She needed further surgery 10/2021, from which she is recovering. Now, she is going to PT twice a week. She also complains of severe hip pain with walking. Her pain was thought to be due to bursitis and she received an injection for treatment. Today her pain has improved but is still present. Generally, she has also been immobile since September and has retired from work. Her shoulder injury and hip pain have severely limited her activity and formal exercise. Her diet has consisted of trying to eat more fish and limiting fried foods. She has also been looking at more healthier options for her meals. She has been working with her PCP on reducing her medications. Previously she was taking 250 mg sertraline daily, and was suffering from tremors. Since stopping the sertraline her tremors have resolved. She denies any palpitations, shortness of breath, or peripheral edema. No lightheadedness, headaches, syncope, orthopnea, or  PND.    Past Medical History:  Diagnosis Date   Anxiety    Aortic atherosclerosis ([place]) 09/29/2020   On coronary CT    Arthritis    Atypical chest pain 11/11/2016   Cardiac murmur 01/19/2020   Cervical radiculopathy 02/13/2020   Chest discomfort 01/19/2020   Coronary artery calcification 10/07/2020   COVID-19 06/2019   Current moderate episode of major depressive disorder without prior episode ([place]) 07/06/2020    Dermatitis 11/01/2020   Essential hypertension 11/11/2016   GERD (gastroesophageal reflux disease) 11/11/2016   Hyperlipemia    Hyperlipidemia 11/11/2016   Hypertension    Numbness 01/02/2020   Other spondylosis with myelopathy, cervical region 01/19/2020   Palpitations 11/11/2016   Ringing in the ears    post covid symptom    Past Surgical History:  Procedure Laterality Date   APPENDECTOMY     BICEPT TENODESIS Left 10/28/2021   Procedure: BICEPS TENODESIS;  Surgeon: Caitlin Fabry, MD;  Location: [facility] ORS;  Service: Orthopedics;  Laterality: Left;   BUNIONECTOMY     bilat   CERVICAL SPINE SURGERY     CESAREAN SECTION     in toe   COLONOSCOPY  03/10/2016   Dr.Stark   MOUTH SURGERY  2019   implant with crown   POLYPECTOMY     REVERSE SHOULDER ARTHROPLASTY Left 10/28/2021   Procedure: Left reverse shoulder arthroplasty, biceps tenodesis;  Surgeon: Caitlin Fabry, MD;  Location: [facility] ORS;  Service: Orthopedics;  Laterality: Left;   SHOULDER ARTHROSCOPY WITH ROTATOR CUFF REPAIR AND SUBACROMIAL DECOMPRESSION Left 07/01/2021   Procedure: Left shoulder arthroscopic subscapularis repair, mini-open supraspinatus repair, subacromial decompression, and bicepst tenodesis;  Surgeon: Caitlin Fabry, MD;  Location: [place];  Service: Orthopedics;  Laterality: Left;   TONSILLECTOMY     TUBAL LIGATION     UPPER GASTROINTESTINAL ENDOSCOPY     WISDOM TOOTH EXTRACTION       Current Outpatient Medications  Medication Sig Dispense Refill   ALPRAZolam (XANAX) 0.5 MG tablet TAKE ONE TABLET BY MOUTH TWICE A DAY AS NEEDED FOR ANXIETY 30 tablet 1   aspirin EC 81 MG tablet Take 81 mg by mouth daily. Swallow whole.     diclofenac Sodium (VOLTAREN) 1 % GEL Apply 1 application. topically 4 (four) times daily as needed (pain).     meloxicam (MOBIC) 15 MG tablet Take 15 mg by mouth daily.     metoprolol succinate (TOPROL-XL) 25 MG 24 hr tablet TAKE ONE TABLET BY MOUTH ONE TIME DAILY 90 tablet 2    omeprazole (PRILOSEC) 40 MG capsule TAKE ONE CAPSULE BY MOUTH ONE TIME DAILY 90 capsule 0   rosuvastatin (CRESTOR) 20 MG tablet TAKE ONE TABLET BY MOUTH ONE TIME DAILY 90 tablet 3   triamcinolone cream (KENALOG) 0.1 % Apply 1 application topically 2 (two) times daily. 30 g 0   olmesartan-hydrochlorothiazide (BENICAR HCT) 40-12.5 MG tablet Take 1 tablet by mouth daily. 90 tablet 3   No current facility-administered medications for this visit.    Allergies:   Codeine and Tramadol    Social History:  The patient  reports that she has quit smoking. She has never used smokeless tobacco. She reports current alcohol use of about 21.0 standard drinks per week. She reports current drug use. Drug: Marijuana.   Family History:  The patient's family history includes CAD in her brother; Colon cancer in her paternal aunt; Diabetes Mellitus II in her mother; Heart attack in her paternal grandfather; Heart disease in her brother and  father; Hypertension in her father and mother; Stroke in her brother and father.    ROS:   Please see the history of present illness. (+) Chest tightness (+) Hip pain (+) Bilateral LE edema All other systems are reviewed and negative.    PHYSICAL EXAM: VS:  BP (!) 154/70 (BP Location: Right Arm, Patient Position: Sitting, Cuff Size: Normal)   Pulse 84   Ht '5\' 3"'$  (1.6 m)   Wt 168 lb 14.4 oz (76.6 kg)   SpO2 99%   BMI 29.92 kg/m  , BMI Body mass index is 29.92 kg/m. GENERAL:  Well appearing HEENT:  Pupils equal round and reactive, fundi not visualized, oral mucosa unremarkable NECK:  No jugular venous distention, waveform within normal limits, carotid upstroke brisk and symmetric, no bruits, no thyromegaly LYMPHATICS:  No cervical adenopathy LUNGS:  Clear to auscultation bilaterally HEART:  RRR.  PMI not displaced or sustained,S1 and S2 within normal limits, no S3, no S4, no clicks, no rubs, no murmurs ABD:  Flat, positive bowel sounds normal in frequency in pitch,  no bruits, no rebound, no guarding, no midline pulsatile mass, no hepatomegaly, no splenomegaly EXT:  2 plus pulses throughout, no edema, no cyanosis no clubbing SKIN:  No rashes no nodules NEURO:  Cranial nerves II through XII grossly intact, motor grossly intact throughout PSYCH:  Cognitively intact, oriented to person place and time    EKG: EKG is personally reviewed. 12/28/2021: EKG was not ordered. 11/10/2016: sinus rhythm. Rate 64 bpm.  Coronary Calcium Score 09/29/2020 FINDINGS: Non-cardiac: See separate report from [facility].   Ascending Aorta: Normal caliber. Calcification noted in the descending aorta.   Pericardium: Normal   Coronary arteries: Normal origins and anatomy. Right dominant. Calcification note din all three coronary distributions.   IMPRESSION: Coronary calcium score of 225. This was 92nd percentile for age and sex matched control.   Recommend aggressive risk factor modification including LDL goal <70.  Monitor 01/2020: EVENT MONITOR REPORT:     Patient was monitored from 01/19/2020 to 02/02/2020. Indication:                    Palpitations Ordering physician:  Jenean Lindau, MD  Referring physician:        Jenean Lindau, MD      Baseline rhythm: Sinus   Minimum heart rate: 58 BPM.  Average heart rate: 80 BPM.  Maximal heart rate 116 BPM.   Atrial arrhythmia: Rare PACs   Ventricular arrhythmia: Rare PVCs   Conduction abnormality: None significant   Symptoms: None significant     Conclusion:  Unremarkable event monitor  Lexiscan Myoview  02/16/2020 Nuclear stress EF: 90%. There was no ST segment deviation noted during stress. The study is normal. This is a low risk study. The left ventricular ejection fraction is hyperdynamic (>65%).   Normal resting and stress perfusion. No ischemia or infarction EF Estimated at 90% May not be that high but is normal   Renal Artery Doppler 04/04/2019 IMPRESSION: 1. No evidence of renal  artery stenosis. 2. Normal sonographic appearance of the kidneys. No evidence of hydronephrosis. 3. Hepatic steatosis.     Recent Labs: 10/14/2021: ALT 23 10/29/2021: BUN 19; Creatinine, Ser 0.86; Hemoglobin 10.3; Platelets 211; Potassium 3.6; Sodium 137  07/29/16: WBC 3.2, hemoglobin 13.3, hematocrit 39.4, platelets 214 Sodium 145, potassium 4.6, BUN 11, creatinine 0.58 AST 27, ALT 17 TSH 2.37 Total cholesterol 266, triglycerides 256, HDL 71, LDL 144  Lipid Panel  Component Value Date/Time   CHOL 204 (H) 06/14/2021 0903   CHOL 227 (H) 12/03/2020 1103   TRIG 149.0 06/14/2021 0903   HDL 98.50 06/14/2021 0903   HDL 103 12/03/2020 1103   CHOLHDL 2 06/14/2021 0903   VLDL 29.8 06/14/2021 0903   LDLCALC 75 06/14/2021 0903   LDLCALC 103 (H) 12/03/2020 1103      Wt Readings from Last 3 Encounters:  12/28/21 168 lb 14.4 oz (76.6 kg)  10/28/21 168 lb (76.2 kg)  10/26/21 168 lb 4 oz (76.3 kg)      ASSESSMENT AND PLAN:  Hypertension Blood pressure is elevated in the office today.  She thinks that it has been controlled at home.  Continue olmesartan/HCTZ and metoprolol.  We will increase the olmesartan/HCTZ to 40/12.5 mg.  Check a BMP in a week.  She will track it at home and consider whether meloxicam may be contributing.  BP goal is <130/80.  She is working on her diet at home.  Limit sodium intake.  Coronary artery calcification Noted on coronary CTA 09/2020.  She had a score of 252 which was 92nd percentile.  She was started on a statin and her lipids are nearly at goal.  She will work on diet and exercise before we increase.  Continue rosuvastatin 20 mg daily and aspirin.  We will reduce the dose of aspirin to 81 mg daily.  Aortic atherosclerosis (HCC) Aspirin and statin as above.   Current medicines are reviewed at length with the patient today.  The patient does not have concerns regarding medicines.  The following changes have been made:  Restart metoprolol 25 mg daily.     Labs/ tests ordered today include:   Orders Placed This Encounter  Procedures   Basic metabolic panel   Amb Referral To Provider Referral Exercise Program (P.R.E.P)     Disposition:   FU with Shabree Tebbetts C. Oval Linsey, MD, [facility] in 1 year.    I,Jenifer Arts development officer as a Education administrator for [place], MD.,have documented all relevant documentation on the behalf of Caitlin Latch, MD,as directed by  Caitlin Latch, MD while in the presence of Caitlin Latch, MD.   I, [place] Oval Linsey, MD have reviewed all documentation for this visit.  The documentation of the exam, diagnosis, procedures, and orders on 12/28/2021 are all accurate and complete.   Signed, Denym Christenberry C. Oval Linsey, MD, [facility]  12/28/2021 5:37 PM    [place]

## 2021-12-29 ENCOUNTER — Telehealth: Payer: Self-pay

## 2021-12-29 NOTE — Telephone Encounter (Signed)
Called discuss PREP program referral, she is driving and will return my call

## 2021-12-30 ENCOUNTER — Telehealth: Payer: Self-pay

## 2021-12-30 NOTE — Telephone Encounter (Signed)
Returned her call from yesterday evening GQ:BVQX program referral, left voicemail

## 2021-12-30 NOTE — Telephone Encounter (Signed)
Returned her call, wants to attend PREP at [facility], having cataract surgery this month, most likely will not be able to attend until July; will contact later this month with July schedule.

## 2021-12-31 ENCOUNTER — Other Ambulatory Visit: Payer: Self-pay | Admitting: Family Medicine

## 2022-01-03 DIAGNOSIS — H25812 Combined forms of age-related cataract, left eye: Secondary | ICD-10-CM | POA: Diagnosis not present

## 2022-01-03 DIAGNOSIS — H2512 Age-related nuclear cataract, left eye: Secondary | ICD-10-CM | POA: Diagnosis not present

## 2022-01-03 DIAGNOSIS — H25012 Cortical age-related cataract, left eye: Secondary | ICD-10-CM | POA: Diagnosis not present

## 2022-01-03 DIAGNOSIS — H269 Unspecified cataract: Secondary | ICD-10-CM | POA: Diagnosis not present

## 2022-01-05 DIAGNOSIS — I10 Essential (primary) hypertension: Secondary | ICD-10-CM | POA: Diagnosis not present

## 2022-01-05 DIAGNOSIS — Z5181 Encounter for therapeutic drug level monitoring: Secondary | ICD-10-CM | POA: Diagnosis not present

## 2022-01-05 LAB — BASIC METABOLIC PANEL
BUN/Creatinine Ratio: 28 (ref 12–28)
BUN: 23 mg/dL (ref 8–27)
CO2: 21 mmol/L (ref 20–29)
Calcium: 9.7 mg/dL (ref 8.7–10.3)
Chloride: 103 mmol/L (ref 96–106)
Creatinine, Ser: 0.83 mg/dL (ref 0.57–1.00)
Glucose: 92 mg/dL (ref 70–99)
Potassium: 4.5 mmol/L (ref 3.5–5.2)
Sodium: 142 mmol/L (ref 134–144)
eGFR: 79 mL/min/{1.73_m2} (ref >59)

## 2022-01-12 DIAGNOSIS — M25512 Pain in left shoulder: Secondary | ICD-10-CM | POA: Diagnosis not present

## 2022-01-12 DIAGNOSIS — Z96612 Presence of left artificial shoulder joint: Secondary | ICD-10-CM | POA: Diagnosis not present

## 2022-01-17 DIAGNOSIS — H25011 Cortical age-related cataract, right eye: Secondary | ICD-10-CM | POA: Diagnosis not present

## 2022-01-17 DIAGNOSIS — H269 Unspecified cataract: Secondary | ICD-10-CM | POA: Diagnosis not present

## 2022-01-17 DIAGNOSIS — H52221 Regular astigmatism, right eye: Secondary | ICD-10-CM | POA: Diagnosis not present

## 2022-01-17 DIAGNOSIS — H2511 Age-related nuclear cataract, right eye: Secondary | ICD-10-CM | POA: Diagnosis not present

## 2022-01-22 ENCOUNTER — Other Ambulatory Visit: Payer: Self-pay | Admitting: Gastroenterology

## 2022-01-23 DIAGNOSIS — Z96612 Presence of left artificial shoulder joint: Secondary | ICD-10-CM | POA: Diagnosis not present

## 2022-01-23 DIAGNOSIS — M25512 Pain in left shoulder: Secondary | ICD-10-CM | POA: Diagnosis not present

## 2022-01-25 ENCOUNTER — Telehealth: Payer: Self-pay

## 2022-01-25 NOTE — Telephone Encounter (Signed)
Called to confirm participation in next PREP class at [facility]

## 2022-01-25 NOTE — Telephone Encounter (Signed)
She returned my call, plans to attend PREP at [facility] on July 17, every M/W 2-3:15; assessment visit scheduled for 7/11 at 3 pm

## 2022-01-26 DIAGNOSIS — Z96612 Presence of left artificial shoulder joint: Secondary | ICD-10-CM | POA: Diagnosis not present

## 2022-01-26 DIAGNOSIS — M25512 Pain in left shoulder: Secondary | ICD-10-CM | POA: Diagnosis not present

## 2022-01-30 ENCOUNTER — Ambulatory Visit (INDEPENDENT_AMBULATORY_CARE_PROVIDER_SITE_OTHER): Payer: BC Managed Care – PPO | Admitting: Family

## 2022-01-30 ENCOUNTER — Encounter (HOSPITAL_BASED_OUTPATIENT_CLINIC_OR_DEPARTMENT_OTHER): Payer: Self-pay | Admitting: Family

## 2022-01-30 VITALS — BP 114/60 | HR 88 | Ht 63.0 in | Wt 170.0 lb

## 2022-01-30 DIAGNOSIS — I10 Essential (primary) hypertension: Secondary | ICD-10-CM | POA: Diagnosis not present

## 2022-01-30 DIAGNOSIS — I25118 Atherosclerotic heart disease of native coronary artery with other forms of angina pectoris: Secondary | ICD-10-CM | POA: Diagnosis not present

## 2022-01-30 DIAGNOSIS — I700 Atherosclerosis of aorta: Secondary | ICD-10-CM

## 2022-01-30 MED ORDER — OLMESARTAN MEDOXOMIL-HCTZ 20-12.5 MG PO TABS: 1.0000 | ORAL_TABLET | Freq: Every day | ORAL | 2 refills | Status: DC

## 2022-01-30 NOTE — Progress Notes (Signed)
Office Visit    Patient Name: Caitlin Rogers Date of Encounter: 01/30/2022  PCP:  Lesleigh Noe, MD   [place]  Cardiologist:  Skeet Latch, MD  Advanced Practice Provider:  No care team member to display Electrophysiologist:  None      Chief Complaint    Caitlin Rogers is a 66 y.o. female presents today for hypertension follow up.    Past Medical History    Past Medical History:  Diagnosis Date   Anxiety    Aortic atherosclerosis ([place]) 09/29/2020   On coronary CT    Arthritis    Atypical chest pain 11/11/2016   Cardiac murmur 01/19/2020   Cervical radiculopathy 02/13/2020   Chest discomfort 01/19/2020   Coronary artery calcification 10/07/2020   COVID-19 06/2019   Current moderate episode of major depressive disorder without prior episode ([place]) 07/06/2020   Dermatitis 11/01/2020   Essential hypertension 11/11/2016   GERD (gastroesophageal reflux disease) 11/11/2016   Hyperlipemia    Hyperlipidemia 11/11/2016   Hypertension    Numbness 01/02/2020   Other spondylosis with myelopathy, cervical region 01/19/2020   Palpitations 11/11/2016   Ringing in the ears    post covid symptom   Past Surgical History:  Procedure Laterality Date   APPENDECTOMY     BICEPT TENODESIS Left 10/28/2021   Procedure: BICEPS TENODESIS;  Surgeon: Leim Fabry, MD;  Location: [facility] ORS;  Service: Orthopedics;  Laterality: Left;   BUNIONECTOMY     bilat   CERVICAL SPINE SURGERY     CESAREAN SECTION     in toe   COLONOSCOPY  03/10/2016   Dr.Stark   MOUTH SURGERY  10-29-2017   implant with crown   POLYPECTOMY     REVERSE SHOULDER ARTHROPLASTY Left 10/28/2021   Procedure: Left reverse shoulder arthroplasty, biceps tenodesis;  Surgeon: Leim Fabry, MD;  Location: [facility] ORS;  Service: Orthopedics;  Laterality: Left;   SHOULDER ARTHROSCOPY WITH ROTATOR CUFF REPAIR AND SUBACROMIAL DECOMPRESSION Left 07/01/2021   Procedure: Left shoulder arthroscopic subscapularis  repair, mini-open supraspinatus repair, subacromial decompression, and bicepst tenodesis;  Surgeon: Leim Fabry, MD;  Location: [place];  Service: Orthopedics;  Laterality: Left;   TONSILLECTOMY     TUBAL LIGATION     UPPER GASTROINTESTINAL ENDOSCOPY     WISDOM TOOTH EXTRACTION      Allergies  Allergies  Allergen Reactions   Codeine Itching   Tramadol Itching    History of Present Illness    Caitlin Rogers is a 66 y.o. female with a hx of CAD, HTN, HLD, prior tobacco use, GERD last seen 12/28/2021.  At last clinic visit her BP was elevated in clinic but well controlled at home.  Olmesartan/HCTZ and metoprolol were continued.  Olmesartan HCTZ was increased to 40-12.5 mg.  She was referred to prep exercise program.  Presents today for follow-up. She and her husband celebrated their anniversary this weekend. She has started some walking. Excited to start PREP program at [facility]. Some exertional dyspnea which she attributes to inactivity. BP readings at home 90s-110s/60s-70s. Notes some fatigue.   EKGs/Labs/Other Studies Reviewed:   The following studies were reviewed today:  CT Oct 29, 2020 IMPRESSION: 1. Findings in the lungs are suggestive of bronchitis, potentially with developing multilobar bilateral bronchopneumonia. 2. Aortic Atherosclerosis (ICD10-I70.0). 3. Moderate-sized hiatal hernia.  EKG:  No EKG today.   Recent Labs: 10/14/2021: ALT 23 10/29/2021: Hemoglobin 10.3; Platelets 211 01/05/2022: BUN 23; Creatinine, Ser 0.83; Potassium 4.5; Sodium 142  Recent Lipid Panel  Component Value Date/Time   CHOL 204 (H) 06/14/2021 0903   CHOL 227 (H) 12/03/2020 1103   TRIG 149.0 06/14/2021 0903   HDL 98.50 06/14/2021 0903   HDL 103 12/03/2020 1103   CHOLHDL 2 06/14/2021 0903   VLDL 29.8 06/14/2021 0903   LDLCALC 75 06/14/2021 0903   LDLCALC 103 (H) 12/03/2020 1103   Home Medications   Current Meds  Medication Sig   ALPRAZolam (XANAX) 0.5 MG tablet TAKE ONE TABLET BY  MOUTH TWICE A DAY AS NEEDED FOR ANXIETY   aspirin EC 81 MG tablet Take 81 mg by mouth daily. Swallow whole.   diclofenac Sodium (VOLTAREN) 1 % GEL Apply 1 application. topically 4 (four) times daily as needed (pain).   metoprolol succinate (TOPROL-XL) 25 MG 24 hr tablet TAKE ONE TABLET BY MOUTH ONE TIME DAILY   olmesartan-hydrochlorothiazide (BENICAR HCT) 40-12.5 MG tablet Take 1 tablet by mouth daily.   omeprazole (PRILOSEC) 40 MG capsule TAKE ONE CAPSULE BY MOUTH ONE TIME DAILY   rosuvastatin (CRESTOR) 20 MG tablet TAKE ONE TABLET BY MOUTH ONE TIME DAILY    Review of Systems      All other systems reviewed and are otherwise negative except as noted above.  Physical Exam    VS:  BP 114/60   Pulse 88   Ht '5\' 3"'$  (1.6 m)   Wt 170 lb (77.1 kg)   BMI 30.11 kg/m  , BMI Body mass index is 30.11 kg/m.  Wt Readings from Last 3 Encounters:  01/30/22 170 lb (77.1 kg)  12/28/21 168 lb 14.4 oz (76.6 kg)  10/28/21 168 lb (76.2 kg)     GEN: Well nourished, overweight, well developed, in no acute distress. HEENT: normal. Neck: Supple, no JVD, carotid bruits, or masses. Cardiac: RRR, no murmurs, rubs, or gallops. No clubbing, cyanosis, edema.  Radials/PT 2+ and equal bilaterally.  Respiratory:  Respirations regular and unlabored, clear to auscultation bilaterally. GI: Soft, nontender, nondistended. MS: No deformity or atrophy. Skin: Warm and dry, no rash. Neuro:  Strength and sensation are intact. Psych: Normal affect.  Assessment & Plan    Hypertension - Now with hypotension. Reduce Benicar from 40-12.'5mg'$  to 20-12.'5mg'$  QD. MyChart message in 1 week to check in.   CAD /aortic atherosclerosis-CTA 09/2020 coronary calcium score 252 placing her in the 92nd percentile. Stable with no anginal symptoms. No indication for ischemic evaluation.  GDMT aspirin, metoprolol, rosuvastatin. Heart healthy diet and regular cardiovascular exercise encouraged.    HLD, LDL goal less than 70 - 05/2021 LDL 75.  Continue Rosuvastatin '20mg'$  QD. Anticipate LDL will lower further with participation in PREP exercise program.   Obesity - Starts PREP program tomorrow. Weight loss via diet and exercise encouraged. Discussed the impact being overweight would have on cardiovascular risk.   Disposition: Follow up in 3 month(s) with Skeet Latch, MD or APP.  Signed, Loel Dubonnet, NP 01/30/2022, 10:52 AM [place]

## 2022-01-30 NOTE — Patient Instructions (Addendum)
Medication Instructions:  Your physician has recommended you make the following change in your medication:   STOP Olmesartan-HCTZ 40-12.'5mg'$  daily  START Olmesartan-HCTZ 20-12.'5mg'$  daily   Loel Dubonnet, NP will send you a MyChart message in 1 week to check in on blood pressure.   *If you need a refill on your cardiac medications before your next appointment, please call your pharmacy*  Lab Work: None ordered today.   Testing/Procedures: None ordered today.   Follow-Up: At [facility], you and your health needs are our priority.  As part of our continuing mission to provide you with exceptional heart care, we have created designated Provider Care Teams.  These Care Teams include your primary Cardiologist (physician) and Advanced Practice Providers (APPs -  Physician Assistants and Nurse Practitioners) who all work together to provide you with the care you need, when you need it.  We recommend signing up for the patient portal called "MyChart".  Sign up information is provided on this After Visit Summary.  MyChart is used to connect with patients for Virtual Visits (Telemedicine).  Patients are able to view lab/test results, encounter notes, upcoming appointments, etc.  Non-urgent messages can be sent to your provider as well.   To learn more about what you can do with MyChart, go to [email or web address].    Your next appointment:   3 month(s)  The format for your next appointment:   In Person  Provider:   Skeet Latch, MD or Loel Dubonnet, NP    Other Instructions  Heart Healthy Diet Recommendations: A low-salt diet is recommended. Meats should be grilled, baked, or boiled. Avoid fried foods. Focus on lean protein sources like fish or chicken with vegetables and fruits. The American Heart Association is a Microbiologist!  American Heart Association Diet and Lifeystyle Recommendations   Exercise recommendations: The American Heart Association recommends 150 minutes  of moderate intensity exercise weekly. Try 30 minutes of moderate intensity exercise 4-5 times per week. This could include walking, jogging, or swimming.  Important Information About Sugar

## 2022-01-31 DIAGNOSIS — Z96612 Presence of left artificial shoulder joint: Secondary | ICD-10-CM | POA: Diagnosis not present

## 2022-02-02 DIAGNOSIS — M25512 Pain in left shoulder: Secondary | ICD-10-CM | POA: Diagnosis not present

## 2022-02-02 DIAGNOSIS — Z96612 Presence of left artificial shoulder joint: Secondary | ICD-10-CM | POA: Diagnosis not present

## 2022-02-06 ENCOUNTER — Encounter (HOSPITAL_BASED_OUTPATIENT_CLINIC_OR_DEPARTMENT_OTHER): Payer: Self-pay

## 2022-02-06 DIAGNOSIS — M25512 Pain in left shoulder: Secondary | ICD-10-CM | POA: Diagnosis not present

## 2022-02-06 DIAGNOSIS — I10 Essential (primary) hypertension: Secondary | ICD-10-CM

## 2022-02-06 DIAGNOSIS — Z96612 Presence of left artificial shoulder joint: Secondary | ICD-10-CM | POA: Diagnosis not present

## 2022-02-06 DIAGNOSIS — F419 Anxiety disorder, unspecified: Secondary | ICD-10-CM

## 2022-02-06 DIAGNOSIS — F321 Major depressive disorder, single episode, moderate: Secondary | ICD-10-CM

## 2022-02-06 NOTE — Progress Notes (Signed)
[facility] PREP Weekly Session  Patient Details  Name: Jaquia Benedicto MRN: [id number] Date of Birth: [birth year] Age: 66 y.o. PCP: Lesleigh Noe, MD  There were no vitals filed for this visit.   [facility] Weekly seesion - 02/06/22 1500       [facility] "PREP" Location   [facility] "PREP" Location [name]      Weekly Session   Topic Discussed Goal setting and welcome to the program   Introductions, review of work book, tour of facility; offered short cardio session work out; sitting no more than 30 minutes   Classes attended to date [place] 02/06/2022, 3:33 PM

## 2022-02-07 MED ORDER — OLMESARTAN MEDOXOMIL-HCTZ 20-12.5 MG PO TABS: 0.5000 | ORAL_TABLET | Freq: Every day | ORAL | 2 refills | Status: DC

## 2022-02-08 DIAGNOSIS — M25512 Pain in left shoulder: Secondary | ICD-10-CM | POA: Diagnosis not present

## 2022-02-08 DIAGNOSIS — Z96612 Presence of left artificial shoulder joint: Secondary | ICD-10-CM | POA: Diagnosis not present

## 2022-02-13 ENCOUNTER — Encounter: Payer: Self-pay | Admitting: *Deleted

## 2022-02-13 NOTE — Progress Notes (Signed)
[facility] PREP Weekly Session  Patient Details  Name: Caitlin Rogers MRN: [id number] Date of Birth: 08-21-1955 Age: 66 y.o. PCP: Lesleigh Noe, MD  Vitals:   02/13/22 1538  Weight: 169 lb 8 oz (76.9 kg)     [facility] Weekly seesion - 02/13/22 1500       [facility] "PREP" Location   [facility] "PREP" Location [name]      Weekly Session   Topic Discussed Other ways to be active;Importance of resistance training   Working towards: 150 mins of cardio/week, strength training 2-3x's/week for 20-40 mins   Classes attended to date [place], [place] 02/13/2022, 3:40 PM

## 2022-02-14 MED ORDER — OLMESARTAN MEDOXOMIL 5 MG PO TABS: 5.0000 mg | ORAL_TABLET | Freq: Every day | ORAL | 2 refills | Status: DC

## 2022-02-14 NOTE — Addendum Note (Signed)
Addended by: Loel Dubonnet on: 02/14/2022 05:22 PM   Modules accepted: Orders

## 2022-02-20 DIAGNOSIS — L814 Other melanin hyperpigmentation: Secondary | ICD-10-CM | POA: Diagnosis not present

## 2022-02-20 DIAGNOSIS — L720 Epidermal cyst: Secondary | ICD-10-CM | POA: Diagnosis not present

## 2022-02-20 DIAGNOSIS — L821 Other seborrheic keratosis: Secondary | ICD-10-CM | POA: Diagnosis not present

## 2022-02-20 DIAGNOSIS — C44319 Basal cell carcinoma of skin of other parts of face: Secondary | ICD-10-CM | POA: Diagnosis not present

## 2022-02-20 DIAGNOSIS — D485 Neoplasm of uncertain behavior of skin: Secondary | ICD-10-CM | POA: Diagnosis not present

## 2022-02-20 NOTE — Progress Notes (Signed)
[facility] PREP Weekly Session  Patient Details  Name: Caitlin Rogers MRN: [id number] Date of Birth: 09-Jul-1956 Age: 66 y.o. PCP: Lesleigh Noe, MD  Vitals:   02/20/22 1530  Weight: 172 lb (78 kg)     [facility] Weekly seesion - 02/20/22 1500       [facility] "PREP" Location   [facility] "PREP" Location [name]      Weekly Session   Topic Discussed Healthy eating tips   Introduced to [employer], water: 1/2 body wt in oz OR 64 oz/day   Minutes exercised this week 225 minutes    Classes attended to date [place] 02/20/2022, 3:31 PM

## 2022-02-23 DIAGNOSIS — M25552 Pain in left hip: Secondary | ICD-10-CM | POA: Diagnosis not present

## 2022-02-23 DIAGNOSIS — M25551 Pain in right hip: Secondary | ICD-10-CM | POA: Diagnosis not present

## 2022-02-28 ENCOUNTER — Telehealth: Payer: Self-pay | Admitting: Family Medicine

## 2022-02-28 DIAGNOSIS — F321 Major depressive disorder, single episode, moderate: Secondary | ICD-10-CM

## 2022-02-28 DIAGNOSIS — F419 Anxiety disorder, unspecified: Secondary | ICD-10-CM

## 2022-02-28 NOTE — Telephone Encounter (Signed)
  Encourage patient to contact the pharmacy for refills or they can request refills through [facility]:  Please schedule appointment if longer than 1 year  NEXT APPOINTMENT DATE:  MEDICATION:ALPRAZolam (XANAX) 0.5 MG tablet  Is the patient out of medication?   [place], [place]   Let patient know to contact pharmacy at the end of the day to make sure medication is ready.  Please notify patient to allow 48-72 hours to process  CLINICAL FILLS OUT ALL BELOW:   LAST REFILL:  QTY:  REFILL DATE:    OTHER COMMENTS:    Okay for refill?  Please advise

## 2022-02-28 NOTE — Telephone Encounter (Signed)
Pt was advised to reduce dose to using <#60 per year.   If she wants a refill she can schedule an appointment.

## 2022-02-28 NOTE — Telephone Encounter (Signed)
Refill request  Alprazolam Last refill 10/26/21 #30/1 Last office visit 10/26/21 No upcoming appointment scheduled

## 2022-03-03 DIAGNOSIS — M25552 Pain in left hip: Secondary | ICD-10-CM | POA: Insufficient documentation

## 2022-03-06 NOTE — Progress Notes (Signed)
[facility] PREP Weekly Session  Patient Details  Name: Ermalinda Joubert MRN: [id number] Date of Birth: Nov 22, 1955 Age: 66 y.o. PCP: Lesleigh Noe, MD  Vitals:   03/06/22 1535  Weight: 167 lb 3.2 oz (75.8 kg)     [facility] Weekly seesion - 03/06/22 1500       [facility] "PREP" Location   [facility] "PREP" Location [name]      Weekly Session   Topic Discussed Restaurant Eating   Salt demo: Limit salt intake 1500-2300 mg/day   Minutes exercised this week 200 minutes    Classes attended to date [place] 03/06/2022, 3:36 PM

## 2022-03-07 NOTE — Telephone Encounter (Signed)
Patient has been scheduled

## 2022-03-08 MED ORDER — ALPRAZOLAM 0.5 MG PO TABS: ORAL_TABLET | ORAL | 0 refills | Status: DC

## 2022-03-08 NOTE — Telephone Encounter (Signed)
Patient returning you call 

## 2022-03-08 NOTE — Telephone Encounter (Signed)
Patient is returning call.  °

## 2022-03-08 NOTE — Telephone Encounter (Addendum)
Returned call to patient.   She is participating in the [place] program at the [facility] and enjoying. Notes recent stressors with biopsy on her nose requiring upcoming Mohs and bursitis in both hips.   Transitioning to new PCP next month and concerned as she only has 1 tablet of Xanax left. PDMP reviewed - has received 60 tabs of Xanax over the last 6 months as prescribed by prior PCP.   Agreed to send 10 tabs of Xanax to her pharmacy to hold her over until establishes with new PCP 04/14/22 and defer further refills to them.   Loel Dubonnet, NP 03/08/22 1:31 PM

## 2022-03-08 NOTE — Telephone Encounter (Signed)
Called per patient request. No answer. Left VM per DPR.   Loel Dubonnet, NP

## 2022-03-08 NOTE — Addendum Note (Signed)
Addended by: Loel Dubonnet on: 03/08/2022 01:50 PM   Modules accepted: Orders

## 2022-03-13 ENCOUNTER — Ambulatory Visit: Payer: BC Managed Care – PPO | Admitting: Family Medicine

## 2022-03-13 NOTE — Progress Notes (Signed)
[facility] PREP Weekly Session  Patient Details  Name: Caitlin Rogers MRN: [id number] Date of Birth: 1955/12/31 Age: 66 y.o. PCP: Lesleigh Noe, MD  Vitals:   03/13/22 1527  Weight: 168 lb (76.2 kg)     [facility] Weekly seesion - 03/13/22 1500       [facility] "PREP" Location   [facility] "PREP" Location [name]      Weekly Session   Topic Discussed Stress management and problem solving   Finger tip mudra breathwork; shared fruit bowl guided meditation   Classes attended to date [place] 03/13/2022, 3:28 PM

## 2022-03-15 ENCOUNTER — Encounter: Payer: BC Managed Care – PPO | Admitting: Family Medicine

## 2022-03-20 NOTE — Progress Notes (Signed)
[facility] PREP Weekly Session  Patient Details  Name: Caitlin Rogers MRN: [id number] Date of Birth: 08-Dec-1955 Age: 66 y.o. PCP: Lesleigh Noe, MD  Vitals:   03/20/22 1503  Weight: 171 lb 9.6 oz (77.8 kg)     [facility] Weekly seesion - 03/20/22 1500       [facility] "PREP" Location   [facility] "PREP" Location [name]      Weekly Session   Topic Discussed Expectations and non-scale victories   Staying positive, halfway thru program---review, revisit, restate goals   Classes attended to date [place] 03/20/2022, 3:04 PM

## 2022-03-24 DIAGNOSIS — M654 Radial styloid tenosynovitis [de Quervain]: Secondary | ICD-10-CM | POA: Diagnosis not present

## 2022-03-29 NOTE — Progress Notes (Signed)
[facility] PREP Weekly Session  Patient Details  Name: Caitlin Rogers MRN: [id number] Date of Birth: September 23, 1955 Age: 66 y.o. PCP: Lesleigh Noe, MD  There were no vitals filed for this visit.   [facility] Weekly seesion - 03/29/22 1500       [facility] "PREP" Location   [facility] "PREP" Location [name]      Weekly Session   Topic Discussed Other   Portion control, control your portion size demo; food label review   Classes attended to date [place] 03/29/2022, 3:49 PM

## 2022-04-03 ENCOUNTER — Encounter: Payer: Self-pay | Admitting: *Deleted

## 2022-04-03 NOTE — Progress Notes (Signed)
[facility] PREP Weekly Session  Patient Details  Name: Caitlin Rogers MRN: [id number] Date of Birth: [birth year] Age: 66 y.o. PCP: Lesleigh Noe, MD  Vitals:   04/03/22 1555  Weight: 171 lb (77.6 kg)     [facility] Weekly seesion - 04/03/22 1500       [facility] "PREP" Location   [facility] "PREP" Location [name]      Weekly Session   Topic Discussed Finding support   Ways to be maintain accountability,importance of positive social support   Minutes exercised this week 270 minutes    Classes attended to date [place], [place] 04/03/2022, 3:57 PM

## 2022-04-10 ENCOUNTER — Encounter (HOSPITAL_BASED_OUTPATIENT_CLINIC_OR_DEPARTMENT_OTHER): Payer: Self-pay

## 2022-04-10 DIAGNOSIS — C44319 Basal cell carcinoma of skin of other parts of face: Secondary | ICD-10-CM | POA: Diagnosis not present

## 2022-04-10 MED ORDER — HYDROCHLOROTHIAZIDE 12.5 MG PO CAPS: 12.5000 mg | ORAL_CAPSULE | Freq: Every day | ORAL | 3 refills | Status: DC

## 2022-04-10 NOTE — Telephone Encounter (Signed)
Continue Olmesartan '5mg'$  QD. Start HCTZ 12.'5mg'$  QD.  Elevate legs, wear compression stockings.  MyChart message check in in 2 weeks.   TY! Loel Dubonnet, NP

## 2022-04-10 NOTE — Telephone Encounter (Signed)
Please advise 

## 2022-04-14 ENCOUNTER — Encounter (HOSPITAL_BASED_OUTPATIENT_CLINIC_OR_DEPARTMENT_OTHER): Payer: Self-pay | Admitting: Nurse Practitioner

## 2022-04-14 ENCOUNTER — Ambulatory Visit (INDEPENDENT_AMBULATORY_CARE_PROVIDER_SITE_OTHER): Payer: BC Managed Care – PPO | Admitting: Nurse Practitioner

## 2022-04-14 VITALS — BP 131/54 | HR 90 | Ht 63.0 in | Wt 166.0 lb

## 2022-04-14 DIAGNOSIS — F419 Anxiety disorder, unspecified: Secondary | ICD-10-CM | POA: Diagnosis not present

## 2022-04-14 DIAGNOSIS — M2550 Pain in unspecified joint: Secondary | ICD-10-CM | POA: Diagnosis not present

## 2022-04-14 DIAGNOSIS — I10 Essential (primary) hypertension: Secondary | ICD-10-CM | POA: Diagnosis not present

## 2022-04-14 DIAGNOSIS — Z0000 Encounter for general adult medical examination without abnormal findings: Secondary | ICD-10-CM

## 2022-04-14 DIAGNOSIS — F321 Major depressive disorder, single episode, moderate: Secondary | ICD-10-CM

## 2022-04-14 DIAGNOSIS — F325 Major depressive disorder, single episode, in full remission: Secondary | ICD-10-CM

## 2022-04-14 MED ORDER — ALPRAZOLAM 0.5 MG PO TABS: ORAL_TABLET | ORAL | 0 refills | Status: DC

## 2022-04-14 NOTE — Progress Notes (Signed)
Orma Render, DNP, AGNP-c Primary Care & Sports Medicine [address]  [place], [place] [address] [phone]  New patient visit   Patient: Caitlin Rogers   DOB: September 13, 1955   66 y.o. Female  MRN: [id number] Visit Date: 04/14/2022  Patient Care Team: Lesleigh Noe, MD as PCP - General (Family Medicine) Skeet Latch, MD as PCP - Cardiology (Cardiology) Ladene Artist, MD as Consulting Physician (Gastroenterology)  Today's Vitals   04/14/22 0949  BP: (!) 131/54  Pulse: 90  SpO2: 99%  Weight: 166 lb (75.3 kg)  Height: '5\' 3"'$  (1.6 m)   Body mass index is 29.41 kg/m.   Today's healthcare provider: Orma Render, NP   Chief Complaint  Patient presents with   New Patient (Initial Visit)    Patient presents today to establish care. She would like a script for xanax.    Subjective    Caitlin Rogers is a 66 y.o. female who presents today as a new patient to establish care.    Patient endorses the following concerns presently: She tells me she has had a lot of cortisone shots and she tells me that she feels that she may have a cortisone flair. Last shot was about 3 weeks ago. This was the second shot she had in a short period of time. She tells me now she is hurting all over.   She tells me that she has some bruising that she feels may be triggered by the steroid shots. She also feels like her blood pressure issues may be elevated because of this.   She tells me she has had a very stressful year with many surgeries and health issues.  She typically gets 23 xanax a year from her former PCP. She tells me that she feels like she would do better with a total of 90. She would like to know if I would be willing to prescribe this number per year for her.    History reviewed and reveals the following: Past Medical History:  Diagnosis Date   Anxiety    Aortic atherosclerosis ([place]) 09/29/2020   On coronary CT    Arthritis    Atypical chest  pain 11/11/2016   Cardiac murmur 01/19/2020   Cervical radiculopathy 02/13/2020   Chest discomfort 01/19/2020   Coronary artery calcification 10/07/2020   COVID-19 06/2019   Current moderate episode of major depressive disorder without prior episode ([place]) 07/06/2020   Dermatitis 11/01/2020   Essential hypertension 11/11/2016   GERD (gastroesophageal reflux disease) 11/11/2016   Hyperlipemia    Hyperlipidemia 11/11/2016   Hypertension    Numbness 01/02/2020   Other spondylosis with myelopathy, cervical region 01/19/2020   Palpitations 11/11/2016   Ringing in the ears    post covid symptom   Past Surgical History:  Procedure Laterality Date   APPENDECTOMY     BICEPT TENODESIS Left 10/28/2021   Procedure: BICEPS TENODESIS;  Surgeon: Leim Fabry, MD;  Location: [facility] OR[facility];  Service: Orthopedics;  Laterality: Left;   BUNIONECTOMY     bilat   CERVICAL SPINE SURGERY     CESAREAN SECTION     in toe   COLONOSCOPY  03/10/2016   Dr.Stark   MOUTH SURGERY  2019   implant with crown   POLYPECTOMY     REVERSE SHOULDER ARTHROPLASTY Left 10/28/2021   Procedure: Left reverse shoulder arthroplasty, biceps tenodesis;  Surgeon: Leim Fabry, MD;  Location: [facility] OR[facility];  Service: Orthopedics;  Laterality: Left;   SHOULDER ARTHROSCOPY  WITH ROTATOR CUFF REPAIR AND SUBACROMIAL DECOMPRESSION Left 07/01/2021   Procedure: Left shoulder arthroscopic subscapularis repair, mini-open supraspinatus repair, subacromial decompression, and bicepst tenodesis;  Surgeon: Leim Fabry, MD;  Location: [place];  Service: Orthopedics;  Laterality: Left;   TONSILLECTOMY     TUBAL LIGATION     UPPER GASTROINTESTINAL ENDOSCOPY     WISDOM TOOTH EXTRACTION     Family Status  Relation Name Status   Mother  Deceased at age 74   Father  Deceased at age 75   Sister  28   Sister  Alive   Sister  42   Sister  Alive   Brother  Deceased   Brother  Alive   Brother  Alive   Pat Aunt  Deceased at age 63   MGM   Deceased   MGF  Deceased   Caitlin Rogers  Deceased   PGF  Deceased   Daughter  Alive   Daughter  Alive   Neg Hx  (Not Specified)   Family History  Problem Relation Age of Onset   Hypertension Mother    Diabetes Mellitus II Mother    Hypertension Father    Stroke Father    Heart disease Father    Stroke Brother    Heart disease Brother    CAD Brother    Colon cancer Paternal Aunt        52's   Heart attack Paternal Grandfather    Colon polyps Neg Hx    Esophageal cancer Neg Hx    Rectal cancer Neg Hx    Stomach cancer Neg Hx    Social History   Socioeconomic History   Marital status: Married    Spouse name: Louis   Number of children: 2   Years of education: Some college   Highest education level: Not on file  Occupational History   Not on file  Tobacco Use   Smoking status: Former   Smokeless tobacco: Never  Scientific laboratory technician Use: Never used  Substance and Sexual Activity   Alcohol use: Yes    Alcohol/week: 21.0 standard drinks of alcohol    Types: 21 Glasses of wine per week   Drug use: Yes    Types: Marijuana   Sexual activity: Yes    Birth control/protection: Post-menopausal  Other Topics Concern   Not on file  Social History Narrative   07/06/20   From: moved around a kid, moved to [facility]   Living: with Louis, husband (2005)   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      Family: 2 children - Scientist, research (physical sciences) ([place]) and Judson Roch ([place])       Enjoys: nothing currently      Exercise: not currently   Diet: pretty good, limits sweets, tries to eat healthy food, leftovers for lunch      Safety   Seat belts: Yes    Guns: Yes  and secure   Safe in relationships: Yes    Social Determinants of Health   Financial Resource Strain: Not on file  Food Insecurity: Not on file  Transportation Needs: Not on file  Physical Activity: Not on file  Stress: Not on file  Social Connections: Not on file   Outpatient Medications Prior to Visit  Medication Sig   ALPRAZolam (XANAX) 0.5 MG  tablet TAKE ONE TABLET BY MOUTH TWICE A DAY AS NEEDED FOR ANXIETY   aspirin EC 81 MG tablet Take 81 mg by mouth daily. Swallow whole.   diclofenac Sodium (VOLTAREN)  1 % GEL Apply 1 application. topically 4 (four) times daily as needed (pain).   metoprolol succinate (TOPROL-XL) 25 MG 24 hr tablet TAKE ONE TABLET BY MOUTH ONE TIME DAILY   olmesartan (BENICAR) 5 MG tablet Take 1 tablet (5 mg total) by mouth daily.   omeprazole (PRILOSEC) 40 MG capsule TAKE ONE CAPSULE BY MOUTH ONE TIME DAILY   rosuvastatin (CRESTOR) 20 MG tablet TAKE ONE TABLET BY MOUTH ONE TIME DAILY   hydrochlorothiazide (MICROZIDE) 12.5 MG capsule Take 1 capsule (12.5 mg total) by mouth daily. (Patient not taking: Reported on 04/14/2022)   No facility-administered medications prior to visit.   Allergies  Allergen Reactions   Codeine Itching   Tramadol Itching   Immunization History  Administered Date(s) Administered   Influenza, Quadrivalent, Recombinant, Inj, Pf 03/15/2019   Influenza,inj,Quad PF,6+ Mos 05/23/2021   PFIZER(Purple Top)SARS-COV-2 Vaccination 10/11/2019, 11/01/2019, 05/14/2020   Pneumococcal Polysaccharide-23 03/15/2019    Review of Systems All review of systems negative except what is listed in the HPI   Objective    BP (!) 131/54   Pulse 90   Ht '5\' 3"'$  (1.6 m)   Wt 166 lb (75.3 kg)   SpO2 99%   BMI 29.41 kg/m  Physical Exam Vitals and nursing note reviewed.  Constitutional:      General: She is not in acute distress.    Appearance: Normal appearance.  Eyes:     Extraocular Movements: Extraocular movements intact.     Conjunctiva/sclera: Conjunctivae normal.     Pupils: Pupils are equal, round, and reactive to light.  Neck:     Vascular: No carotid bruit.  Cardiovascular:     Rate and Rhythm: Normal rate and regular rhythm.     Pulses: Normal pulses.     Heart sounds: Normal heart sounds. No murmur heard. Pulmonary:     Effort: Pulmonary effort is normal.     Breath sounds: Normal  breath sounds. No wheezing.  Abdominal:     General: Bowel sounds are normal.     Palpations: Abdomen is soft.  Musculoskeletal:        General: Tenderness present. Normal range of motion.     Cervical back: Normal range of motion.     Right lower leg: No edema.     Left lower leg: No edema.     Comments: Multijoint tenderness present.  Skin:    General: Skin is warm and dry.     Capillary Refill: Capillary refill takes less than 2 seconds.  Neurological:     General: No focal deficit present.     Mental Status: She is alert and oriented to person, place, and time.  Psychiatric:        Mood and Affect: Mood normal.        Behavior: Behavior normal.        Thought Content: Thought content normal.        Judgment: Judgment normal.     No results found for any visits on 04/14/22.  Assessment & Plan      Problem List Items Addressed This Visit   None    No follow-ups on file.      Jayleene Glaeser, Coralee Pesa, NP, DNP, AGNP-C [facility] [place] Maintenance Due Health Maintenance Topics with due status: Overdue     Topic Date Due   TETANUS/TDAP Never done   PAP SMEAR-Modifier Never done   Zoster Vaccines- Shingrix Never done  COVID-19 Vaccine 07/09/2020   INFLUENZA VACCINE 02/21/2022    CPE Due  Labs Due

## 2022-04-14 NOTE — Patient Instructions (Addendum)
Thank you for choosing [place] at [facility] for your Primary Care needs. I am excited for the opportunity to partner with you to meet your health care goals. It was a pleasure meeting you today!  Recommendations from today's visit: I would like to get some labs today to see how things are looking. We will check for inflammation that could be triggered by the steroids. Fortunately this should ease off as time passes and the medication continues to get out of your system.  I do recommend lidocaine patches to see if this helps with the hip- below is the one I have used in the past and it does seem to help.     Information on diet, exercise, and health maintenance recommendations are listed below. This is information to help you be sure you are on track for optimal health and monitoring.   Please look over this and let us know if you have any questions or if you have completed any of the health maintenance outside of [place] so that we can be sure your records are up to date.  ___________________________________________________________ About Me: I am an Adult-Geriatric Nurse Practitioner with a background in caring for patients for more than 20 years with a strong intensive care background. I provide primary care and sports medicine services to patients age 28 and older within this office. My education had a strong focus on caring for the older adult population, which I am passionate about. I am also the director of the APP Fellowship with [facility].   My desire is to provide you with the best service through preventive medicine and supportive care. I consider you a part of the medical team and value your input. I work diligently to ensure that you are heard and your needs are met in a safe and effective manner. I want you to feel comfortable with me as your provider and want you to know that your health concerns are important to me.  For your information, our office  hours are: Monday, Tuesday, and Thursday 8:00 AM - 5:00 PM Wednesday and Friday 8:00 AM - 12:00 PM.   In my time away from the office I am teaching new APP's within the system and am unavailable, but my partner, Dr. Burnard Bunting is in the office for emergent needs.   If you have questions or concerns, please call our office at [phone] or send [place] a MyChart message and we will respond as quickly as possible.  ____________________________________________________________ MyChart:  For all urgent or time sensitive needs we ask that you please call the office to avoid delays. Our number is [phone]. MyChart is not constantly monitored and due to the large volume of messages a day, replies may take up to 72 business hours.  MyChart Policy: MyChart allows for you to see your visit notes, after visit summary, provider recommendations, lab and tests results, make an appointment, request refills, and contact your provider or the office for non-urgent questions or concerns. Providers are seeing patients during normal business hours and do not have built in time to review MyChart messages.  We ask that you allow a minimum of 3 business days for responses to [employer]. For this reason, please do not send urgent requests through [place]. Please call the office at [phone]. New and ongoing conditions may require a visit. We have virtual and in person visit available for your convenience.  Complex MyChart concerns may require a visit. Your provider may request you schedule  a virtual or in person visit to ensure we are providing the best care possible. MyChart messages sent after 11:00 AM on Friday will not be received by the provider until Monday morning.    Lab and Test Results: You will receive your lab and test results on MyChart as soon as they are completed and results have been sent by the lab or testing facility. Due to this service, you will receive your results BEFORE your provider.  I  review lab and tests results each morning prior to seeing patients. Some results require collaboration with other providers to ensure you are receiving the most appropriate care. For this reason, we ask that you please allow a minimum of 3-5 business days from the time the ALL results have been received for your provider to receive and review lab and test results and contact you about these.  Most lab and test result comments from the provider will be sent through [place]. Your provider may recommend changes to the plan of care, follow-up visits, repeat testing, ask questions, or request an office visit to discuss these results. You may reply directly to this message or call the office at [phone] to provide information for the provider or set up an appointment. In some instances, you will be called with test results and recommendations. Please let us know if this is preferred and we will make note of this in your chart to provide this for you.    If you have not heard a response to your lab or test results in 5 business days from all results returning to [place], please call the office to let us know. We ask that you please avoid calling prior to this time unless there is an emergent concern. Due to high call volumes, this can delay the resulting process.  After Hours: For all non-emergency after hours needs, please call the office at [phone] and select the option to reach the on-call provider service. On-call services are shared between multiple [place] offices and therefore it will not be possible to speak directly with your provider. On-call providers may provide medical advice and recommendations, but are unable to provide refills for maintenance medications.  For all emergency or urgent medical needs after normal business hours, we recommend that you seek care at the closest Urgent Care or Emergency Department to ensure appropriate treatment in a timely manner.  [facility] at  [place] has a 24 hour emergency room located on the ground floor for your convenience.   Urgent Concerns During the Business Day Providers are seeing patients from 8AM to [place] with a busy schedule and are most often not able to respond to non-urgent calls until the end of the day or the next business day. If you should have URGENT concerns during the day, please call and speak to the nurse or schedule a same day appointment so that we can address your concern without delay.   Thank you, again, for choosing me as your health care partner. I appreciate your trust and look forward to learning more about you.   Worthy Keeler, DNP, AGNP-c ___________________________________________________________  Health Maintenance Recommendations Screening Testing Mammogram Every 1 -2 years based on history and risk factors Starting at age 61 Pap Smear Ages 21-39 every 3 years Ages 35-65 every 5 years with HPV testing More frequent testing may be required based on results and history Colon Cancer Screening Every 1-10 years based on test performed, risk factors, and history Starting at age 11 Bone Density  Screening Every 2-10 years based on history Starting at age 28 for women Recommendations for men differ based on medication usage, history, and risk factors AAA Screening One time ultrasound Men 45-99 years old who have every smoked Lung Cancer Screening Low Dose Lung CT every 12 months Age 83-80 years with a 30 pack-year smoking history who still smoke or who have quit within the last 15 years  Screening Labs Routine  Labs: Complete Blood Count (CBC), Complete Metabolic Panel (CMP), Cholesterol (Lipid Panel) Every 6-12 months based on history and medications May be recommended more frequently based on current conditions or previous results Hemoglobin A1c Lab Every 3-12 months based on history and previous results Starting at age 65 or earlier with diagnosis of diabetes, high cholesterol, BMI  >26, and/or risk factors Frequent monitoring for patients with diabetes to ensure blood sugar control Thyroid Panel (TSH w/ T3 & T4) Every 6 months based on history, symptoms, and risk factors May be repeated more often if on medication HIV One time testing for all patients 40 and older May be repeated more frequently for patients with increased risk factors or exposure Hepatitis C One time testing for all patients 50 and older May be repeated more frequently for patients with increased risk factors or exposure Gonorrhea, Chlamydia Every 12 months for all sexually active persons 13-24 years Additional monitoring may be recommended for those who are considered high risk or who have symptoms PSA Men 67-17 years old with risk factors Additional screening may be recommended from age 52-69 based on risk factors, symptoms, and history  Vaccine Recommendations Tetanus Booster All adults every 10 years Flu Vaccine All patients 6 months and older every year COVID Vaccine All patients 12 years and older Initial dosing with booster May recommend additional booster based on age and health history HPV Vaccine 2 doses all patients age 38-26 Dosing may be considered for patients over 26 Shingles Vaccine (Shingrix) 2 doses all adults 22 years and older Pneumonia (Pneumovax 23) All adults 35 years and older May recommend earlier dosing based on health history Pneumonia (Prevnar 27) All adults 43 years and older Dosed 1 year after Pneumovax 23  Additional Screening, Testing, and Vaccinations may be recommended on an individualized basis based on family history, health history, risk factors, and/or exposure.  __________________________________________________________  Diet Recommendations for All Patients  I recommend that all patients maintain a diet low in saturated fats, carbohydrates, and cholesterol. While this can be challenging at first, it is not impossible and small changes can make  big differences.  Things to try: Decreasing the amount of soda, sweet tea, and/or juice to one or less per day and replace with water While water is always the first choice, if you do not like water you may consider adding a water additive without sugar to improve the taste other sugar free drinks Replace potatoes with a brightly colored vegetable at dinner Use healthy oils, such as canola oil or olive oil, instead of butter or hard margarine Limit your bread intake to two pieces or less a day Replace regular pasta with low carb pasta options Bake, broil, or grill foods instead of frying Monitor portion sizes  Eat smaller, more frequent meals throughout the day instead of large meals  An important thing to remember is, if you love foods that are not great for your health, you don't have to give them up completely. Instead, allow these foods to be a reward when you have done well. Allowing yourself to  still have special treats every once in a while is a nice way to tell yourself thank you for working hard to keep yourself healthy.   Also remember that every day is a new day. If you have a bad day and "fall off the wagon", you can still climb right back up and keep moving along on your journey!  We have resources available to help you!  Some websites that may be helpful include: www.http://carter.biz/  Www.VeryWellFit.com _____________________________________________________________  Activity Recommendations for All Patients  I recommend that all adults get at least 20 minutes of moderate physical activity that elevates your heart rate at least 5 days out of the week.  Some examples include: Walking or jogging at a pace that allows you to carry on a conversation Cycling (stationary bike or outdoors) Water aerobics Yoga Weight lifting Dancing If physical limitations prevent you from putting stress on your joints, exercise in a pool or seated in a chair are excellent options.  Do determine  your MAXIMUM heart rate for activity: YOUR AGE - 220 = MAX HeartRate   Remember! Do not push yourself too hard.  Start slowly and build up your pace, speed, weight, time in exercise, etc.  Allow your body to rest between exercise and get good sleep. You will need more water than normal when you are exerting yourself. Do not wait until you are thirsty to drink. Drink with a purpose of getting in at least 8, 8 ounce glasses of water a day plus more depending on how much you exercise and sweat.    If you begin to develop dizziness, chest pain, abdominal pain, jaw pain, shortness of breath, headache, vision changes, lightheadedness, or other concerning symptoms, stop the activity and allow your body to rest. If your symptoms are severe, seek emergency evaluation immediately. If your symptoms are concerning, but not severe, please let us know so that we can recommend further evaluation.

## 2022-04-15 LAB — COMPREHENSIVE METABOLIC PANEL
ALT: 21 IU/L (ref 0–32)
AST: 29 IU/L (ref 0–40)
Albumin/Globulin Ratio: 1.9 (ref 1.2–2.2)
Albumin: 4.5 g/dL (ref 3.9–4.9)
Alkaline Phosphatase: 77 IU/L (ref 44–121)
BUN/Creatinine Ratio: 28 (ref 12–28)
BUN: 23 mg/dL (ref 8–27)
Bilirubin Total: 0.4 mg/dL (ref 0.0–1.2)
CO2: 18 mmol/L — ABNORMAL LOW (ref 20–29)
Calcium: 9.9 mg/dL (ref 8.7–10.3)
Chloride: 95 mmol/L — ABNORMAL LOW (ref 96–106)
Creatinine, Ser: 0.81 mg/dL (ref 0.57–1.00)
Globulin, Total: 2.4 g/dL (ref 1.5–4.5)
Glucose: 90 mg/dL (ref 70–99)
Potassium: 3.8 mmol/L (ref 3.5–5.2)
Sodium: 140 mmol/L (ref 134–144)
Total Protein: 6.9 g/dL (ref 6.0–8.5)
eGFR: 81 mL/min/{1.73_m2} (ref >59)

## 2022-04-15 LAB — CBC WITH DIFF/PLATELET
Basophils Absolute: 0 10*3/uL (ref 0.0–0.2)
Basos: 1 %
EOS (ABSOLUTE): 0 10*3/uL (ref 0.0–0.4)
Eos: 1 %
Hematocrit: 33.4 % — ABNORMAL LOW (ref 34.0–46.6)
Hemoglobin: 11.3 g/dL (ref 11.1–15.9)
Immature Grans (Abs): 0 10*3/uL (ref 0.0–0.1)
Immature Granulocytes: 0 %
Lymphocytes Absolute: 1.4 10*3/uL (ref 0.7–3.1)
Lymphs: 34 %
MCH: 29.1 pg (ref 26.6–33.0)
MCHC: 33.8 g/dL (ref 31.5–35.7)
MCV: 86 fL (ref 79–97)
Monocytes Absolute: 0.5 10*3/uL (ref 0.1–0.9)
Monocytes: 12 %
Neutrophils Absolute: 2.1 10*3/uL (ref 1.4–7.0)
Neutrophils: 52 %
Platelets: 218 10*3/uL (ref 150–450)
RBC: 3.88 x10E6/uL (ref 3.77–5.28)
RDW: 13 % (ref 11.7–15.4)
WBC: 4 10*3/uL (ref 3.4–10.8)

## 2022-04-15 LAB — THYROID PANEL WITH TSH
Free Thyroxine Index: >4.8 (ref 1.2–4.9)
T3 Uptake Ratio: >56 % — ABNORMAL HIGH (ref 24–39)
T4, Total: 8.6 ug/dL (ref 4.5–12.0)
TSH: 1.62 u[IU]/mL (ref 0.450–4.500)

## 2022-04-15 LAB — LIPID PANEL
Chol/HDL Ratio: 2 ratio (ref 0.0–4.4)
Cholesterol, Total: 253 mg/dL — ABNORMAL HIGH (ref 100–199)
HDL: 127 mg/dL (ref >39)
LDL Chol Calc (NIH): 114 mg/dL — ABNORMAL HIGH (ref 0–99)
Triglycerides: 75 mg/dL (ref 0–149)
VLDL Cholesterol Cal: 12 mg/dL (ref 5–40)

## 2022-04-15 LAB — HEMOGLOBIN A1C
Est. average glucose Bld gHb Est-mCnc: 123 mg/dL
Hgb A1c MFr Bld: 5.9 % — ABNORMAL HIGH (ref 4.8–5.6)

## 2022-04-15 LAB — C-REACTIVE PROTEIN: CRP: 1 mg/L (ref 0–10)

## 2022-04-15 LAB — VITAMIN D 25 HYDROXY (VIT D DEFICIENCY, FRACTURES): Vit D, 25-Hydroxy: 38.3 ng/mL (ref 30.0–100.0)

## 2022-04-16 ENCOUNTER — Encounter: Payer: Self-pay | Admitting: Gastroenterology

## 2022-04-18 DIAGNOSIS — C44319 Basal cell carcinoma of skin of other parts of face: Secondary | ICD-10-CM | POA: Diagnosis not present

## 2022-04-21 ENCOUNTER — Encounter (HOSPITAL_BASED_OUTPATIENT_CLINIC_OR_DEPARTMENT_OTHER): Payer: Self-pay | Admitting: Nurse Practitioner

## 2022-04-22 DIAGNOSIS — F321 Major depressive disorder, single episode, moderate: Secondary | ICD-10-CM | POA: Insufficient documentation

## 2022-04-22 NOTE — Assessment & Plan Note (Signed)
Chronic.  Systolic blood pressure very slightly elevated in office today.  Recommend continuation of current medications.  We will obtain labs today for further evaluation and make recommendations as necessary.

## 2022-04-22 NOTE — Assessment & Plan Note (Signed)
Not currently on any daily medication therapy for management.  No alarm symptoms present at this time.  We will continue to monitor.

## 2022-04-22 NOTE — Assessment & Plan Note (Signed)
Chronic.  Patient declines offer for daily medication.  Previous PCP provided total of 60 tablets of 0.5 mg Xanax per year for patient.  Patient is requesting a total of 90 a year.  We will review PDMP and determine if refills are appropriate at this time.  If patient requires more than 90 tablets/year we will require her to obtain these from psychiatry or start daily therapy per office policy.

## 2022-04-22 NOTE — Assessment & Plan Note (Signed)
>>  ASSESSMENT AND PLAN FOR DEPRESSION, MAJOR, SINGLE EPISODE, COMPLETE REMISSION (HCC) WRITTEN ON 04/22/2022  8:09 PM BY Xuan Mateus E, NP  Not currently on any daily medication therapy for management.  No alarm symptoms present at this time.  We will continue to monitor.

## 2022-04-22 NOTE — Assessment & Plan Note (Signed)
Chronic.  Etiology unknown at this time.  Patient has recently had several steroid injections which she feels may have triggered these symptoms.  We will monitor labs today.  Fortunately if this is triggered by steroids it should resolve in the near future.

## 2022-04-24 ENCOUNTER — Encounter (HOSPITAL_BASED_OUTPATIENT_CLINIC_OR_DEPARTMENT_OTHER): Payer: Self-pay

## 2022-04-24 ENCOUNTER — Telehealth: Payer: Self-pay | Admitting: *Deleted

## 2022-04-24 MED ORDER — HYDROCHLOROTHIAZIDE 12.5 MG PO CAPS: 12.5000 mg | ORAL_CAPSULE | Freq: Every day | ORAL | 3 refills | Status: DC

## 2022-04-24 NOTE — Telephone Encounter (Signed)
BP log as requested 

## 2022-04-24 NOTE — Telephone Encounter (Signed)
Patient contacted me last week about inability to return to PREP Class due to several health related issues. We have been contact as she had been unable to attend the past 4 classes. This is the final week of PREP and she states she  will not be able to complete the program due to recent Mohs surgery. Patient has completed 8 educational classes and 5 exercise classes. She has expressed interest in repeating the program. I have recommended consulting her MD.

## 2022-04-27 ENCOUNTER — Encounter (HOSPITAL_BASED_OUTPATIENT_CLINIC_OR_DEPARTMENT_OTHER): Payer: Self-pay

## 2022-04-27 ENCOUNTER — Encounter (HOSPITAL_BASED_OUTPATIENT_CLINIC_OR_DEPARTMENT_OTHER): Payer: Self-pay | Admitting: Nurse Practitioner

## 2022-04-27 DIAGNOSIS — H903 Sensorineural hearing loss, bilateral: Secondary | ICD-10-CM | POA: Diagnosis not present

## 2022-04-27 NOTE — Telephone Encounter (Signed)
FYI

## 2022-05-01 DIAGNOSIS — H903 Sensorineural hearing loss, bilateral: Secondary | ICD-10-CM | POA: Diagnosis not present

## 2022-05-02 ENCOUNTER — Ambulatory Visit (INDEPENDENT_AMBULATORY_CARE_PROVIDER_SITE_OTHER): Payer: BC Managed Care – PPO | Admitting: Cardiovascular Disease

## 2022-05-02 ENCOUNTER — Encounter (HOSPITAL_BASED_OUTPATIENT_CLINIC_OR_DEPARTMENT_OTHER): Payer: Self-pay | Admitting: Cardiovascular Disease

## 2022-05-02 VITALS — BP 126/81 | HR 98 | Ht 63.0 in | Wt 174.2 lb

## 2022-05-02 DIAGNOSIS — I700 Atherosclerosis of aorta: Secondary | ICD-10-CM

## 2022-05-02 DIAGNOSIS — Z5181 Encounter for therapeutic drug level monitoring: Secondary | ICD-10-CM | POA: Diagnosis not present

## 2022-05-02 DIAGNOSIS — I10 Essential (primary) hypertension: Secondary | ICD-10-CM

## 2022-05-02 DIAGNOSIS — E782 Mixed hyperlipidemia: Secondary | ICD-10-CM

## 2022-05-02 DIAGNOSIS — I2584 Coronary atherosclerosis due to calcified coronary lesion: Secondary | ICD-10-CM

## 2022-05-02 DIAGNOSIS — I2510 Atherosclerotic heart disease of native coronary artery without angina pectoris: Secondary | ICD-10-CM

## 2022-05-02 MED ORDER — ROSUVASTATIN CALCIUM 40 MG PO TABS: 40.0000 mg | ORAL_TABLET | Freq: Every day | ORAL | 3 refills | Status: DC

## 2022-05-02 NOTE — Patient Instructions (Signed)
Medication Instructions:  INCREASE YOUR ROSUVASTATIN TO 40 MG DAILY   *If you need a refill on your cardiac medications before your next appointment, please call your pharmacy*  Lab Work: LP/CMET IN 2-3 MONTHS   Testing/Procedures: NONE  Follow-Up: At [facility], you and your health needs are our priority.  As part of our continuing mission to provide you with exceptional heart care, we have created designated Provider Care Teams.  These Care Teams include your primary Cardiologist (physician) and Advanced Practice Providers (APPs -  Physician Assistants and Nurse Practitioners) who all work together to provide you with the care you need, when you need it.  We recommend signing up for the patient portal called "MyChart".  Sign up information is provided on this After Visit Summary.  MyChart is used to connect with patients for Virtual Visits (Telemedicine).  Patients are able to view lab/test results, encounter notes, upcoming appointments, etc.  Non-urgent messages can be sent to your provider as well.   To learn more about what you can do with MyChart, go to [email or web address].    Your next appointment:   6 month(s)  The format for your next appointment:   In Person  Provider:   Skeet Latch, MD    PAM FROM PREP WILL REACH OUT TO YOU

## 2022-05-02 NOTE — Assessment & Plan Note (Signed)
She has a calcium score in the 94th percentile. Reviewed CT images with Caitlin Rogers today.  We discussed the importance of being more aggressive with prevention.  LDL goal is <70.  Increase rosuvastatin to '40mg'$ . Repeat lipids/CMP in 2-3 months.  She is holding ASA now.  Resume after her Mohs procedure has healed.  She started in PREP and had to stop 2/2 surgery.  Will re-refer.

## 2022-05-02 NOTE — Assessment & Plan Note (Signed)
BP has been labile but better controlled.  No longer having hypotensive episodes.  She is tolerating metoprolol, HCTZ and olmesartan.

## 2022-05-02 NOTE — Assessment & Plan Note (Signed)
LDL goal <70.  Increase rosuvastatin.  Check fasting lipids/CMP in 2-3 months.

## 2022-05-02 NOTE — Progress Notes (Signed)
Cardiology Office Note  Date:  05/02/2022   ID:  Caitlin Rogers, DOB August 27, 1955, MRN [id number]  PCP:  Orma Render, NP  Cardiologist:   Skeet Latch, MD   No chief complaint on file.   History of Present Illness: Caitlin Rogers is a 66 y.o. female with coronary calcification, hypertension, hyperlipidemia, prior tobacco abuse, and gastroesophageal reflux disease who is being seen for follow up. She was first seen 11/10/2016 for the evaluation of palpitations at the request of Caitlin Noe, MD.  Ms. Bently saw Dr. Ernie Hew on 10/10/16. She reported an episode of palpitations with a heart rate in the 160s.  She moved here from [place]. She established care with Dr. Ernie Hew in 2018 and her blood pressure and cholesterol were both elevated.  Her metoprolol was discontinued and she was started on losartan.   The episode occurred soon after stopping metoprolol and lasted for several hours.  It was associated with shortness of breath but no chest pain. She had been taking metoprolol for many years after she had an episode of palpitations while being treated for H pylori.     She reported sporadic episodes of chest tightness ongoing for a month which she thought could be related to pollen allergies. She noted having mild exertional shortness of breath. Her blood pressure was 440N-027O systolic at home. Metoprolol succinate was resumed at 25 mg daily. An ETT was ordered to evaluate for ischemia. She was not seen again by cardiology until she saw Dr. Geraldo Rogers in 2021 and reported palpitations. She wore a monitor that showed rare PAC's and PVC's. Nuclear stress test revealed LVEF >65% and no ischemia. She had mild LVH on Echo but it was otherwise unremarkable. She saw Dr. Geraldo Rogers on 01/21/2021 and he noted that her LFTs were elevated. He recommended to decrease alcohol intake and increase exercise.   At the last appointment her blood pressure was high in the office but controlled at home. She was asked to  reduce her NSAIDs. She wanted to work on diet and exercise rather than increase her statin. She followed up with Laurann Montana, NP on 01/2022 and her blood pressure was 114/60. She had some hypotensive episodes so olmesartan-HCTZ was reduced to 20-12.5 mg. She called the office and reported fatigue, so she was advised to take 1/2 tablet of her olmesartan-HCTZ. After one week she presented a blood pressure log via MyChart, which showed more frequent low blood pressures. Her olmesartan-HCTZ 20-12.5 mg was switched to olmesartan 5 mg daily without the HCTZ component. On 04/10/22 she reported higher blood pressures and ankle edema, so she was started on HCTZ 12.5 mg QD.  Today, she reports undergoing Mohs' surgery about 2 weeks ago. At one point she developed dehiscence of her wound; earlier today she had new stitching placed. She has been instructed to limit her activity and formal exercise. Due to her health issues she was unable to complete the PREP program. She would be interested in a new referral. After starting her diuretic her blood pressures are averaging around 536-644 systolic. Of note, she endorses easy bleeding. At this time she is not taking the 81 mg aspirin. As of last month, her LDL was 114. She denies any palpitations, chest pain, shortness of breath, or peripheral edema. No lightheadedness, headaches, syncope, orthopnea, or PND.  Past Medical History:  Diagnosis Date   Allergy 2015   Itchy   Anxiety    Aortic atherosclerosis ([place]) 09/29/2020   On coronary CT  Arthritis    Atypical chest pain 11/11/2016   Cancer ([place]) 03/2022   Basal carcinoma mohs 04/18/22   Cardiac murmur 01/19/2020   Cataract 2023   Removed both eyes   Cervical radiculopathy 02/13/2020   Chest discomfort 01/19/2020   Coronary artery calcification 10/07/2020   COVID-19 06/2019   Current moderate episode of major depressive disorder without prior episode ([place]) 07/06/2020   Dermatitis 11/01/2020   Essential  hypertension 11/11/2016   GERD (gastroesophageal reflux disease) 11/11/2016   Hyperlipemia    Hyperlipidemia 11/11/2016   Hypertension    Numbness 01/02/2020   Other spondylosis with myelopathy, cervical region 01/19/2020   Palpitations 11/11/2016   Ringing in the ears    post covid symptom    Past Surgical History:  Procedure Laterality Date   APPENDECTOMY     BICEPT TENODESIS Left 10/28/2021   Procedure: BICEPS TENODESIS;  Surgeon: Caitlin Fabry, MD;  Location: [facility] ORS;  Service: Orthopedics;  Laterality: Left;   BUNIONECTOMY     bilat   CERVICAL SPINE SURGERY     CESAREAN SECTION     in toe   COLONOSCOPY  03/10/2016   Dr.Stark   JOINT REPLACEMENT  2023   Left shoulder   MOUTH SURGERY  2019   implant with crown   POLYPECTOMY     REVERSE SHOULDER ARTHROPLASTY Left 10/28/2021   Procedure: Left reverse shoulder arthroplasty, biceps tenodesis;  Surgeon: Caitlin Fabry, MD;  Location: [facility] ORS;  Service: Orthopedics;  Laterality: Left;   SHOULDER ARTHROSCOPY WITH ROTATOR CUFF REPAIR AND SUBACROMIAL DECOMPRESSION Left 07/01/2021   Procedure: Left shoulder arthroscopic subscapularis repair, mini-open supraspinatus repair, subacromial decompression, and bicepst tenodesis;  Surgeon: Caitlin Fabry, MD;  Location: [facility];  Service: Orthopedics;  Laterality: Left;   SPINE SURGERY  2015   TONSILLECTOMY     TUBAL LIGATION     UPPER GASTROINTESTINAL ENDOSCOPY     WISDOM TOOTH EXTRACTION       Current Outpatient Medications  Medication Sig Dispense Refill   ALPRAZolam (XANAX) 0.5 MG tablet One tablet by mouth no more than twice a day for severe anxiety. 60 tablet 0   diclofenac Sodium (VOLTAREN) 1 % GEL Apply 1 application. topically 4 (four) times daily as needed (pain).     hydrochlorothiazide (MICROZIDE) 12.5 MG capsule Take 1 capsule (12.5 mg total) by mouth daily. 90 capsule 3   metoprolol succinate (TOPROL-XL) 25 MG 24 hr tablet TAKE ONE TABLET BY MOUTH ONE TIME DAILY  90 tablet 3   olmesartan (BENICAR) 5 MG tablet Take 1 tablet (5 mg total) by mouth daily. 30 tablet 2   omeprazole (PRILOSEC) 40 MG capsule TAKE ONE CAPSULE BY MOUTH ONE TIME DAILY 90 capsule 0   aspirin EC 81 MG tablet Take 81 mg by mouth daily. Swallow whole. (Patient not taking: Reported on 05/02/2022)     rosuvastatin (CRESTOR) 40 MG tablet Take 1 tablet (40 mg total) by mouth daily. 90 tablet 3   No current facility-administered medications for this visit.    Allergies:   Codeine and Tramadol    Social History:  The patient  reports that she quit smoking about 9 years ago. Her smoking use included cigarettes. She has a 7.50 pack-year smoking history. She has never used smokeless tobacco. She reports current alcohol use of about 6.0 standard drinks of alcohol per week. She reports current drug use. Drug: Marijuana.   Family History:  The patient's family history includes Alcohol abuse in her brother, father, and  mother; Anxiety disorder in her mother; Arthritis in her mother; CAD in her brother; Colon cancer in her paternal aunt; Diabetes in her father and mother; Diabetes Mellitus II in her mother; Early death in her mother; Heart attack in her paternal grandfather; Heart disease in her brother, brother, father, and mother; Hyperlipidemia in her brother, brother, father, and mother; Hypertension in her brother, father, and mother; Obesity in her mother; Stroke in her brother and father.   ROS:   Please see the history of present illness. (+) Easy bleeding All other systems are reviewed and negative.    PHYSICAL EXAM: VS:  BP 126/81 (BP Location: Left Arm, Patient Position: Sitting, Cuff Size: Normal)   Pulse 98   Ht '5\' 3"'$  (1.6 m)   Wt 174 lb 3.2 oz (79 kg)   SpO2 97%   BMI 30.86 kg/m  , BMI Body mass index is 30.86 kg/m. GENERAL:  Well appearing HEENT:  Pupils equal round and reactive, fundi not visualized, oral mucosa unremarkable  NECK:  No jugular venous distention, waveform  within normal limits, carotid upstroke brisk and symmetric, no bruits, no thyromegaly LUNGS:  Clear to auscultation bilaterally HEART:  RRR.  PMI not displaced or sustained,S1 and S2 within normal limits, no S3, no S4, no clicks, no rubs, 1/6 systolic murmur. ABD:  Flat, positive bowel sounds normal in frequency in pitch, no bruits, no rebound, no guarding, no midline pulsatile mass, no hepatomegaly, no splenomegaly EXT:  2 plus pulses throughout, no edema, no cyanosis no clubbing SKIN:  No rashes no nodules NEURO:  Cranial nerves II through XII grossly intact, motor grossly intact throughout PSYCH:  Cognitively intact, oriented to person place and time  EKG: EKG is personally reviewed. 05/02/2022:  EKG was not ordered. 12/28/2021: EKG was not ordered. 11/10/2016: sinus rhythm. Rate 64 bpm.   Coronary Calcium Score 09/29/2020 FINDINGS: Non-cardiac: See separate report from [facility].   Ascending Aorta: Normal caliber. Calcification noted in the descending aorta.   Pericardium: Normal   Coronary arteries: Normal origins and anatomy. Right dominant. Calcification note din all three coronary distributions.   IMPRESSION: Coronary calcium score of 225. This was 92nd percentile for age and sex matched control.   Recommend aggressive risk factor modification including LDL goal <70.  Monitor 01/2020: EVENT MONITOR REPORT: Patient was monitored from 01/19/2020 to 02/02/2020. Indication:                    Palpitations Ordering physician:  Jenean Lindau, MD  Referring physician:        Jenean Lindau, MD    Baseline rhythm: Sinus   Minimum heart rate: 58 BPM.  Average heart rate: 80 BPM.  Maximal heart rate 116 BPM.   Atrial arrhythmia: Rare PACs   Ventricular arrhythmia: Rare PVCs   Conduction abnormality: None significant   Symptoms: None significant   Conclusion:  Unremarkable event monitor  Lexiscan Myoview  02/16/2020 Nuclear stress EF: 90%. There was no ST  segment deviation noted during stress. The study is normal. This is a low risk study. The left ventricular ejection fraction is hyperdynamic (>65%).   Normal resting and stress perfusion. No ischemia or infarction EF Estimated at 90% May not be that high but is normal   Renal Artery Doppler 04/04/2019 IMPRESSION: 1. No evidence of renal artery stenosis. 2. Normal sonographic appearance of the kidneys. No evidence of hydronephrosis. 3. Hepatic steatosis.     Recent Labs: 04/14/2022: ALT 21; BUN 23; Creatinine,  Ser 0.81; Hemoglobin 11.3; Platelets 218; Potassium 3.8; Sodium 140; TSH 1.620   07/29/16: WBC 3.2, hemoglobin 13.3, hematocrit 39.4, platelets 214 Sodium 145, potassium 4.6, BUN 11, creatinine 0.58 AST 27, ALT 17 TSH 2.37 Total cholesterol 266, triglycerides 256, HDL 71, LDL 144  Lipid Panel    Component Value Date/Time   CHOL 253 (H) 04/14/2022 1057   TRIG 75 04/14/2022 1057   HDL 127 04/14/2022 1057   CHOLHDL 2.0 04/14/2022 1057   CHOLHDL 2 06/14/2021 0903   VLDL 29.8 06/14/2021 0903   LDLCALC 114 (H) 04/14/2022 1057      Wt Readings from Last 3 Encounters:  05/02/22 174 lb 3.2 oz (79 kg)  04/14/22 166 lb (75.3 kg)  04/03/22 171 lb (77.6 kg)     ASSESSMENT AND PLAN:  Aortic atherosclerosis (HCC) LDL goal <70.  Increase rosuvastatin.  Check fasting lipids/CMP in 2-3 months.   Coronary artery calcification She has a calcium score in the 94th percentile. Reviewed CT images with Ms. Scoggin today.  We discussed the importance of being more aggressive with prevention.  LDL goal is <70.  Increase rosuvastatin to '40mg'$ . Repeat lipids/CMP in 2-3 months.  She is holding ASA now.  Resume after her Mohs procedure has healed.  She started in PREP and had to stop 2/2 surgery.  Will re-refer.  Essential hypertension BP has been labile but better controlled.  No longer having hypotensive episodes.  She is tolerating metoprolol, HCTZ and olmesartan.   Disposition:    FU  with Kaisley Stiverson C. Oval Linsey, MD, [facility] in 6 months.   Current medicines are reviewed at length with the patient today.  The patient does not have concerns regarding medicines.  The following changes have been made:  Restart metoprolol 25 mg daily.    Labs/ tests ordered today include:   Orders Placed This Encounter  Procedures   Lipid panel   Comprehensive metabolic panel   Amb Referral To Provider Referral Exercise Program (P.R.E.P)     I,Mathew Stumpf,acting as a scribe for Skeet Latch, MD.,have documented all relevant documentation on the behalf of Skeet Latch, MD,as directed by  Skeet Latch, MD while in the presence of Skeet Latch, MD.  I, [place] Oval Linsey, MD have reviewed all documentation for this visit.  The documentation of the exam, diagnosis, procedures, and orders on 05/02/2022 are all accurate and complete.  Signed, [place]a Artola C. Oval Linsey, MD, [facility]  05/02/2022 5:48 PM    [place]

## 2022-05-12 ENCOUNTER — Other Ambulatory Visit (HOSPITAL_BASED_OUTPATIENT_CLINIC_OR_DEPARTMENT_OTHER): Payer: Self-pay | Admitting: Family

## 2022-05-12 DIAGNOSIS — I10 Essential (primary) hypertension: Secondary | ICD-10-CM

## 2022-05-12 NOTE — Telephone Encounter (Signed)
Rx request sent to pharmacy.  

## 2022-05-17 DIAGNOSIS — M25552 Pain in left hip: Secondary | ICD-10-CM | POA: Diagnosis not present

## 2022-05-17 DIAGNOSIS — M25551 Pain in right hip: Secondary | ICD-10-CM | POA: Diagnosis not present

## 2022-05-17 DIAGNOSIS — Z96612 Presence of left artificial shoulder joint: Secondary | ICD-10-CM | POA: Diagnosis not present

## 2022-05-19 ENCOUNTER — Other Ambulatory Visit: Payer: Self-pay | Admitting: Orthopedic Surgery

## 2022-05-19 DIAGNOSIS — M25551 Pain in right hip: Secondary | ICD-10-CM

## 2022-05-31 ENCOUNTER — Ambulatory Visit
Admission: RE | Admit: 2022-05-31 | Discharge: 2022-05-31 | Disposition: A | Discharge status: Home / Self Care | Payer: BC Managed Care – PPO | Source: Ambulatory Visit | Attending: Orthopedic Surgery | Admitting: Orthopedic Surgery

## 2022-05-31 DIAGNOSIS — M87851 Other osteonecrosis, right femur: Secondary | ICD-10-CM | POA: Diagnosis not present

## 2022-05-31 DIAGNOSIS — M24151 Other articular cartilage disorders, right hip: Secondary | ICD-10-CM | POA: Diagnosis not present

## 2022-05-31 DIAGNOSIS — R600 Localized edema: Secondary | ICD-10-CM | POA: Diagnosis not present

## 2022-05-31 DIAGNOSIS — S73191A Other sprain of right hip, initial encounter: Secondary | ICD-10-CM | POA: Diagnosis not present

## 2022-05-31 DIAGNOSIS — M25551 Pain in right hip: Secondary | ICD-10-CM

## 2022-05-31 DIAGNOSIS — M25552 Pain in left hip: Secondary | ICD-10-CM | POA: Diagnosis not present

## 2022-06-01 DIAGNOSIS — M25551 Pain in right hip: Secondary | ICD-10-CM | POA: Diagnosis not present

## 2022-06-01 DIAGNOSIS — M25552 Pain in left hip: Secondary | ICD-10-CM | POA: Diagnosis not present

## 2022-06-07 ENCOUNTER — Telehealth: Payer: Self-pay | Admitting: Family

## 2022-06-07 NOTE — Telephone Encounter (Signed)
Will forward to Catilin W and Dr Oval Linsey for review   [facility] to refer?

## 2022-06-07 NOTE — Telephone Encounter (Signed)
Patient would like to be referred to a an ortho doctor for a hip replacement.

## 2022-06-07 NOTE — Telephone Encounter (Signed)
Patient called back stating she has found an ortho Psychologist, sport and exercise.  Patient wanted to thank the nurses for their efforts in checking on a referral for her.

## 2022-06-20 ENCOUNTER — Telehealth (HOSPITAL_BASED_OUTPATIENT_CLINIC_OR_DEPARTMENT_OTHER): Payer: Self-pay | Admitting: Family Medicine

## 2022-06-20 ENCOUNTER — Telehealth (HOSPITAL_BASED_OUTPATIENT_CLINIC_OR_DEPARTMENT_OTHER): Payer: Self-pay

## 2022-06-20 DIAGNOSIS — M160 Bilateral primary osteoarthritis of hip: Secondary | ICD-10-CM | POA: Diagnosis not present

## 2022-06-20 NOTE — Telephone Encounter (Signed)
   Name: Caitlin Rogers  DOB: Jul 29, 1955  MRN: [id number]  Primary Cardiologist: Skeet Latch, MD   Preoperative team, please contact this patient and set up a phone call appointment for further preoperative risk assessment. Please obtain consent and complete medication review. Thank you for your help.  I confirm that guidance regarding antiplatelet and oral anticoagulation therapy has been completed and, if necessary, noted below.   Ledora Bottcher, PA 06/20/2022, [place]

## 2022-06-20 NOTE — Telephone Encounter (Signed)
   Pre-operative Risk Assessment    Patient Name: Caitlin Rogers  DOB: [birth year] MRN: [id number]      Request for Surgical Clearance    Procedure:   Right Total Hip Arthroplasty  Date of Surgery:  Clearance TBD                                 Surgeon:  Dr. Rod Can Surgeon's Group or Practice Name:  [facility] Phone number:  [phone] Fax number:  [phone]   Type of Clearance Requested:   - Medical    Type of Anesthesia:  Spinal   Additional requests/questions:   None  SignedFrancella Solian   06/20/2022, 10:59 AM

## 2022-06-20 NOTE — Telephone Encounter (Signed)
Pt bring in Surgery Clearance from   [facility] - Dr Lyla Glassing Right total Hip Arthoplasty  Placed in provider box in sleeve

## 2022-06-20 NOTE — Telephone Encounter (Signed)
CANCEL REQUEST    Pt is going to [place]

## 2022-06-21 ENCOUNTER — Encounter (HOSPITAL_BASED_OUTPATIENT_CLINIC_OR_DEPARTMENT_OTHER): Payer: Self-pay

## 2022-06-21 NOTE — Telephone Encounter (Signed)
Discussed via phone. See documentation on preop phone call.   Loel Dubonnet, NP

## 2022-06-21 NOTE — Telephone Encounter (Signed)
   Name: Caitlin Rogers  DOB: 10-30-1955  MRN: [id number]   Primary Cardiologist: Skeet Latch, MD  Chart reviewed as part of pre-operative protocol coverage. Phone call as I have seen patient previously and she is requesting to try to have surgery done before end of year.   Patient was contacted 06/21/2022 in reference to pre-operative risk assessment for pending surgery as outlined below.  Caitlin Rogers was last seen on 05/02/22 by Dr. Oval Linsey.  Since that day, Caitlin Rogers has done well. No chest pain, dyspnea. Maintains exercise tolerance >4 METS.  Per office protocol may hold Aspirin 5-7 days prior to planned procedure.   Therefore, based on ACC/AHA guidelines, the patient would be at acceptable risk for the planned procedure without further cardiovascular testing.   The patient was advised that if she develops new symptoms prior to surgery to contact our office to arrange for a follow-up visit, and she verbalized understanding.  I will route this recommendation to the requesting party via Epic fax function and remove from pre-op pool. Please call with questions.  Loel Dubonnet, NP 06/21/2022, 11:21 AM

## 2022-06-26 ENCOUNTER — Encounter: Payer: Self-pay | Admitting: Nurse Practitioner

## 2022-06-26 ENCOUNTER — Ambulatory Visit (INDEPENDENT_AMBULATORY_CARE_PROVIDER_SITE_OTHER): Payer: BC Managed Care – PPO | Admitting: Nurse Practitioner

## 2022-06-26 VITALS — BP 120/82 | HR 109 | Temp 98.4°F | Wt 172.4 lb

## 2022-06-26 DIAGNOSIS — I10 Essential (primary) hypertension: Secondary | ICD-10-CM

## 2022-06-26 DIAGNOSIS — Z01810 Encounter for preprocedural cardiovascular examination: Secondary | ICD-10-CM | POA: Diagnosis not present

## 2022-06-26 NOTE — Progress Notes (Signed)
  Worthy Keeler, DNP, AGNP-c [place]  [address] [place], [place] [address] [phone]  ESTABLISHED PATIENT- Chronic Health and/or Follow-Up Visit  Blood pressure 120/82, pulse (!) 109, temperature 98.4 F (36.9 C), weight 172 lb 6.4 oz (78.2 kg).    Caitlin Rogers is a 66 y.o. year old female presenting today for evaluation and management of the following: Surgery clearance Rt. Hip replacement at [facility] in the near future. Dr. Adrian Prows would like to try to get her surgery before the end of this year, has been in pain with the hip for over a year. Needs evaluation to ensure this can be performed.   All ROS negative with exception of what is listed above.   PHYSICAL EXAM Physical Exam Vitals and nursing note reviewed.  Constitutional:      Appearance: Normal appearance.  HENT:     Head: Normocephalic.     Nose: Nose normal.     Mouth/Throat:     Mouth: Mucous membranes are moist.     Pharynx: Oropharynx is clear.  Eyes:     Extraocular Movements: Extraocular movements intact.     Pupils: Pupils are equal, round, and reactive to light.  Neck:     Vascular: No carotid bruit.  Cardiovascular:     Rate and Rhythm: Normal rate and regular rhythm.     Pulses: Normal pulses.     Heart sounds: Murmur heard.  Pulmonary:     Effort: Pulmonary effort is normal.     Breath sounds: Normal breath sounds.  Abdominal:     General: Bowel sounds are normal. There is no distension.     Palpations: Abdomen is soft.     Tenderness: There is no abdominal tenderness. There is no guarding.  Musculoskeletal:     Cervical back: Normal range of motion.     Right lower leg: No edema.     Left lower leg: No edema.     Comments: Hip pain and decreased ROM present.   Lymphadenopathy:     Cervical: No cervical adenopathy.  Skin:    General: Skin is warm and dry.     Capillary Refill: Capillary refill takes less than 2 seconds.  Neurological:     General: No focal  deficit present.     Mental Status: She is alert and oriented to person, place, and time.     Cranial Nerves: No cranial nerve deficit.     Motor: No weakness.  Psychiatric:        Mood and Affect: Mood normal.        Behavior: Behavior normal.     PLAN Problem List Items Addressed This Visit     Essential hypertension    Well controlled. No alarm sx. No reason that surgery could not be completed as long as BP remains controlled. Continue current medications.       Encounter for pre-operative cardiovascular clearance - Primary    Examination WNL today. Chronic murmur detected on examination with no other associated CV symptoms at this time.  Her blood pressure is well-controlled on current medication regimen.  She is on statin therapy.  Based on examination today no concerning findings that would prohibit planned hip surgery.  Will send clearance.       Return if symptoms worsen or fail to improve.   Worthy Keeler, DNP, AGNP-c 06/26/2022  3:12 PM

## 2022-06-26 NOTE — Patient Instructions (Signed)
We will fax the signed paperwork over to the Orthopedic office today so he will have it ASAP.   Please let me know if you have any concerns.

## 2022-06-27 ENCOUNTER — Telehealth: Payer: Self-pay | Admitting: Cardiology

## 2022-06-27 ENCOUNTER — Ambulatory Visit (HOSPITAL_BASED_OUTPATIENT_CLINIC_OR_DEPARTMENT_OTHER): Payer: BC Managed Care – PPO | Admitting: Family Medicine

## 2022-06-27 NOTE — Telephone Encounter (Signed)
Spoke with patient and advised clearance sent and will mail copy of phone note

## 2022-06-27 NOTE — Telephone Encounter (Signed)
Patient calling to get a copy of her clearance letter. Please advise

## 2022-07-05 ENCOUNTER — Other Ambulatory Visit: Payer: Self-pay | Admitting: Gastroenterology

## 2022-07-06 ENCOUNTER — Other Ambulatory Visit (HOSPITAL_BASED_OUTPATIENT_CLINIC_OR_DEPARTMENT_OTHER): Payer: Self-pay | Admitting: Nurse Practitioner

## 2022-07-06 DIAGNOSIS — F321 Major depressive disorder, single episode, moderate: Secondary | ICD-10-CM

## 2022-07-06 DIAGNOSIS — F419 Anxiety disorder, unspecified: Secondary | ICD-10-CM

## 2022-07-07 ENCOUNTER — Telehealth: Payer: Self-pay | Admitting: Gastroenterology

## 2022-07-07 MED ORDER — OMEPRAZOLE 40 MG PO CPDR: 40.0000 mg | DELAYED_RELEASE_CAPSULE | Freq: Every day | ORAL | 0 refills | Status: DC

## 2022-07-07 NOTE — Telephone Encounter (Signed)
Prescription refill sent to patient's pharmacy until scheduled appt.

## 2022-07-07 NOTE — Telephone Encounter (Signed)
Patient is calling wishing for an Omeprazole refill to hold her off until 2/6 appt. Please advise

## 2022-07-11 ENCOUNTER — Other Ambulatory Visit: Payer: Self-pay | Admitting: Nurse Practitioner

## 2022-07-11 DIAGNOSIS — M1612 Unilateral primary osteoarthritis, left hip: Secondary | ICD-10-CM | POA: Diagnosis not present

## 2022-07-11 DIAGNOSIS — Z1231 Encounter for screening mammogram for malignant neoplasm of breast: Secondary | ICD-10-CM

## 2022-07-11 DIAGNOSIS — M25552 Pain in left hip: Secondary | ICD-10-CM | POA: Diagnosis not present

## 2022-07-18 ENCOUNTER — Encounter: Payer: Self-pay | Admitting: Nurse Practitioner

## 2022-07-18 DIAGNOSIS — Z01810 Encounter for preprocedural cardiovascular examination: Secondary | ICD-10-CM

## 2022-07-18 DIAGNOSIS — Z01818 Encounter for other preprocedural examination: Secondary | ICD-10-CM | POA: Insufficient documentation

## 2022-07-18 HISTORY — DX: Encounter for preprocedural cardiovascular examination: Z01.810

## 2022-07-18 NOTE — Assessment & Plan Note (Signed)
Well controlled. No alarm sx. No reason that surgery could not be completed as long as BP remains controlled. Continue current medications.

## 2022-07-18 NOTE — Assessment & Plan Note (Signed)
Examination WNL today. Chronic murmur detected on examination with no other associated CV symptoms at this time.  Her blood pressure is well-controlled on current medication regimen.  She is on statin therapy.  Based on examination today no concerning findings that would prohibit planned hip surgery.  Will send clearance.

## 2022-07-20 DIAGNOSIS — M1611 Unilateral primary osteoarthritis, right hip: Secondary | ICD-10-CM | POA: Diagnosis not present

## 2022-07-21 ENCOUNTER — Encounter (HOSPITAL_BASED_OUTPATIENT_CLINIC_OR_DEPARTMENT_OTHER): Payer: Self-pay | Admitting: Cardiovascular Disease

## 2022-07-27 DIAGNOSIS — Z1283 Encounter for screening for malignant neoplasm of skin: Secondary | ICD-10-CM | POA: Diagnosis not present

## 2022-07-27 DIAGNOSIS — Z85828 Personal history of other malignant neoplasm of skin: Secondary | ICD-10-CM | POA: Diagnosis not present

## 2022-07-28 DIAGNOSIS — E782 Mixed hyperlipidemia: Secondary | ICD-10-CM | POA: Diagnosis not present

## 2022-07-28 DIAGNOSIS — Z5181 Encounter for therapeutic drug level monitoring: Secondary | ICD-10-CM | POA: Diagnosis not present

## 2022-07-28 DIAGNOSIS — I10 Essential (primary) hypertension: Secondary | ICD-10-CM | POA: Diagnosis not present

## 2022-07-28 LAB — COMPREHENSIVE METABOLIC PANEL
ALT: 34 IU/L — ABNORMAL HIGH (ref 0–32)
AST: 52 IU/L — ABNORMAL HIGH (ref 0–40)
Albumin/Globulin Ratio: 1.8 (ref 1.2–2.2)
Albumin: 4.2 g/dL (ref 3.9–4.9)
Alkaline Phosphatase: 70 IU/L (ref 44–121)
BUN/Creatinine Ratio: 20 (ref 12–28)
BUN: 16 mg/dL (ref 8–27)
Bilirubin Total: 0.2 mg/dL (ref 0.0–1.2)
CO2: 25 mmol/L (ref 20–29)
Calcium: 9.6 mg/dL (ref 8.7–10.3)
Chloride: 101 mmol/L (ref 96–106)
Creatinine, Ser: 0.79 mg/dL (ref 0.57–1.00)
Globulin, Total: 2.3 g/dL (ref 1.5–4.5)
Glucose: 80 mg/dL (ref 70–99)
Potassium: 3.6 mmol/L (ref 3.5–5.2)
Sodium: 142 mmol/L (ref 134–144)
Total Protein: 6.5 g/dL (ref 6.0–8.5)
eGFR: 83 mL/min/{1.73_m2} (ref >59)

## 2022-07-28 LAB — LIPID PANEL
Chol/HDL Ratio: 2 ratio (ref 0.0–4.4)
Cholesterol, Total: 190 mg/dL (ref 100–199)
HDL: 96 mg/dL (ref >39)
LDL Chol Calc (NIH): 77 mg/dL (ref 0–99)
Triglycerides: 98 mg/dL (ref 0–149)
VLDL Cholesterol Cal: 17 mg/dL (ref 5–40)

## 2022-08-01 ENCOUNTER — Encounter: Payer: Self-pay | Admitting: Internal Medicine

## 2022-08-04 ENCOUNTER — Telehealth: Payer: Self-pay

## 2022-08-04 DIAGNOSIS — M1611 Unilateral primary osteoarthritis, right hip: Secondary | ICD-10-CM | POA: Diagnosis not present

## 2022-08-04 NOTE — Telephone Encounter (Signed)
Called to discuss re-enrolling in PREP class; left voicemail

## 2022-08-07 ENCOUNTER — Telehealth (HOSPITAL_BASED_OUTPATIENT_CLINIC_OR_DEPARTMENT_OTHER): Payer: Self-pay

## 2022-08-07 DIAGNOSIS — I10 Essential (primary) hypertension: Secondary | ICD-10-CM

## 2022-08-07 DIAGNOSIS — E782 Mixed hyperlipidemia: Secondary | ICD-10-CM

## 2022-08-07 MED ORDER — ROSUVASTATIN CALCIUM 20 MG PO TABS: 20.0000 mg | ORAL_TABLET | Freq: Every day | ORAL | 3 refills | Status: DC

## 2022-08-07 MED ORDER — EZETIMIBE 10 MG PO TABS: 10.0000 mg | ORAL_TABLET | Freq: Every day | ORAL | 3 refills | Status: DC

## 2022-08-07 NOTE — Telephone Encounter (Addendum)
Results called to patient who verbalizes understanding! Rx to pharm and labs mailed to patient.    ----- Message from Loel Dubonnet, NP sent at 08/07/2022  3:56 PM EST ----- Normal kidney function. Cholesterol panel much improved from previous with LDL (bad cholesterol) previously 114 now 77. However, liver enzymes increased from previous which may be related to Rosuvastatin. Recommend reduce to '20mg'$  daily and instead start Zetia '10mg'$  daily. This will help to lower cholesterol but not affect the liver.   Update LFT/FLP in 3 months.

## 2022-08-09 ENCOUNTER — Telehealth: Payer: Self-pay

## 2022-08-09 NOTE — Telephone Encounter (Signed)
She returned my call; wants to take PREP again, had to withdraw in 2023; S/P THR on 1/12; will contact me when she is cleared to participate in class, possibly the March class at [facility].

## 2022-08-10 ENCOUNTER — Other Ambulatory Visit (HOSPITAL_BASED_OUTPATIENT_CLINIC_OR_DEPARTMENT_OTHER): Payer: Self-pay | Admitting: Cardiovascular Disease

## 2022-08-10 DIAGNOSIS — I10 Essential (primary) hypertension: Secondary | ICD-10-CM

## 2022-08-11 NOTE — Telephone Encounter (Signed)
Rx(s) sent to pharmacy electronically.  

## 2022-08-24 NOTE — Addendum Note (Signed)
Addended by: Gerald Stabs on: 08/24/2022 08:04 AM   Modules accepted: Orders

## 2022-08-28 ENCOUNTER — Telehealth: Payer: Self-pay | Admitting: Cardiovascular Disease

## 2022-08-28 MED ORDER — FUROSEMIDE 20 MG PO TABS: 20.0000 mg | ORAL_TABLET | Freq: Every day | ORAL | 0 refills | Status: DC

## 2022-08-28 NOTE — Telephone Encounter (Signed)
Changes in urination need to be addressed by primary care. Lump near her incision needs to be addressed by orthopedics.  She may take Furosemide (Lasix) '20mg'$  for 3 days (3 day supply only) for lower extremity edema. Continue compression stockings and sit with legs elevated as much as able given her surgery. If persistent despite Lasix needs office visit.   Loel Dubonnet, NP

## 2022-08-28 NOTE — Telephone Encounter (Signed)
Returned call to patient,   Patient states she went into surgery on 1/12 and thought her weight was up a little but cannot remember the number. It was an outpatient surgery, she notes that she has a hard time using the restroom while she was there. She states the nurse told her the MD's were concerned that she only put out about 30cc's during surgery. She states she continued to have small trickles when she got home. Her stomach was very distended, she states that she took something OTC for UTI and something OTC to help with urination. She is urinating more now, however she states that it comes and goes. She believes that she is up about 7-8 pounds since surgery. Starting last Wednesday she started hurting really badly and a baseball sized lump came  up under her incison. She states when this happened both her ankles were very swollen, she took a diuretic that she was previously prescribed. She has been wearing her compression socks daily.   She does have a call into the orthopedic office as well.   She has been keeping a log at Caitlin's request and she states that her blood pressures have been normal, she does not have the log with her at this time.   Off diuretic on 1/25

## 2022-08-28 NOTE — Telephone Encounter (Signed)
*  STAT* If patient is at the pharmacy, call can be transferred to refill team.   1. Which medications need to be refilled? (please list name of each medication and dose if known)  furosemide (LASIX) 20 MG tablet  2. Which pharmacy/location (including street and city if local pharmacy) is medication to be sent to? [place], [place], [place] [address]  3. Do they need a 30 day or 90 day supply?   Patient is requesting to have current Lasix prescription transferred to the pharmacy listed above.

## 2022-08-28 NOTE — Telephone Encounter (Signed)
Left message for patient to call back    "Changes in urination need to be addressed by primary care. Lump near her incision needs to be addressed by orthopedics.  She may take Furosemide (Lasix) '20mg'$  for 3 days (3 day supply only) for lower extremity edema. Continue compression stockings and sit with legs elevated as much as able given her surgery. If persistent despite Lasix needs office visit.    Loel Dubonnet, NP"

## 2022-08-28 NOTE — Telephone Encounter (Signed)
Patient returned call, transferred from call center. Provided the following recommendations to the patient and Rx to pharmacy. Patient states her HCTZ was stopped on 1/25.           "Changes in urination need to be addressed by primary care. Lump near her incision needs to be addressed by orthopedics.  She may take Furosemide (Lasix) '20mg'$  for 3 days (3 day supply only) for lower extremity edema. Continue compression stockings and sit with legs elevated as much as able given her surgery. If persistent despite Lasix needs office visit.    Loel Dubonnet, NP"

## 2022-08-28 NOTE — Addendum Note (Signed)
Addended by: Gerald Stabs on: 08/28/2022 03:50 PM   Modules accepted: Orders

## 2022-08-28 NOTE — Telephone Encounter (Signed)
Pt c/o swelling: STAT is pt has developed SOB within 24 hours  If swelling, where is the swelling located? Ankles, bilateral  How much weight have you gained and in what time span?  7-8 pounds since her hip surgery 1/12  Have you gained 3 pounds in a day or 5 pounds in a week? no  Do you have a log of your daily weights (if so, list)? no  Are you currently taking a fluid pill? no  Are you currently SOB? no  Have you traveled recently? no  Patient states she has swelling in her ankles. She says she was taken off her dieretic  1/25. She states she recently had surgery on her right hip and has what feels like a baseball under the incision. She says it is very painful and radiating down. She says she thought it was this, but both ankles were swollen and not just the right. She says she has also had a hard time urinating when she was in the hospital for her procedure. She says she will also be calling her surgeons office due to the baseball in her incision and difficulty walking. She states she is not sure if she can drive due to the leg pain.

## 2022-08-28 NOTE — Telephone Encounter (Signed)
Script transferred to desired pharmacy

## 2022-08-29 ENCOUNTER — Ambulatory Visit: Payer: BC Managed Care – PPO | Admitting: Gastroenterology

## 2022-09-01 ENCOUNTER — Telehealth (INDEPENDENT_AMBULATORY_CARE_PROVIDER_SITE_OTHER): Payer: BC Managed Care – PPO | Admitting: Family

## 2022-09-01 ENCOUNTER — Encounter (HOSPITAL_BASED_OUTPATIENT_CLINIC_OR_DEPARTMENT_OTHER): Payer: Self-pay | Admitting: Family

## 2022-09-01 ENCOUNTER — Ambulatory Visit: Payer: BC Managed Care – PPO

## 2022-09-01 VITALS — BP 103/65 | HR 86 | Ht 63.0 in | Wt 173.6 lb

## 2022-09-01 DIAGNOSIS — R600 Localized edema: Secondary | ICD-10-CM

## 2022-09-01 DIAGNOSIS — E785 Hyperlipidemia, unspecified: Secondary | ICD-10-CM

## 2022-09-01 DIAGNOSIS — I10 Essential (primary) hypertension: Secondary | ICD-10-CM | POA: Diagnosis not present

## 2022-09-01 DIAGNOSIS — I700 Atherosclerosis of aorta: Secondary | ICD-10-CM

## 2022-09-01 DIAGNOSIS — I25118 Atherosclerotic heart disease of native coronary artery with other forms of angina pectoris: Secondary | ICD-10-CM

## 2022-09-01 DIAGNOSIS — I2510 Atherosclerotic heart disease of native coronary artery without angina pectoris: Secondary | ICD-10-CM | POA: Diagnosis not present

## 2022-09-01 DIAGNOSIS — R7989 Other specified abnormal findings of blood chemistry: Secondary | ICD-10-CM

## 2022-09-01 MED ORDER — HYDROCHLOROTHIAZIDE 12.5 MG PO CAPS: 12.5000 mg | ORAL_CAPSULE | Freq: Every day | ORAL | 3 refills | Status: DC

## 2022-09-01 NOTE — Progress Notes (Signed)
Virtual Visit via Telephone Note   Because of Caitlin Rogers's co-morbid illnesses, she is at least at moderate risk for complications without adequate follow up.  This format is felt to be most appropriate for this patient at this time.  The patient did not have access to video technology/had technical difficulties with video requiring transitioning to audio format only (telephone).  All issues noted in this document were discussed and addressed.  No physical exam could be performed with this format.  Please refer to the patient's chart for her consent to telehealth for [facility].   Date:  09/01/2022   ID:  Caitlin Rogers, DOB 1955/10/11, MRN [id number] The patient was identified using 2 identifiers.  Patient Location: Home Provider Location: Office/Clinic  PCP:  Early, Coralee Pesa, NP   Ages Providers Cardiologist:  Skeet Latch, MD     Evaluation Performed:  Follow-Up Visit  Chief Complaint:  hypotension, edema  History of Present Illness:    Caitlin Rogers is a 67 y.o. female with CAD, HTN, HLD, prior tobacco use, GERD last seen 05/02/22.  Initial seen 11/10/16 for palpitations at request of her primary care. Treated with metoprolol after episode of palpitations while being treated for H pylori. Prior ETT with no ischemia. Nuclear stress test 2021 with LVEF >65%, no ischemia. Echo with mild LVH. Monitor 01/2020 with predominantly NSR, rare PVC/PAC. CT cardiac score 09/29/20 of 225 placing her in the 92nd percentile for age/sex matched control. Due to elevated blood pressure Olmesartan HCTZ previously increased to 40-12.5 mg.  At visit 01/30/22 she was hypotensive and Olmesartan-HCTZ reduced and later transitioned to Olmesartan alone. At visit 05/02/22 her Rosuvastatin was increased as LDL not <70. 08/07/22 her Rosuvastatin was decreased and Zetia added due to elevated liver enzymes.   She presents today for follow up. She sent MyChart message noting bilateral LE  edema, low urine output, and swelling at surgical site after 08/04/22 hip surgery. She was provided 3 days of Lasix and did note improvement in her LE edema. Notes surgical team has evaluated her "knot" at her surgical site and she was told it is excess fluid but no evidence of infection. She is doing physical therapy at home. Notes her ankles are still swelling but not as bad. When swollen they were swollen all day long. She has been wearing compression stockings. Felt with furosemide she could more fully empty her bladder. She notes feeling tired. Feels she is often having to take a nap in the afternoon. Discussed may be continued recovery from surgery.   Past Medical History:  Diagnosis Date   Allergy 2015   Itchy   Anxiety    Aortic atherosclerosis ([place]) 09/29/2020   On coronary CT    Arthritis    Atypical chest pain 11/11/2016   Cancer ([place]) 03/2022   Basal carcinoma mohs 04/18/22   Cardiac murmur 01/19/2020   Cataract 2023   Removed both eyes   Cervical radiculopathy 02/13/2020   Chest discomfort 01/19/2020   Coronary artery calcification 10/07/2020   COVID-19 06/2019   Current moderate episode of major depressive disorder without prior episode ([place]) 07/06/2020   Dermatitis 11/01/2020   Encounter for pre-operative cardiovascular clearance 07/18/2022   Essential hypertension 11/11/2016   GERD (gastroesophageal reflux disease) 11/11/2016   Hyperlipemia    Hyperlipidemia 11/11/2016   Hypertension    Numbness 01/02/2020   Other spondylosis with myelopathy, cervical region 01/19/2020   Palpitations 11/11/2016   Ringing in the ears  post covid symptom   Past Surgical History:  Procedure Laterality Date   APPENDECTOMY     BICEPT TENODESIS Left 10/28/2021   Procedure: BICEPS TENODESIS;  Surgeon: Leim Fabry, MD;  Location: [facility] ORS;  Service: Orthopedics;  Laterality: Left;   BUNIONECTOMY     bilat   CERVICAL SPINE SURGERY     CESAREAN SECTION     in toe   COLONOSCOPY   03/10/2016   Dr.Stark   JOINT REPLACEMENT  2023   Left shoulder   MOUTH SURGERY  2019   implant with crown   POLYPECTOMY     REVERSE SHOULDER ARTHROPLASTY Left 10/28/2021   Procedure: Left reverse shoulder arthroplasty, biceps tenodesis;  Surgeon: Leim Fabry, MD;  Location: [facility] ORS;  Service: Orthopedics;  Laterality: Left;   SHOULDER ARTHROSCOPY WITH ROTATOR CUFF REPAIR AND SUBACROMIAL DECOMPRESSION Left 07/01/2021   Procedure: Left shoulder arthroscopic subscapularis repair, mini-open supraspinatus repair, subacromial decompression, and bicepst tenodesis;  Surgeon: Leim Fabry, MD;  Location: [place];  Service: Orthopedics;  Laterality: Left;   SPINE SURGERY  2015   TONSILLECTOMY     TUBAL LIGATION     UPPER GASTROINTESTINAL ENDOSCOPY     WISDOM TOOTH EXTRACTION       Current Meds  Medication Sig   ALPRAZolam (XANAX) 0.5 MG tablet TAKE ONE TABLET BY MOUTH NO MORE THAN TWICE A DAY FOR SEVERE ANXIETY   aspirin EC 81 MG tablet Take 81 mg by mouth daily. Swallow whole.   diclofenac Sodium (VOLTAREN) 1 % GEL Apply 1 application  topically 4 (four) times daily as needed (pain).   Docusate Sodium (DSS) 100 MG CAPS Take 1 capsule by mouth daily.   ezetimibe (ZETIA) 10 MG tablet Take 1 tablet (10 mg total) by mouth daily.   hydrochlorothiazide (MICROZIDE) 12.5 MG capsule Take 1 capsule (12.5 mg total) by mouth daily.   ibuprofen (ADVIL) 600 MG tablet Take 600 mg by mouth every 6 (six) hours as needed.   methocarbamol (ROBAXIN) 500 MG tablet Take 500 mg by mouth every 8 (eight) hours as needed.   metoprolol succinate (TOPROL-XL) 25 MG 24 hr tablet TAKE ONE TABLET BY MOUTH ONE TIME DAILY   omeprazole (PRILOSEC) 40 MG capsule Take 1 capsule (40 mg total) by mouth daily.   rosuvastatin (CRESTOR) 20 MG tablet Take 1 tablet (20 mg total) by mouth daily.   [DISCONTINUED] olmesartan (BENICAR) 5 MG tablet TAKE ONE TABLET BY MOUTH ONE TIME DAILY     Allergies:   Codeine and Tramadol    Social History   Tobacco Use   Smoking status: Former    Packs/day: 0.50    Years: 15.00    Total pack years: 7.50    Types: Cigarettes    Quit date: 09/30/2012    Years since quitting: 9.9   Smokeless tobacco: Never  Vaping Use   Vaping Use: Never used  Substance Use Topics   Alcohol use: Yes    Alcohol/week: 6.0 standard drinks of alcohol    Types: 3 Glasses of wine, 3 Standard drinks or equivalent per week   Drug use: Yes    Types: Marijuana     Family Hx: The patient's family history includes Alcohol abuse in her brother, father, and mother; Anxiety disorder in her mother; Arthritis in her mother; CAD in her brother; Colon cancer in her paternal aunt; Diabetes in her father and mother; Diabetes Mellitus II in her mother; Early death in her mother; Heart attack in her paternal grandfather;  Heart disease in her brother, brother, father, and mother; Hyperlipidemia in her brother, brother, father, and mother; Hypertension in her brother, father, and mother; Obesity in her mother; Stroke in her brother and father. There is no history of Colon polyps, Esophageal cancer, Rectal cancer, or Stomach cancer.  ROS:   Please see the history of present illness.     All other systems reviewed and are negative.   Prior CV studies:   The following studies were reviewed today:  Coronary calcium score 09/29/20 IMPRESSION: Coronary calcium score of 225. This was 92nd percentile for age and sex matched control.   Recommend aggressive risk factor modification including LDL goal <70.    Labs/Other Tests and Data Reviewed:    EKG:  No ECG reviewed.  Recent Labs: 04/14/2022: Hemoglobin 11.3; Platelets 218; TSH 1.620 07/28/2022: ALT 34; BUN 16; Creatinine, Ser 0.79; Potassium 3.6; Sodium 142   Recent Lipid Panel Lab Results  Component Value Date/Time   CHOL 190 07/28/2022 09:11 AM   TRIG 98 07/28/2022 09:11 AM   HDL 96 07/28/2022 09:11 AM   CHOLHDL 2.0 07/28/2022 09:11 AM   CHOLHDL 2  06/14/2021 09:03 AM   LDLCALC 77 07/28/2022 09:11 AM    Wt Readings from Last 3 Encounters:  09/01/22 173 lb 9.6 oz (78.7 kg)  06/26/22 172 lb 6.4 oz (78.2 kg)  05/02/22 174 lb 3.2 oz (79 kg)        Objective:    Vital Signs:  BP 103/65   Pulse 86   Ht 5' 3"$  (1.6 m)   Wt 173 lb 9.6 oz (78.7 kg)   BMI 30.75 kg/m    VITAL SIGNS:  reviewed  ASSESSMENT & PLAN:    Coronary calcification / Aortic atherosclerosis / HLD, LDL goal < 70 / Transaminitis - Stable with no anginal symptoms. No indication for ischemic evaluation.  Rosuvastatin previously reduced and Zetia added as LDL not at goal <70 but elevated liver enzymes. Repeat FLP/LFT mid April already ordered for monitoring. Heart healthy diet and regular cardiovascular exercise encouraged.    LE edema - Bilateral ankle edema post surgery. Likely venous insufficiency related as more sedentary recently. Saw improvement with Lasix 17m QD x 3 days. Will resume HCTZ 12.548mQD. Stop Olmesartan 64m68mD to  prevent hypotension. She does note some urinary hesitancy or sensation of not fully emptying her bladder. She was encouraged to follow up with primary care regarding this.   HTN - Home BP routinely <130/80. To prevention, stop Olmesartan as we are resuming HCTZ 12.64mg60m as above.       Time:   Today, I have spent 15 minutes with the patient with telehealth technology discussing the above problems.     Medication Adjustments/Labs and Tests Ordered: Current medicines are reviewed at length with the patient today.  Concerns regarding medicines are outlined above.   Tests Ordered: No orders of the defined types were placed in this encounter.   Medication Changes: Meds ordered this encounter  Medications   hydrochlorothiazide (MICROZIDE) 12.5 MG capsule    Sig: Take 1 capsule (12.5 mg total) by mouth daily.    Dispense:  90 capsule    Refill:  3    Order Specific Question:   Supervising Provider    Answer:   CHRIBuford [id number] Up:  In Person  in July with Dr. RandOval Linseygned, CaitLoel Dubonnet  09/01/2022 7:27 PM    [place]

## 2022-09-01 NOTE — Patient Instructions (Signed)
Medication Instructions:  Your physician has recommended you make the following change in your medication:   Stop Olmesartan  Start hydrochlorothiazide 12.6m daily  *If you need a refill on your cardiac medications before your next appointment, please call your pharmacy*   Lab Work: Your physician recommends that you return for lab work the second week of April for fasting lipid panel and liver check.  You already have these lab slips at home.  If you have labs (blood work) drawn today and your tests are completely normal, you will receive your results only by: [place](if you have MyChart) OR A paper copy in the mail If you have any lab test that is abnormal or we need to change your treatment, we will call you to review the results.   Testing/Procedures: None ordered today. If you continue having lots of difficulty with swelling we may consider an echocardiogram (ultrasound of your heart) to make sure it is pumping well   Follow-Up: At [facility] you and your health needs are our priority.  As part of our continuing mission to provide you with exceptional heart care, we have created designated Provider Care Teams.  These Care Teams include your primary Cardiologist (physician) and Advanced Practice Providers (APPs -  Physician Assistants and Nurse Practitioners) who all work together to provide you with the care you need, when you need it.  We recommend signing up for the patient portal called "MyChart".  Sign up information is provided on this After Visit Summary.  MyChart is used to connect with patients for Virtual Visits (Telemedicine).  Patients are able to view lab/test results, encounter notes, upcoming appointments, etc.  Non-urgent messages can be sent to your provider as well.   To learn more about what you can do with MyChart, go to [email or web address]    Your next appointment:   July 22nd at 9:20 AM with Dr. ROval Linseyin person  Other  Instructions  Keep checking blood pressure once per day and keeping a log We will send a MyChart in one week to check in   To prevent or reduce lower extremity swelling: Eat a low salt diet. Salt makes the body hold onto extra fluid which causes swelling. Sit with legs elevated. For example, in the recliner or on an [place]  Wear knee-high compression stockings during the daytime. Ones labeled 15-20 mmHg provide good compression.

## 2022-09-07 ENCOUNTER — Telehealth (HOSPITAL_BASED_OUTPATIENT_CLINIC_OR_DEPARTMENT_OTHER): Payer: Self-pay | Admitting: Family

## 2022-09-07 ENCOUNTER — Encounter (HOSPITAL_BASED_OUTPATIENT_CLINIC_OR_DEPARTMENT_OTHER): Payer: Self-pay

## 2022-09-07 DIAGNOSIS — E785 Hyperlipidemia, unspecified: Secondary | ICD-10-CM

## 2022-09-07 NOTE — Telephone Encounter (Signed)
Labs reordered for [facility] and mailed to patient

## 2022-09-07 NOTE — Telephone Encounter (Signed)
New Message:    Patient says her insurance have changed this [id number]). She says she must have all her lab work did at [facility] please. She will need this order mailed to her please.

## 2022-09-13 ENCOUNTER — Encounter: Payer: Self-pay | Admitting: Gastroenterology

## 2022-09-13 ENCOUNTER — Ambulatory Visit (INDEPENDENT_AMBULATORY_CARE_PROVIDER_SITE_OTHER): Payer: BC Managed Care – PPO | Admitting: Gastroenterology

## 2022-09-13 VITALS — BP 136/76 | HR 84 | Ht 63.0 in | Wt 172.0 lb

## 2022-09-13 DIAGNOSIS — R7989 Other specified abnormal findings of blood chemistry: Secondary | ICD-10-CM

## 2022-09-13 DIAGNOSIS — K219 Gastro-esophageal reflux disease without esophagitis: Secondary | ICD-10-CM

## 2022-09-13 MED ORDER — OMEPRAZOLE 40 MG PO CPDR: 40.0000 mg | DELAYED_RELEASE_CAPSULE | Freq: Every day | ORAL | 3 refills | Status: DC

## 2022-09-13 NOTE — Patient Instructions (Signed)
You have been scheduled for an abdominal ultrasound at [facility] (1st floor of hospital) on 09/20/2022 at 9:30am. Please arrive 30 minutes prior to your appointment for registration. Make certain not to have anything to eat or drink 6 hours prior to your appointment. Should you need to reschedule your appointment, please contact radiology at [phone]. This test typically takes about 30 minutes to perform.  We have sent the following medications to your pharmacy for you to pick up at your convenience: Omeprazole    _______________________________________________________  If your blood pressure at your visit was 140/90 or greater, please contact your primary care physician to follow up on this.  _______________________________________________________  If you are age 50 or older, your body mass index should be between 23-30. Your Body mass index is 30.47 kg/m. If this is out of the aforementioned range listed, please consider follow up with your Primary Care Provider.  If you are age 70 or younger, your body mass index should be between 19-25. Your Body mass index is 30.47 kg/m. If this is out of the aformentioned range listed, please consider follow up with your Primary Care Provider.   ________________________________________________________  The [place] [facility] providers would like to encourage you to use [facility] to communicate with providers for non-urgent requests or questions.  Due to long hold times on the telephone, sending your provider a message by [facility] may be a faster and more efficient way to get a response.  Please allow 48 business hours for a response.  Please remember that this is for non-urgent requests.   Thank you for choosing me and [facility].  Pricilla Riffle. Dagoberto Ligas., MD., Marval Regal

## 2022-09-13 NOTE — Progress Notes (Signed)
    Assessment     Mildly elevated transaminases, possible statin side effect. R/O other causes GERD, well-controlled Personal history of adenomatous colon polyps   Recommendations    Continue omeprazole 40 mg qd and follow antireflux measures Schedule RUQ [place].  If transaminases remain elevated standard hepatic serologic evaluation Repeat LFTs planned by her cardiologist next month.  Patient states she will forward the results to me.  If LFTs remain elevated proceed as in #2. Surveillance colonoscopy recommended November 2029 REV in 1 year   HPI    This is a 67 year old female returning for follow-up of GERD and for elevated LFTs.  She relates her reflux symptoms are under very good control on her current regimen.  She has had mildly elevated transaminases on several occasions since 2021. Her cardiologist has attributed this to statins.  Recently her Crestor dose was reduced from 40 mg to 20 mg daily.  Colonoscopy Nov 2022    Labs / Imaging       Latest Ref Rng & Units 07/28/2022    9:11 AM 04/14/2022   10:57 AM 10/14/2021    1:09 PM  Hepatic Function  Total Protein 6.0 - 8.5 g/dL 6.5  6.9  8.3   Albumin 3.9 - 4.9 g/dL 4.2  4.5  4.2   AST 0 - 40 IU/L 52  29  44   ALT 0 - 32 IU/L 34  21  23   Alk Phosphatase 44 - 121 IU/L 70  77  81   Total Bilirubin 0.0 - 1.2 mg/dL 0.2  0.4  0.8        Latest Ref Rng & Units 04/14/2022   10:57 AM 10/29/2021    5:28 AM 10/14/2021    1:09 PM  CBC  WBC 3.4 - 10.8 x10E3/uL 4.0  8.8  6.0   Hemoglobin 11.1 - 15.9 g/dL 11.3  10.3  11.9   Hematocrit 34.0 - 46.6 % 33.4  31.7  35.6   Platelets 150 - 450 x10E3/uL 218  211  194    Current Medications, Allergies, Past Medical History, Past Surgical History, Family History and Social History were reviewed in [employer] record.   Physical Exam: General: Well developed, well nourished, no acute distress Head: Normocephalic and atraumatic Eyes: Sclerae anicteric, EOMI Ears:  Normal auditory acuity Mouth: No deformities or lesions noted Lungs: Clear throughout to auscultation Heart: Regular rate and rhythm; No murmurs, rubs or bruits Abdomen: Soft, non tender and non distended. No masses, hepatosplenomegaly or hernias noted. Normal Bowel sounds Rectal: Not done Musculoskeletal: Symmetrical with no gross deformities  Pulses:  Normal pulses noted Extremities: No edema or deformities noted Neurological: Alert oriented x 4, grossly nonfocal Psychological:  Alert and cooperative. Normal mood and affect   Ciena Sampley T. Fuller Plan, MD 09/13/2022, 2:50 PM

## 2022-09-14 ENCOUNTER — Telehealth: Payer: Self-pay | Admitting: Gastroenterology

## 2022-09-14 NOTE — Telephone Encounter (Signed)
Patient calling to provide in regards to referral.  Would like referral sent to [facility]  Provided a fax number [phone] Please advise. Thank you

## 2022-09-14 NOTE — Telephone Encounter (Signed)
Estill Bamberg are you waiting for this information?

## 2022-09-14 NOTE — Telephone Encounter (Signed)
Patient states she wants to have the ultrasound scheduled at [place] for insurance purposes. Per patient if we fax the order to the fax number provided then they will set up her ultrasound through a third party company called [facility] [employer]. Informed patient I will cancel her ultrasound at [place] and fax the order. Patient would like a confirmation when we have faxed the order. Informed patient I will send her a MyChart message when it is faxed and also if she can reply to [place] and let us know when it is scheduled and how they will be sending the results to our office. Provided fax number to our office in the [place] message.

## 2022-09-20 ENCOUNTER — Ambulatory Visit (HOSPITAL_COMMUNITY): Payer: BC Managed Care – PPO

## 2022-09-20 ENCOUNTER — Telehealth (HOSPITAL_BASED_OUTPATIENT_CLINIC_OR_DEPARTMENT_OTHER): Payer: Self-pay | Admitting: *Deleted

## 2022-09-20 DIAGNOSIS — Z96612 Presence of left artificial shoulder joint: Secondary | ICD-10-CM | POA: Diagnosis not present

## 2022-09-20 NOTE — Telephone Encounter (Signed)
Below message received via mychart under scheduling  Caitlin Rogers  P Cv Div Dwb Scheduling33 minutes ago (11:31 AM)    Can you share with Dr Oval Linsey and/or Urban Gibson   From [place] ordered by Lucio Edward  Gastroenterologist because of liver levels   Impression IMPRESSION: 1. Atherosclerotic changes of the aorta.. No etiology for elevated liver function tests.  Electronically Signed by: Maurice Small, MD on 09/19/2022 4:50 PM Narrative TECHNIQUE: Realtime multiplanar grayscale and limited color Doppler ultrasound of the right upper quadrant was performed. COMPARISON: None.  INDICATION: Elevated liver function tests  FINDINGS:  PANCREAS:  No focal abnormalities are identified. Visualization is limited.  VASCULATURE: No abdominal aortic aneurysm. Atherosclerotic changes of the aorta. Visualized IVC is patent. Portal vein is patent with normal flow direction.  HEPATOBILIARY: Liver: Normal.  Gallbladder: Normal CBD: Normal. No intrahepatic biliary ductal dilatation.  RIGHT KIDNEY: Right kidney is of normal size. No hydronephrosis. No suspicious masses. Normal renal echotexture.  MISC: N./A.  Will forward to Dr Oval Linsey and Overton Mam NP for review

## 2022-09-25 ENCOUNTER — Other Ambulatory Visit (HOSPITAL_BASED_OUTPATIENT_CLINIC_OR_DEPARTMENT_OTHER): Payer: Self-pay | Admitting: Nurse Practitioner

## 2022-09-25 ENCOUNTER — Encounter (HOSPITAL_BASED_OUTPATIENT_CLINIC_OR_DEPARTMENT_OTHER): Payer: Self-pay

## 2022-09-25 ENCOUNTER — Telehealth: Payer: Self-pay

## 2022-09-25 DIAGNOSIS — F321 Major depressive disorder, single episode, moderate: Secondary | ICD-10-CM

## 2022-09-25 DIAGNOSIS — L905 Scar conditions and fibrosis of skin: Secondary | ICD-10-CM | POA: Diagnosis not present

## 2022-09-25 DIAGNOSIS — I10 Essential (primary) hypertension: Secondary | ICD-10-CM

## 2022-09-25 DIAGNOSIS — Z85828 Personal history of other malignant neoplasm of skin: Secondary | ICD-10-CM | POA: Diagnosis not present

## 2022-09-25 DIAGNOSIS — F419 Anxiety disorder, unspecified: Secondary | ICD-10-CM

## 2022-09-25 NOTE — Telephone Encounter (Signed)
Called to confirm participation in next PREP class at [place] on March 19; assessment visit scheduled for March 11 at 2pm

## 2022-09-25 NOTE — Telephone Encounter (Signed)
BP log as requested 

## 2022-09-26 MED ORDER — HYDROCHLOROTHIAZIDE 12.5 MG PO CAPS: 25.0000 mg | ORAL_CAPSULE | Freq: Every day | ORAL | 3 refills | Status: DC

## 2022-09-26 NOTE — Telephone Encounter (Signed)
Refill request last apt 06/26/22

## 2022-09-29 MED ORDER — ALPRAZOLAM 0.5 MG PO TABS: ORAL_TABLET | ORAL | 0 refills | Status: DC

## 2022-10-02 NOTE — Progress Notes (Signed)
[facility] PREP Evaluation  Patient Details  Name: Caitlin Rogers MRN: [id number] Date of Birth: [birth year] Age: 67 y.o. PCP: Orma Render, NP  Vitals:   10/02/22 1445  BP: (!) 152/62  Pulse: 95  SpO2: 98%  Weight: 172 lb (78 kg)     [facility] Eval - 10/02/22 1400       [facility] "PREP" Location   [facility] "PREP" Location [place]      Referral    Referring Provider Oval Linsey    Reason for referral Hypertension;Inactivity;High Cholesterol    Program Start Date 10/10/22      Measurement   Waist Circumference 43 inches    Hip Circumference 47 inches    Body fat 43.2 percent      Information for Trainer   Goals --   Establish exercise routine; lose 10-15 pounds by end of program   Current Exercise --   Hip exercises from PT; none   Orthopedic Concerns --   s/p L total shoulder, R THR   Pertinent Medical History --   HTN, OA   Medications that affect exercise Beta blocker      Timed Up and Go (TUGS)   Timed Up and Go Low risk <9 seconds      Mobility and Daily Activities   I find it easy to walk up or down two or more flights of stairs. 1    I have no trouble taking out the trash. 4    I do housework such as vacuuming and dusting on my own without difficulty. 4    I can easily lift a gallon of milk (8lbs). 4    I can easily walk a mile. 3    I have no trouble reaching into high cupboards or reaching down to pick up something from the floor. 2    I do not have trouble doing out-door work such as Armed forces logistics/support/administrative officer, raking leaves, or gardening. 1      Mobility and Daily Activities   I feel younger than my age. 3    I feel independent. 4    I feel energetic. 1    I live an active life.  2    I feel strong. 2    I feel healthy. 2    I feel active as other people my age. 2      How fit and strong are you.   Fit and Strong Total Score 35            Past Medical History:  Diagnosis Date   Allergy 2015   Itchy   Anxiety    Aortic atherosclerosis ([place]) 09/29/2020   On  coronary CT    Arthritis    Atypical chest pain 11/11/2016   Cancer ([place]) 03/2022   Basal carcinoma mohs 04/18/22   Cardiac murmur 01/19/2020   Cataract 2023   Removed both eyes   Cervical radiculopathy 02/13/2020   Chest discomfort 01/19/2020   Coronary artery calcification 10/07/2020   COVID-19 06/2019   Current moderate episode of major depressive disorder without prior episode ([place]) 07/06/2020   Dermatitis 11/01/2020   Encounter for pre-operative cardiovascular clearance 07/18/2022   Essential hypertension 11/11/2016   GERD (gastroesophageal reflux disease) 11/11/2016   Hyperlipemia    Hyperlipidemia 11/11/2016   Hypertension    Numbness 01/02/2020   Other spondylosis with myelopathy, cervical region 01/19/2020   Palpitations 11/11/2016   Ringing in the ears    post covid symptom   Past Surgical  History:  Procedure Laterality Date   APPENDECTOMY     BICEPT TENODESIS Left 10/28/2021   Procedure: BICEPS TENODESIS;  Surgeon: Leim Fabry, MD;  Location: [facility];  Service: Orthopedics;  Laterality: Left;   BUNIONECTOMY     bilat   CERVICAL SPINE SURGERY     CESAREAN SECTION     in toe   COLONOSCOPY  03/10/2016   Dr.Stark   JOINT REPLACEMENT  2023   Left shoulder   MOUTH SURGERY  2019   implant with crown   POLYPECTOMY     REVERSE SHOULDER ARTHROPLASTY Left 10/28/2021   Procedure: Left reverse shoulder arthroplasty, biceps tenodesis;  Surgeon: Leim Fabry, MD;  Location: [facility];  Service: Orthopedics;  Laterality: Left;   SHOULDER ARTHROSCOPY WITH ROTATOR CUFF REPAIR AND SUBACROMIAL DECOMPRESSION Left 07/01/2021   Procedure: Left shoulder arthroscopic subscapularis repair, mini-open supraspinatus repair, subacromial decompression, and bicepst tenodesis;  Surgeon: Leim Fabry, MD;  Location: [place];  Service: Orthopedics;  Laterality: Left;   SPINE SURGERY  2015   TONSILLECTOMY     TUBAL LIGATION     UPPER GASTROINTESTINAL ENDOSCOPY     WISDOM TOOTH  EXTRACTION     Social History   Tobacco Use  Smoking Status Former   Packs/day: 0.50   Years: 15.00   Total pack years: 7.50   Types: Cigarettes   Quit date: 09/30/2012   Years since quitting: 10.0  Smokeless Tobacco Never  To restart PREP class at [facility] on March 19, every T/Th 10-11:15  Deloria Brassfield B Legend Tumminello 10/02/2022, 2:50 PM

## 2022-10-06 ENCOUNTER — Ambulatory Visit: Payer: BC Managed Care – PPO

## 2022-10-06 MED ORDER — HYDROCHLOROTHIAZIDE 25 MG PO TABS: 25.0000 mg | ORAL_TABLET | Freq: Every day | ORAL | 3 refills | Status: DC

## 2022-10-06 NOTE — Addendum Note (Signed)
Addended by: Loel Dubonnet on: 10/06/2022 04:09 PM   Modules accepted: Orders

## 2022-10-06 NOTE — Telephone Encounter (Signed)
BP log as requested 

## 2022-10-09 NOTE — Addendum Note (Signed)
Addended by: Loel Dubonnet on: 10/09/2022 07:37 AM   Modules accepted: Orders

## 2022-10-10 NOTE — Progress Notes (Signed)
[facility] PREP Weekly Session  Patient Details  Name: Caitlin Rogers MRN: [id number] Date of Birth: 1955-08-10 Age: 67 y.o. PCP: Orma Render, NP  There were no vitals filed for this visit.   [facility] Weekly seesion - 10/10/22 1100       [facility] "PREP" Location   [facility] "PREP" Location [name]      Weekly Session   Topic Discussed Goal setting and welcome to the program   Introductions, review of notebook, tour of facilty, option to work out on cardio machine   Classes attended to date [place] 10/10/2022, 11:45 AM

## 2022-10-17 NOTE — Progress Notes (Signed)
[facility] PREP Weekly Session  Patient Details  Name: Caitlin Rogers MRN: [id number] Date of Birth: 01/16/1956 Age: 67 y.o. PCP: Orma Render, NP  Vitals:   10/17/22 1144  Weight: 172 lb (78 kg)     [facility] Weekly seesion - 10/17/22 1100       [facility] "PREP" Location   [facility] "PREP" Location [name]      Weekly Session   Topic Discussed Other ways to be active;Importance of resistance training   Sitting no more than 30 minutes: cardio work up to 150 min/wk; strength training 2-3 times a week for 20-40 minutes   Minutes exercised this week 180 minutes    Classes attended to date [place] 10/17/2022, 11:45 AM

## 2022-10-24 NOTE — Progress Notes (Signed)
[facility] PREP Weekly Session  Patient Details  Name: Caitlin Rogers MRN: [id number] Date of Birth: 18-Apr-1956 Age: 67 y.o. PCP: Orma Render, NP  Vitals:   10/24/22 1237  Weight: 174 lb (78.9 kg)     [facility] Weekly seesion - 10/24/22 1200       [facility] "PREP" Location   [facility] "PREP" Location [name]      Weekly Session   Topic Discussed Healthy eating tips   Foods to reduce, foods to increase; introduced YUKA app   Minutes exercised this week 150 minutes    Classes attended to date [place] 10/24/2022, 12:38 PM

## 2022-10-25 DIAGNOSIS — I10 Essential (primary) hypertension: Secondary | ICD-10-CM | POA: Diagnosis not present

## 2022-10-30 DIAGNOSIS — Z85828 Personal history of other malignant neoplasm of skin: Secondary | ICD-10-CM | POA: Diagnosis not present

## 2022-10-30 DIAGNOSIS — L905 Scar conditions and fibrosis of skin: Secondary | ICD-10-CM | POA: Diagnosis not present

## 2022-10-31 NOTE — Progress Notes (Signed)
[facility] PREP Weekly Session  Patient Details  Name: Caitlin Rogers MRN: [id number] Date of Birth: 07-06-1956 Age: 67 y.o. PCP: Tollie Eth, NP  Vitals:   10/31/22 1314  Weight: 172 lb (78 kg)     [facility] Weekly seesion - 10/31/22 1300       [facility] "PREP" Location   [facility] "PREP" Location [name]      Weekly Session   Topic Discussed Health habits   Sugar demo   Minutes exercised this week 600 minutes    Classes attended to date 6             Keino Placencia B Kamilo Och 10/31/2022, 1:14 PM

## 2022-11-01 ENCOUNTER — Telehealth: Payer: Self-pay | Admitting: Cardiovascular Disease

## 2022-11-01 NOTE — Telephone Encounter (Signed)
Pt called to let office know that she had her labs done at [facility] on 10/25/22 and they we reported on 10/26/22. She's now going to send them to office as an attachment through MyChart due to her saying its not showing in her charts as it did the last time. Please advise.

## 2022-11-01 NOTE — Telephone Encounter (Signed)
Hello again,  If you sent your labs we have not received them.  Thank you,  Barth Kirks

## 2022-11-02 MED ORDER — POTASSIUM CHLORIDE CRYS ER 10 MEQ PO TBCR: 10.0000 meq | EXTENDED_RELEASE_TABLET | Freq: Every day | ORAL | 3 refills | Status: DC

## 2022-11-02 NOTE — Telephone Encounter (Signed)
What dose would you like her on and repeat BMP?

## 2022-11-02 NOTE — Telephone Encounter (Signed)
K daily.  BMP in 2-3 weeks   TY! Alver Sorrow, NP

## 2022-11-02 NOTE — Addendum Note (Signed)
Addended by: Marlene Lard on: 11/02/2022 01:12 PM   Modules accepted: Orders

## 2022-11-03 DIAGNOSIS — M25552 Pain in left hip: Secondary | ICD-10-CM | POA: Diagnosis not present

## 2022-11-07 NOTE — Progress Notes (Signed)
[facility] PREP Weekly Session  Patient Details  Name: Caitlin Rogers MRN: [id number] Date of Birth: Jun 29, 1956 Age: 67 y.o. PCP: Tollie Eth, NP  Vitals:   11/07/22 1135  Weight: 172 lb (78 kg)     [facility] Weekly seesion - 11/07/22 1100       [facility] "PREP" Location   [facility] "PREP" Location [name]      Weekly Session   Topic Discussed Restaurant Eating   salt demo; limit salt intake to 1550-2300 mg/day   Minutes exercised this week 130 minutes    Classes attended to date 7             Damari Hiltz B Markis Langland 11/07/2022, 11:36 AM

## 2022-11-17 ENCOUNTER — Encounter (HOSPITAL_BASED_OUTPATIENT_CLINIC_OR_DEPARTMENT_OTHER): Payer: Self-pay

## 2022-11-20 DIAGNOSIS — M7062 Trochanteric bursitis, left hip: Secondary | ICD-10-CM | POA: Diagnosis not present

## 2022-11-20 DIAGNOSIS — M1612 Unilateral primary osteoarthritis, left hip: Secondary | ICD-10-CM | POA: Diagnosis not present

## 2022-11-21 DIAGNOSIS — I10 Essential (primary) hypertension: Secondary | ICD-10-CM | POA: Diagnosis not present

## 2022-11-21 LAB — BASIC METABOLIC PANEL
BUN/Creatinine Ratio: 25 (ref 12–28)
BUN: 19 mg/dL (ref 8–27)
CO2: 25 mmol/L (ref 20–29)
Calcium: 9.8 mg/dL (ref 8.7–10.3)
Chloride: 93 mmol/L — ABNORMAL LOW (ref 96–106)
Creatinine, Ser: 0.75 mg/dL (ref 0.57–1.00)
Glucose: 80 mg/dL (ref 70–99)
Potassium: 3.5 mmol/L (ref 3.5–5.2)
Sodium: 138 mmol/L (ref 134–144)
eGFR: 88 mL/min/{1.73_m2} (ref >59)

## 2022-11-21 NOTE — Progress Notes (Signed)
[facility] PREP Weekly Session  Patient Details  Name: Caitlin Rogers MRN: [id number] Date of Birth: [birth year] Age: 67 y.o. PCP: Tollie Eth, NP  Vitals:   11/21/22 1115  Weight: 171 lb (77.6 kg)     [facility] Weekly seesion - 11/21/22 1100       [facility] "PREP" Location   [facility] "PREP" Location [name]      Weekly Session   Topic Discussed Expectations and non-scale victories   Halfway through program, revisit goals; staying positive   Minutes exercised this week 210 minutes    Classes attended to date 10             Nehal Shives B Jacquan Savas 11/21/2022, 11:16 AM

## 2022-12-05 NOTE — Progress Notes (Signed)
[facility] PREP Weekly Session  Patient Details  Name: Caitlin Rogers MRN: [id number] Date of Birth: February 07, 1956 Age: 67 y.o. PCP: Tollie Eth, NP  Vitals:   12/05/22 1112  Weight: 172 lb 4.8 oz (78.2 kg)     [facility] Weekly seesion - 12/05/22 1100       [facility] "PREP" Location   [facility] "PREP" Location [name]      Weekly Session   Topic Discussed Finding support   Review of multiple food labels brought from home   Minutes exercised this week 270 minutes    Classes attended to date 47             Ran Tullis B Jathniel Smeltzer 12/05/2022, 11:13 AM

## 2022-12-12 NOTE — Progress Notes (Signed)
[facility] PREP Weekly Session  Patient Details  Name: Caitlin Rogers MRN: [id number] Date of Birth: [birth year] Age: 67 y.o. PCP: Tollie Eth, NP  Vitals:   12/12/22 1131  Weight: 172 lb (78 kg)     [facility] Weekly seesion - 12/12/22 1100       [facility] "PREP" Location   [facility] "PREP" Location [name]      Weekly Session   Topic Discussed Calorie breakdown   Carbs, fats and protens; membership talk w/Nick   Minutes exercised this week 230 minutes    Classes attended to date 18             Saharah Sherrow B [place] Thielman 12/12/2022, 11:32 AM

## 2022-12-19 NOTE — Progress Notes (Signed)
[facility] PREP Weekly Session  Patient Details  Name: Caitlin Rogers MRN: [id number] Date of Birth: October 14, 1955 Age: 67 y.o. PCP: Tollie Eth, NP  Vitals:   12/19/22 1136  Weight: 173 lb (78.5 kg)     [facility] Weekly seesion - 12/19/22 1100       [facility] "PREP" Location   [facility] "PREP" Location [name]      Weekly Session   Topic Discussed Hitting roadblocks   Review of goals and activity plan for next 3 months and PREP survey to bring to final assessment visit next week.   Minutes exercised this week 240 minutes    Classes attended to date 7             Sonia Baller 12/19/2022, 11:36 AM

## 2022-12-25 ENCOUNTER — Other Ambulatory Visit (HOSPITAL_BASED_OUTPATIENT_CLINIC_OR_DEPARTMENT_OTHER): Payer: Self-pay | Admitting: Nurse Practitioner

## 2022-12-25 DIAGNOSIS — F321 Major depressive disorder, single episode, moderate: Secondary | ICD-10-CM

## 2022-12-25 DIAGNOSIS — F419 Anxiety disorder, unspecified: Secondary | ICD-10-CM

## 2022-12-25 MED ORDER — ALPRAZOLAM 0.5 MG PO TABS
ORAL_TABLET | ORAL | 0 refills | Status: DC
Start: 2022-12-25 — End: 2023-03-27

## 2022-12-25 NOTE — Telephone Encounter (Signed)
Refill request last apt 06/26/22 

## 2022-12-26 NOTE — Progress Notes (Signed)
How fit and strong survey: 10/02/22: 35 12/26/22: 46  Inches lost: 1 Education sessions completed: 10 Workout sessions completed: 8

## 2022-12-26 NOTE — Progress Notes (Signed)
[facility] PREP Evaluation  Patient Details  Name: Caitlin Rogers MRN: [id number] Date of Birth: [birth year] Age: 67 y.o. PCP: Tollie Eth, NP  Vitals:   12/26/22 1110  BP: (!) 140/66  Pulse: 89  SpO2: 98%  Weight: 176 lb (79.8 kg)     Past Medical History:  Diagnosis Date   Allergy 2015   Itchy   Anxiety    Aortic atherosclerosis ([facility]) 09/29/2020   On coronary CT    Arthritis    Atypical chest pain 11/11/2016   Cancer ([facility]) 03/2022   Basal carcinoma mohs 04/18/22   Cardiac murmur 01/19/2020   Cataract 2023   Removed both eyes   Cervical radiculopathy 02/13/2020   Chest discomfort 01/19/2020   Coronary artery calcification 10/07/2020   COVID-19 06/2019   Current moderate episode of major depressive disorder without prior episode ([facility]) 07/06/2020   Dermatitis 11/01/2020   Encounter for pre-operative cardiovascular clearance 07/18/2022   Essential hypertension 11/11/2016   GERD (gastroesophageal reflux disease) 11/11/2016   Hyperlipemia    Hyperlipidemia 11/11/2016   Hypertension    Numbness 01/02/2020   Other spondylosis with myelopathy, cervical region 01/19/2020   Palpitations 11/11/2016   Ringing in the ears    post covid symptom   Past Surgical History:  Procedure Laterality Date   APPENDECTOMY     BICEPT TENODESIS Left 10/28/2021   Procedure: BICEPS TENODESIS;  Surgeon: Signa Kell, MD;  Location: [facility];  Service: Orthopedics;  Laterality: Left;   BUNIONECTOMY     bilat   CERVICAL SPINE SURGERY     CESAREAN SECTION     in toe   COLONOSCOPY  03/10/2016   Dr.Stark   JOINT REPLACEMENT  2023   Left shoulder   MOUTH SURGERY  2019   implant with crown   POLYPECTOMY     REVERSE SHOULDER ARTHROPLASTY Left 10/28/2021   Procedure: Left reverse shoulder arthroplasty, biceps tenodesis;  Surgeon: Signa Kell, MD;  Location: [facility];  Service: Orthopedics;  Laterality: Left;   SHOULDER ARTHROSCOPY WITH ROTATOR CUFF REPAIR AND SUBACROMIAL DECOMPRESSION Left  07/01/2021   Procedure: Left shoulder arthroscopic subscapularis repair, mini-open supraspinatus repair, subacromial decompression, and bicepst tenodesis;  Surgeon: Signa Kell, MD;  Location[facility];  Service: Orthopedics;  Laterality: Left;   SPINE SURGERY  2015   TONSILLECTOMY     TUBAL LIGATION     UPPER GASTROINTESTINAL ENDOSCOPY     WISDOM TOOTH EXTRACTION     Social History   Tobacco Use  Smoking Status Former   Packs/day: 0.50   Years: 15.00   Additional pack years: 0.00   Total pack years: 7.50   Types: Cigarettes   Quit date: 09/30/2012   Years since quitting: 10.2  Smokeless Tobacco Never    Tavaria Mackins B Onyinyechi Huante 12/26/2022, 11:14 AM

## 2022-12-26 NOTE — Progress Notes (Signed)
[facility] PREP Weekly Session  Patient Details  Name: Caitlin Rogers MRN: [id number] Date of Birth: [birth year] Age: 67 y.o. PCP: Tollie Eth, NP  Vitals:   12/26/22 1107  Weight: 176 lb (79.8 kg)     [facility] Weekly seesion - 12/26/22 1100       [facility] "PREP" Location   [facility] "PREP" Location [name]      Weekly Session   Topic Discussed Other   How fit and strong survey completed; appt made for final assessment visit; fit testing completed.   Minutes exercised this week 150 minutes    Classes attended to date 45             Linley Moskal B Kaleem Sartwell 12/26/2022, 11:09 AM

## 2023-01-01 ENCOUNTER — Ambulatory Visit
Admission: RE | Admit: 2023-01-01 | Discharge: 2023-01-01 | Disposition: A | Discharge status: Home / Self Care | Payer: BC Managed Care – PPO | Source: Ambulatory Visit | Attending: Nurse Practitioner | Admitting: Nurse Practitioner

## 2023-01-01 DIAGNOSIS — Z1231 Encounter for screening mammogram for malignant neoplasm of breast: Secondary | ICD-10-CM | POA: Diagnosis not present

## 2023-01-28 ENCOUNTER — Other Ambulatory Visit (HOSPITAL_BASED_OUTPATIENT_CLINIC_OR_DEPARTMENT_OTHER): Payer: Self-pay | Admitting: Family

## 2023-01-29 ENCOUNTER — Telehealth: Payer: Self-pay | Admitting: Cardiovascular Disease

## 2023-01-29 MED ORDER — POTASSIUM CHLORIDE CRYS ER 10 MEQ PO TBCR: 10.0000 meq | EXTENDED_RELEASE_TABLET | Freq: Every day | ORAL | 3 refills | Status: DC

## 2023-01-29 MED ORDER — METOPROLOL SUCCINATE ER 25 MG PO TB24: 25.0000 mg | ORAL_TABLET | Freq: Every day | ORAL | 1 refills | Status: DC

## 2023-01-29 NOTE — Addendum Note (Signed)
Addended by: Regis Bill B on: 01/29/2023 11:40 AM   Modules accepted: Orders

## 2023-01-29 NOTE — Telephone Encounter (Signed)
*  STAT* If patient is at the pharmacy, call can be transferred to refill team.   1. Which medications need to be refilled? (please list name of each medication and dose if known)    metoprolol succinate (TOPROL-XL) 25 MG 24 hr tablet  ezetimibe (ZETIA) 10 MG tablet   potassium chloride SA (KLOR-CON M) 10 MEQ tablet   2. Which pharmacy/location (including street and city if local pharmacy) is medication to be sent to?   [id number] [address] [facility] - [place], [place] - [address] AT [address]    3. Do they need a 30 day or 90 day supply? 90

## 2023-01-29 NOTE — Telephone Encounter (Signed)
Spoke with [facility], Potassium transferred out resent as requested

## 2023-01-29 NOTE — Telephone Encounter (Signed)
Potassium was sent to requested pharmacy on 11/02/22---#90 with 3 refills. Ezetimibe was sent to requested pharmacy 08/07/2022---#90 with 3 refills.   --Pt should not need either of those refilled yet.  ---Rx for Metoprolol Succinate sent to requested pharmacy today.

## 2023-01-31 ENCOUNTER — Encounter (HOSPITAL_BASED_OUTPATIENT_CLINIC_OR_DEPARTMENT_OTHER): Payer: Self-pay | Admitting: Cardiovascular Disease

## 2023-02-07 DIAGNOSIS — M9902 Segmental and somatic dysfunction of thoracic region: Secondary | ICD-10-CM | POA: Diagnosis not present

## 2023-02-07 DIAGNOSIS — H814 Vertigo of central origin: Secondary | ICD-10-CM | POA: Diagnosis not present

## 2023-02-07 DIAGNOSIS — M4604 Spinal enthesopathy, thoracic region: Secondary | ICD-10-CM | POA: Diagnosis not present

## 2023-02-07 DIAGNOSIS — M9901 Segmental and somatic dysfunction of cervical region: Secondary | ICD-10-CM | POA: Diagnosis not present

## 2023-02-12 ENCOUNTER — Ambulatory Visit (INDEPENDENT_AMBULATORY_CARE_PROVIDER_SITE_OTHER): Payer: BC Managed Care – PPO | Admitting: Cardiovascular Disease

## 2023-02-12 ENCOUNTER — Encounter (HOSPITAL_BASED_OUTPATIENT_CLINIC_OR_DEPARTMENT_OTHER): Payer: Self-pay | Admitting: Cardiovascular Disease

## 2023-02-12 VITALS — BP 110/62 | HR 75 | Ht 63.0 in | Wt 175.8 lb

## 2023-02-12 DIAGNOSIS — I10 Essential (primary) hypertension: Secondary | ICD-10-CM | POA: Diagnosis not present

## 2023-02-12 DIAGNOSIS — I2510 Atherosclerotic heart disease of native coronary artery without angina pectoris: Secondary | ICD-10-CM | POA: Diagnosis not present

## 2023-02-12 DIAGNOSIS — I2584 Coronary atherosclerosis due to calcified coronary lesion: Secondary | ICD-10-CM

## 2023-02-12 DIAGNOSIS — Z6831 Body mass index (BMI) 31.0-31.9, adult: Secondary | ICD-10-CM

## 2023-02-12 DIAGNOSIS — M9901 Segmental and somatic dysfunction of cervical region: Secondary | ICD-10-CM | POA: Diagnosis not present

## 2023-02-12 DIAGNOSIS — I700 Atherosclerosis of aorta: Secondary | ICD-10-CM | POA: Diagnosis not present

## 2023-02-12 DIAGNOSIS — M4604 Spinal enthesopathy, thoracic region: Secondary | ICD-10-CM | POA: Diagnosis not present

## 2023-02-12 DIAGNOSIS — H814 Vertigo of central origin: Secondary | ICD-10-CM | POA: Diagnosis not present

## 2023-02-12 DIAGNOSIS — M9902 Segmental and somatic dysfunction of thoracic region: Secondary | ICD-10-CM | POA: Diagnosis not present

## 2023-02-12 DIAGNOSIS — T148XXA Other injury of unspecified body region, initial encounter: Secondary | ICD-10-CM | POA: Diagnosis not present

## 2023-02-12 DIAGNOSIS — M9907 Segmental and somatic dysfunction of upper extremity: Secondary | ICD-10-CM | POA: Diagnosis not present

## 2023-02-12 LAB — CBC WITH DIFFERENTIAL/PLATELET
Basophils Absolute: 0 10*3/uL (ref 0.0–0.2)
Basos: 1 %
EOS (ABSOLUTE): 0.2 10*3/uL (ref 0.0–0.4)
Eos: 5 %
Hematocrit: 31.4 % — ABNORMAL LOW (ref 34.0–46.6)
Hemoglobin: 9.7 g/dL — ABNORMAL LOW (ref 11.1–15.9)
Immature Grans (Abs): 0 10*3/uL (ref 0.0–0.1)
Immature Granulocytes: 0 %
Lymphocytes Absolute: 1.9 10*3/uL (ref 0.7–3.1)
Lymphs: 41 %
MCH: 23.8 pg — ABNORMAL LOW (ref 26.6–33.0)
MCHC: 30.9 g/dL — ABNORMAL LOW (ref 31.5–35.7)
MCV: 77 fL — ABNORMAL LOW (ref 79–97)
Monocytes Absolute: 0.5 10*3/uL (ref 0.1–0.9)
Monocytes: 10 %
Neutrophils Absolute: 2.1 10*3/uL (ref 1.4–7.0)
Neutrophils: 43 %
Platelets: 176 10*3/uL (ref 150–450)
RBC: 4.07 x10E6/uL (ref 3.77–5.28)
RDW: 16.6 % — ABNORMAL HIGH (ref 11.7–15.4)
WBC: 4.7 10*3/uL (ref 3.4–10.8)

## 2023-02-12 NOTE — Patient Instructions (Addendum)
Medication Instructions:  Your physician recommends that you continue on your current medications as directed. Please refer to the Current Medication list given to you today.   *If you need a refill on your cardiac medications before your next appointment, please call your pharmacy*  Lab Work: CBC TODAY   If you have labs (blood work) drawn today and your tests are completely normal, you will receive your results only by: MyChart Message (if you have MyChart) OR A paper copy in the mail If you have any lab test that is abnormal or we need to change your treatment, we will call you to review the results.  Testing/Procedures: NONE  Follow-Up: At [facility], you and your health needs are our priority.  As part of our continuing mission to provide you with exceptional heart care, we have created designated Provider Care Teams.  These Care Teams include your primary Cardiologist (physician) and Advanced Practice Providers (APPs -  Physician Assistants and Nurse Practitioners) who all work together to provide you with the care you need, when you need it.  We recommend signing up for the patient portal called "MyChart".  Sign up information is provided on this After Visit Summary.  MyChart is used to connect with patients for Virtual Visits (Telemedicine).  Patients are able to view lab/test results, encounter notes, upcoming appointments, etc.  Non-urgent messages can be sent to your provider as well.   To learn more about what you can do with MyChart, go to [email or web address].    Your next appointment:   12 month(s)  Provider:   Chilton Si, MD or Gillian Shields, NP    You have been referred to  Ambulatory referral to [facility]) Where: [facility] Address: [address] [place] [address] Phone: [phone]  IF YOU DO NOT HEAR FROM THE OFFICE IN 2 WEEKS YOU CAN CALL THEM DIRECTLY AT THE ABOVE  NUMBER

## 2023-02-12 NOTE — Progress Notes (Signed)
Cardiology Office Note:  .    Date:  02/12/2023  ID:  Caitlin Rogers, DOB 1956-03-06, MRN [id number] PCP: Tollie Eth, NP  [facility] Providers Cardiologist:  Chilton Si, MD     History of Present Illness: .    Caitlin Rogers is a 67 y.o. female with coronary calcification, hypertension, hyperlipidemia, prior tobacco abuse, and gastroesophageal reflux disease who is being seen for follow up. She was first seen 11/10/2016 for the evaluation of palpitations at the request of Caitlin Child, MD.  [name] saw Dr. Duanne Rogers on 10/10/16. She reported an episode of palpitations with a heart rate in the 160s.  She moved here from [place]. She established care with Dr. Duanne Rogers in 2018 and her blood pressure and cholesterol were both elevated.  Her metoprolol was discontinued and she was started on losartan.   The episode occurred soon after stopping metoprolol and lasted for several hours.  It was associated with shortness of breath but no chest pain. She had been taking metoprolol for many years after she had an episode of palpitations while being treated for H pylori.      She reported sporadic episodes of chest tightness ongoing for a month which she thought could be related to pollen allergies. She noted having mild exertional shortness of breath. Her blood pressure was 110s-140s systolic at home. Metoprolol succinate was resumed at 25 mg daily. An ETT was ordered to evaluate for ischemia. She was not seen again by cardiology until she saw Dr. Tomie Rogers in 2021 and reported palpitations. She wore a monitor that showed rare PAC's and PVC's. Nuclear stress test revealed LVEF >65% and no ischemia. She had mild LVH on Echo but it was otherwise unremarkable. She saw Dr. Tomie Rogers on 01/21/2021 and he noted that her LFTs were elevated. He recommended to decrease alcohol intake and increase exercise.    She followed up with Caitlin Shields, NP on 01/2022 and her blood pressure was 114/60. She had some  hypotensive episodes so olmesartan-HCTZ was reduced to 20-12.5 mg. She called the office and reported fatigue, so she was advised to take 1/2 tablet of her olmesartan-HCTZ. After one week she presented a blood pressure log via MyChart, which showed more frequent low blood pressures. Her olmesartan-HCTZ 20-12.5 mg was switched to olmesartan 5 mg daily without the HCTZ component. On 04/10/22 she reported higher blood pressures and ankle edema, so she was started on HCTZ 12.5 mg QD.  At her visit 04/2022 rosuvastatin was increased due to her elevated coronary calcium score in the 94th percentile.  She was also referred back to the prep program at the [facility].  However her LFTs increased so rosuvastatin was decreased and Zetia was added.  She had a virtual visit with Caitlin Shields, NP 08/2022 reporting lower extremity edema and low urine output after a hip surgery.  Symptoms did not improve after 3 days of furosemide.  It was thought to be mostly due to venous insufficiency.  She started back on HCTZ and olmesartan was discontinued.  [name] presents with ongoing recovery from a fall in September 2022, which resulted in a right hip replacement in January. She reports persistent issues with the left hip and has sought chiropractic care for assistance. The patient also reports chronic pain, managed with Advil, but is seeking alternatives due to concerns about long-term kidney damage.  In addition to hip issues, the patient has been experiencing pain in the feet, the cause of which is currently unknown. She  has also noticed increased bruising, which she attributes to aspirin use. The patient has been proactive in managing her health, completing the PREP course at the [facility] and planning to start working with a trainer.  The patient expresses frustration with her weight, which has plateaued at 170 lbs despite efforts to maintain a healthy diet and exercise. She is considering meeting with a nutritionist for a more  prescriptive meal plan.  She notes that her diet is not great.  She loves carbs and hasn't been as active since her fall.   The patient is also planning a cruise trip in mid-August and is concerned about her ability to walk and enjoy the trip due to her orthopedic issues.       ROS:  +hip pain +shoulder pain  Studies Reviewed: Marland Kitchen   EKG Interpretation Date/Time:  Monday February 12 2023 08:43:25 EDT Ventricular Rate:  75 PR Interval:  200 QRS Duration:  78 QT Interval:  378 QTC Calculation: 422 R Axis:   14  Text Interpretation: Normal sinus rhythm Normal ECG When compared with ECG of 14-Oct-2021 14:11, No significant change was found Confirmed by Chilton Si ([id number]) on 02/12/2023 8:49:38 AM   02/12/23: Sinus rhythm.  Rate 75 bpm.  Risk Assessment/Calculations:             Physical Exam:    VS:  BP 110/62 (BP Location: Right Arm, Patient Position: Sitting, Cuff Size: Normal)   Pulse 75   Ht 5\' 3"  (1.6 m)   Wt 175 lb 12.8 oz (79.7 kg)   BMI 31.14 kg/m  , BMI Body mass index is 31.14 kg/m. GENERAL:  Well appearing HEENT: Pupils equal round and reactive, fundi not visualized, oral mucosa unremarkable NECK:  No jugular venous distention, waveform within normal limits, carotid upstroke brisk and symmetric, no bruits, no thyromegaly LUNGS:  Clear to auscultation bilaterally HEART:  RRR.  PMI not displaced or sustained,S1 and S2 within normal limits, no S3, no S4, no clicks, no rubs, no murmurs ABD:  Flat, positive bowel sounds normal in frequency in pitch, no bruits, no rebound, no guarding, no midline pulsatile mass, no hepatomegaly, no splenomegaly EXT:  2 plus pulses throughout, no edema, no cyanosis no clubbing SKIN:  No rashes no nodules NEURO:  Cranial nerves II through XII grossly intact, motor grossly intact throughout PSYCH:  Cognitively intact, oriented to person place and time  ASSESSMENT AND PLAN: .    # CAD:  # Hyperlipidemia:  Calcium score 92nd percentile in  2022.  She and is currently on rosuvastatin, Zetia, hydrochlorothiazide, and metoprolol.  Recommend increasing exercise to at least 150 minutes weekly.    # Obesity:  # Weight Management: Patient expresses frustration with weight plateau. Currently engaging in exercise and planning to work with a trainer. -Encourage continuation of exercise regimen and work with a Psychologist, educational. -Recommend meeting with a nutritionist and focusing on a diet rich in lean proteins and vegetables, with limited carb intake. -Refer to [facility] for further support. -Check Hbg A1c   # Hip Pain: Chronic pain following right hip replacement in January 2024 and ongoing issues with the left hip. Patient is seeking chiropractic care for gait issues and is considering physical therapy. Overuse of Advil for pain management. -Recommend more Tylenol and less Advil for pain management, with Advil for breakthrough pain only. -Encourage continuation of chiropractic care and consideration of physical therapy. -Consider referral to [facility] for a more prescriptive meal  plan and metabolism assessment.  Bruising: Patient reports easy bruising, potentially related to aspirin use. -Order CBC to assess blood counts and platelet levels. -Recommend that she continue aspirin for CAD despite bruising side effect.   -Continue current medications and follow-up in a year.             Dispo: f/u 1 year  Signed, Chilton Si, MD

## 2023-02-13 ENCOUNTER — Encounter (HOSPITAL_BASED_OUTPATIENT_CLINIC_OR_DEPARTMENT_OTHER): Payer: Self-pay | Admitting: Cardiovascular Disease

## 2023-02-15 DIAGNOSIS — M9901 Segmental and somatic dysfunction of cervical region: Secondary | ICD-10-CM | POA: Diagnosis not present

## 2023-02-15 DIAGNOSIS — M4604 Spinal enthesopathy, thoracic region: Secondary | ICD-10-CM | POA: Diagnosis not present

## 2023-02-15 DIAGNOSIS — H814 Vertigo of central origin: Secondary | ICD-10-CM | POA: Diagnosis not present

## 2023-02-15 DIAGNOSIS — M9902 Segmental and somatic dysfunction of thoracic region: Secondary | ICD-10-CM | POA: Diagnosis not present

## 2023-02-15 DIAGNOSIS — M9907 Segmental and somatic dysfunction of upper extremity: Secondary | ICD-10-CM | POA: Diagnosis not present

## 2023-02-19 ENCOUNTER — Telehealth: Payer: Self-pay | Admitting: Nurse Practitioner

## 2023-02-19 NOTE — Telephone Encounter (Signed)
Caitlin Rogers called and scheduled a virtual with you next Monday 8/5. She is asking if you can look over the blood work that was done by her cardiologist. She was told she was anemic and to follow up with her pcp. So she is looking to discuss this and what can help her due to feeling very tired all day.

## 2023-02-19 NOTE — Telephone Encounter (Signed)
Would you mind reviewing these labs since MD out of office

## 2023-02-20 DIAGNOSIS — M9907 Segmental and somatic dysfunction of upper extremity: Secondary | ICD-10-CM | POA: Diagnosis not present

## 2023-02-20 DIAGNOSIS — M9902 Segmental and somatic dysfunction of thoracic region: Secondary | ICD-10-CM | POA: Diagnosis not present

## 2023-02-20 DIAGNOSIS — M9901 Segmental and somatic dysfunction of cervical region: Secondary | ICD-10-CM | POA: Diagnosis not present

## 2023-02-20 DIAGNOSIS — H814 Vertigo of central origin: Secondary | ICD-10-CM | POA: Diagnosis not present

## 2023-02-20 DIAGNOSIS — M4604 Spinal enthesopathy, thoracic region: Secondary | ICD-10-CM | POA: Diagnosis not present

## 2023-02-22 ENCOUNTER — Telehealth: Payer: Self-pay | Admitting: Cardiovascular Disease

## 2023-02-22 DIAGNOSIS — E782 Mixed hyperlipidemia: Secondary | ICD-10-CM

## 2023-02-22 DIAGNOSIS — M4604 Spinal enthesopathy, thoracic region: Secondary | ICD-10-CM | POA: Diagnosis not present

## 2023-02-22 DIAGNOSIS — M9901 Segmental and somatic dysfunction of cervical region: Secondary | ICD-10-CM | POA: Diagnosis not present

## 2023-02-22 DIAGNOSIS — M9907 Segmental and somatic dysfunction of upper extremity: Secondary | ICD-10-CM | POA: Diagnosis not present

## 2023-02-22 DIAGNOSIS — H814 Vertigo of central origin: Secondary | ICD-10-CM | POA: Diagnosis not present

## 2023-02-22 DIAGNOSIS — M9902 Segmental and somatic dysfunction of thoracic region: Secondary | ICD-10-CM | POA: Diagnosis not present

## 2023-02-22 MED ORDER — ROSUVASTATIN CALCIUM 20 MG PO TABS
20.0000 mg | ORAL_TABLET | Freq: Every day | ORAL | 3 refills | Status: DC
Start: 2023-02-22 — End: 2024-03-03

## 2023-02-22 NOTE — Telephone Encounter (Signed)
Rx request sent to pharmacy.  

## 2023-02-22 NOTE — Telephone Encounter (Signed)
*  STAT* If patient is at the pharmacy, call can be transferred to refill team.   1. Which medications need to be refilled? (please list name of each medication and dose if known) rosuvastatin (CRESTOR) 20 MG tablet    2. Would you like to learn more about the convenience, safety, & potential cost savings by using the [facility]? No   3. Are you open to using the [facility] (Type [facility]. )No   4. Which pharmacy/location (including street and city if local pharmacy) is medication to be sent to? [facility] [address] - [place], [place] - [address]     5. Do they need a 30 day or 90 day supply? 90 day

## 2023-02-26 ENCOUNTER — Telehealth (INDEPENDENT_AMBULATORY_CARE_PROVIDER_SITE_OTHER): Payer: BC Managed Care – PPO | Admitting: Nurse Practitioner

## 2023-02-26 ENCOUNTER — Encounter: Payer: Self-pay | Admitting: Nurse Practitioner

## 2023-02-26 VITALS — BP 130/70 | Wt 170.0 lb

## 2023-02-26 DIAGNOSIS — D649 Anemia, unspecified: Secondary | ICD-10-CM | POA: Diagnosis not present

## 2023-02-26 DIAGNOSIS — R7303 Prediabetes: Secondary | ICD-10-CM | POA: Diagnosis not present

## 2023-02-26 DIAGNOSIS — I10 Essential (primary) hypertension: Secondary | ICD-10-CM

## 2023-02-26 DIAGNOSIS — Z6830 Body mass index (BMI) 30.0-30.9, adult: Secondary | ICD-10-CM

## 2023-02-26 DIAGNOSIS — M25552 Pain in left hip: Secondary | ICD-10-CM

## 2023-02-26 DIAGNOSIS — I25118 Atherosclerotic heart disease of native coronary artery with other forms of angina pectoris: Secondary | ICD-10-CM | POA: Insufficient documentation

## 2023-02-26 DIAGNOSIS — F320 Major depressive disorder, single episode, mild: Secondary | ICD-10-CM | POA: Diagnosis not present

## 2023-02-26 DIAGNOSIS — F0781 Postconcussional syndrome: Secondary | ICD-10-CM

## 2023-02-26 HISTORY — DX: Postconcussional syndrome: F07.81

## 2023-02-26 HISTORY — DX: Anemia, unspecified: D64.9

## 2023-02-26 MED ORDER — BUPROPION HCL ER (XL) 150 MG PO TB24
150.0000 mg | ORAL_TABLET | Freq: Every day | ORAL | 3 refills | Status: DC
Start: 2023-02-26 — End: 2023-05-31

## 2023-02-26 NOTE — Assessment & Plan Note (Signed)
Blood pressure slightly elevated at 130/70 today. Recommend close monitoring and report if BP remains elevated above 120/80 for evaluation of medication.  Plan: - Monitor BP at home intermittently - Report BP readings that are consistently higher than 120/80 to cardiology or this office for recommendations.

## 2023-02-26 NOTE — Assessment & Plan Note (Signed)
Reported symptoms of depression with no alarm symptoms at this time related to ongoing health issues since the fall in 2022. We discussed the normalization of these symptoms when health concerns are present and the increased risk of mood changes following a head injury. Medication management also discussed.  Plan: - Start wellbutrin 150mg  daily as she has tolerated this medication in the past - Monitor closely for any signs of worsening mood or anxiety and report immediately - Will will plan to follow-up in about 6 weeks to see how you are doing on the medication. This can be a virtual visit.

## 2023-02-26 NOTE — Patient Instructions (Signed)
WEIGHT LOSS PLANNING Your progress today shows:     02/26/2023    8:04 AM 02/12/2023    8:37 AM 12/26/2022   11:10 AM  Vitals with BMI  Height  5\' 3"    Weight 170 lbs 175 lbs 13 oz 176 lbs  BMI 30.12 31.15   Systolic 130 110 409  Diastolic 70 62 66  Pulse  75 89    For best management of weight, it is vital to balance intake versus output. This means the number of calories burned per day must be less than the calories you take in with food and drink.   I recommend trying to follow a diet with the following: Calories: 1200-1500 calories per day Carbohydrates: 150-180 grams of carbohydrates per day  Why: Gives your body enough "quick fuel" for cells to maintain normal function without sending them into starvation mode.  Protein: At least 90 grams of protein per day- 30 grams with each meal Why: Protein takes longer and uses more energy than carbohydrates to break down for fuel. The carbohydrates in your meals serves as quick energy sources and proteins help use some of that extra quick energy to break down to produce long term energy. This helps you not feel hungry as quickly and protein breakdown burns calories.  Water: Drink AT LEAST 64 ounces of water per day  Why: Water is essential to healthy metabolism. Water helps to fill the stomach and keep you fuller longer. Water is required for healthy digestion and filtering of waste in the body.  Fat: Limit fats in your diet- when choosing fats, choose foods with lower fats content such as lean meats (chicken, fish, [place]).  Why: Increased fat intake leads to storage "for later". Once you burn your carbohydrate energy, your body goes into fat and protein breakdown mode to help you loose weight.  Cholesterol: Fats and oils that are LIQUID at room temperature are best. Choose vegetable oils (olive oil, avocado oil, nuts). Avoid fats that are SOLID at room temperature (animal fats, processed meats). Healthy fats are often found in whole grains,  beans, nuts, seeds, and berries.  Why: Elevated cholesterol levels lead to build up of cholesterol on the inside of your blood vessels. This will eventually cause the blood vessels to become hard and can lead to high blood pressure and damage to your organs. When the blood flow is reduced, but the pressure is high from cholesterol buildup, parts of the cholesterol can break off and form clots that can go to the brain or heart leading to a stroke or heart attack.  Fiber: Increase amount of SOLUBLE the fiber in your diet. This helps to fill you up, lowers cholesterol, and helps with digestion. Some foods high in soluble fiber are oats, peas, beans, apples, carrots, barley, and citrus fruits.   Why: Fiber fills you up, helps remove excess cholesterol, and aids in healthy digestion which are all very important in weight management.   I recommend the following as a minimum activity routine: Purposeful walk or other physical activity at least 20 minutes every single day. This means purposefully taking a walk, jog, bike, swim, treadmill, elliptical, dance, etc.  This activity should be ABOVE your normal daily activities, such as walking at work. Goal exercise should be at least 150 minutes a week- work your way up to this.   Heart Rate: Your maximum exercise heart rate should be 220 - Your Age in Years. When exercising, get your heart rate up, but avoid  going over the maximum targeted heart rate.  60-70% of your maximum heart rate is where you tend to burn the most fat. To find this number:  220 - Age In Years= Max HR  Max HR x 0.6 (or 0.7) = Fat Burning HR The Fat Burning HR is your goal heart rate while working out to burn the most fat.  NEVER exercise to the point your feel lightheaded, weak, nauseated, dizzy. If you experience ANY of these symptoms- STOP exercise! Allow yourself to cool down and your heart rate to come down. Then restart slower next time.  If at ANY TIME you feel chest pain or chest  pressure during exercise, STOP IMMEDIATELY and seek medical attention.

## 2023-02-26 NOTE — Assessment & Plan Note (Signed)
Difficulty with weight management despite diet and exercise changes. She does have a history of prediabetes, which I suspect is a contributing factor.  Plan: - Will recheck A1c with repeat labs in 3-4 weeks - Recommendations for diet and exercise provided on AVS - We could consider medication management with phentermine, however, given elevation in her BP this will need to be monitored closely. Will await lab results to make final decision.

## 2023-02-26 NOTE — Assessment & Plan Note (Signed)
Recent finding of anemia on labs through cardiology with no evidence of blood loss present at this time. Etiology is unclear currently. She mentions reducing her red meat intake to help with cholesterol. She has been taking iron supplementation since learning the results and reports she feels better. She has had some concerns with bruising, but platelets were normal on labs.  Plan: - Continue iron and vitamin D and we will plan to repeat these labs in about 3-4 weeks once you have returned from your trip.  - Monitor for any signs of bleeding - If anemia is still present, we may need to consider GI evaluation to ensure that there is no silent loss of blood in the GI tract.

## 2023-02-26 NOTE — Assessment & Plan Note (Signed)
Chronic brain fog, depression, and slowed mentation following head injury with LOC in 2022. She is currently managed with chiropractic care. I suspect this is contributing to her depressive symptoms.  Plan: - start wellbutrin and monitor mood closely - continue to follow with chiropractor as this has been helpful.

## 2023-02-26 NOTE — Progress Notes (Signed)
Virtual Visit Encounter mychart visit.   I connected with  Caitlin Rogers on 02/26/23 at  8:15 AM EDT by secure video and audio telemedicine application. I verified that I am speaking with the correct person using two identifiers.   I introduced myself as a Publishing rights manager with the practice. The limitations of evaluation and management by telemedicine discussed with the patient and the availability of in person appointments. The patient expressed verbal understanding and consent to proceed.  Participating parties in this visit include: Myself and patient  The patient is: Patient Location: Home I am: Provider Location: Office/Clinic Subjective:    CC and HPI: Caitlin Rogers is a 67 y.o. year old female presenting for new evaluation and treatment of Recent labs from Cardiology and hip pain.   Labs [place] recently had labs with Dr. Duke Salvia (cardiology) after experiencing some bruising after sitting on her hip while working in her pantry. Her CBC did show anemia present, which is worse than it has been in the past. She has not had any dark or black stools or signs of bleeding. Her platelets were normal at the time.   Following lab results she started taking extra vitamin D (10,000 daily), vitamin A, a "[place]" grassfed liver supplement, and an iron supplement (ferous gluconate 28mg  daily). She started all of these on 02/14/2023. She tells me since starting these supplements she tells me she is feeling better.   Hip Pain She tells me her hip and back have been bothering her since a fall in September 2022. She reports falling into a chair where she hit her hips and shoulder during the fall and additionally hit her head and loss consciousness. She has been seeing a [place] with Therapist, music for assistance in management of her condition. The chiropractor feels that she had a contusion on the brain due to the symptoms she was experiencing, such as decreased  concentration and slowed thought processing, and headache, per the patient.  She tells me the chiropractor is working with eye coordination which she feels is helpful.   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 She did have surgery on the right hip, but has not had surgery on the left hip. She is seeing Dr. Winona Legato for a cortisone injection into the left hip tomorrow before she goes on her vacation in a week.   Depression She tells me since her fall in September of 2022 she feels like she is dealing with continual/residual issues associated with this. She tells me that this has had a negative impact on her emotionally and she has been experiencing depressive symptoms recently. She has been working out, which has been helpful for her.   She has taken sertraline in the past, which she did not have a great experience with. She has also used bupropion when she quit smoking and this was very effective. She would like to know if there is a safe medication that she could take to help with her mood. She denies SI/HI. Marland Kitchen  She briefly mentions concerns with her weight and difficulty losing weight despite diet and exercise. She has not been on medication management for this.   Past medical history, Surgical history, Family history not pertinant except as noted below, Social history, Allergies, and medications have been entered into the medical record, reviewed, and corrections made.   Review of Systems:  All review of systems negative except what is listed in  the HPI  Objective:    Alert and oriented x 4 Speaking in clear sentences with no shortness of breath. No distress.  Impression and Recommendations:    Problem List Items Addressed This Visit     Left hip pain   Anemia - Primary   Relevant Orders   CBC with Differential   Comprehensive metabolic panel   Iron, TIBC and Ferritin Panel   Depression, major, single episode, mild  (HCC)   Relevant Medications   buPROPion (WELLBUTRIN XL) 150 MG 24 hr tablet   Other Visit Diagnoses     Pre-diabetes       Relevant Orders   Hemoglobin A1c       orders and follow up as documented in EMR I discussed the assessment and treatment plan with the patient. The patient was provided an opportunity to ask questions and all were answered. The patient agreed with the plan and demonstrated an understanding of the instructions.   The patient was advised to call back or seek an in-person evaluation if the symptoms worsen or if the condition fails to improve as anticipated.  Follow-Up: 3-4 weeks for labs and 6-8 weeks for med management- wellbutrin new start.   I provided 47 minutes of non-face-to-face interaction with this non face-to-face encounter including intake, same-day documentation, and chart review.   Tollie Eth, NP , DNP, AGNP-c [facility]

## 2023-02-26 NOTE — Assessment & Plan Note (Signed)
Chronic hip pain following accident with fall and loss of consciousness. At this time there are no alarm symptoms present. She is scheduled for an injection to help with inflammation and pain before her trip next week.  Plan: - Continue with injection - Monitor for any new or worsening symptoms - Consider discussing surgical and non-surgical options with orthopedics for complete evaluation.

## 2023-02-27 DIAGNOSIS — M7062 Trochanteric bursitis, left hip: Secondary | ICD-10-CM | POA: Diagnosis not present

## 2023-02-28 ENCOUNTER — Telehealth: Payer: Self-pay | Admitting: Cardiovascular Disease

## 2023-02-28 NOTE — Telephone Encounter (Signed)
Pt c/o medication issue:  1. Name of Medication:    2. How are you currently taking this medication (dosage and times per day)? TAKE ONE TABLET BY MOUTH ONE TIME DAILY   3. Are you having a reaction (difficulty breathing--STAT)? No  4. What is your medication issue? Patient called stating that she did not see this medication on her list, but she has been taking this medication. Patient would like to know if she is supposed to take this medication. Patient just got a refill on 08/02 for the medication. Please advise.

## 2023-02-28 NOTE — Telephone Encounter (Signed)
Spoke with patient stated she never stopped his Olmesartan in February as recommended by Ronn Melena NP Has been taking the hydrochlorothiazide and Olmesartan 5 mg daily Was seen by Dr Duke Salvia in July  Does not check her blood pressure at home regularly Denies any SBP readings below/close to 100 or dizzy/lightheadedness  Started Mobic 15 mg daily today given by orthopedist Advised to monitor blood pressure daily and call back if low readings  Advised to continue current medications and will forward to Ronn Melena NP and Dr Duke Salvia to review

## 2023-03-01 NOTE — Telephone Encounter (Signed)
Labs 10/2022 stable renal function. If tolerating, continue present doses olmesartan and hydrochlorothiazide.  Alver Sorrow, NP

## 2023-03-01 NOTE — Telephone Encounter (Signed)
Rx added back to list

## 2023-03-16 ENCOUNTER — Telehealth (INDEPENDENT_AMBULATORY_CARE_PROVIDER_SITE_OTHER): Payer: BC Managed Care – PPO | Admitting: Medical

## 2023-03-16 VITALS — BP 128/70 | HR 74 | Wt 170.0 lb

## 2023-03-16 DIAGNOSIS — R051 Acute cough: Secondary | ICD-10-CM | POA: Diagnosis not present

## 2023-03-16 DIAGNOSIS — U071 COVID-19: Secondary | ICD-10-CM | POA: Diagnosis not present

## 2023-03-16 DIAGNOSIS — R110 Nausea: Secondary | ICD-10-CM | POA: Diagnosis not present

## 2023-03-16 MED ORDER — PAXLOVID (300/100) 20 X 150 MG & 10 X 100MG PO TBPK: 1.0000 | ORAL_TABLET | Freq: Two times a day (BID) | ORAL | 0 refills | Status: AC

## 2023-03-16 MED ORDER — PROMETHAZINE-DM 6.25-15 MG/5ML PO SYRP: 5.0000 mL | ORAL_SOLUTION | Freq: Four times a day (QID) | ORAL | 0 refills | Status: DC | PRN

## 2023-03-16 NOTE — Patient Instructions (Signed)
Covid infection, covid illness general recommendations:  I recommend you rest, hydrate well with water and clear fluids throughout the day.  If you feel dry in the mouth, tongue or feel that you are urinating as much as usual, then increase hydration.  You urine should be like yellow to clear, not dark yellow or darker.     Pain, body aches, or fever: You can use Tylenol /Acetaminophen 325mg  over the counter for pain or fever, every 4-6 hours    Cough: You can use Promethazine DM cough syrup for cough.  This medication can also help with nausea.  This medication can cause drowsiness, so use caution.   Don't take this medication and drive or operate machinery.   Cough and congestion: You can use over the counter Mucinex DM or Coricidin HBP for cough and congestion as well.  Alternate with Promethazine DM cough syrup at least 4 hours   Drainage and congestion: You can use over the counter antihistamine such as zyrtec, allegra, or benadryl as directed on the label   Nausea: You can use Promethazine DM cough syrup prescription as needed every 6 hours for nausea.  Caution as this can cause drowsiness   Antiviral medication: Begin medication Paxlovid to help reduce your risk of hospitalization or severe illness.  If medicaiton is not available, too expensive or not covered by insurance, then call us back.   Other supportive measures: I recommend extra vitamins to help your body fight the illness.   Consider over the counter vitamin pack such as EmergenC Immune Plus which contains extra vitamin C, vitamin D and zinc.     In the next few days, if you are having trouble breathing, if you are very weak, have high fever 103 or higher consistently despite Tylenol, or uncontrollable nausea and vomiting, then call or go to the emergency department.    If you have other questions or have other symptoms or questions you are concerned about then please make a virtual visit   Covid symptoms  such as fatigue and cough can linger over 2 weeks, even after the initial fever, aches, chills, and other initial symptoms.   Self Quarantine: The CDC, Centers for Disease Control has recommended a self quarantine of 5 days from the start of your illness until you are symptom-free including at least 24 hours of no symptoms including no fever, no shortness of breath, and no body aches and chills, by day 5 before returning to work or general contact with the public.  What does self quarantine mean: avoiding contact with people as much as possible.   Particularly in your house, isolate your self from others in a separate room, wear a mask when possible in the room, particularly if coughing a lot.   Have others bring food, water, medications, etc., to your door, but avoid direct contact with your household contacts during this time to avoid spreading the infection to them.   If you have a separate bathroom and living quarters during the next 2 weeks away from others, that would be preferable.    If you can't completely isolate, then wear a mask, wash hands frequently with soap and water for at least 15 seconds, minimize close contact with others, and have a friend or family member check regularly from a distance to make sure you are not getting seriously worse.     You should not be going out in public, should not be going to stores, to work or other public places until  all your symptoms have resolved and at least 5 days + 24 hours of no symptoms at all have transpired.   Ideally you should avoid contact with others for a full 5 days if possible.  One of the goals is to limit spread to high risk people; people that are older and elderly, people with multiple health issues like diabetes, heart disease, lung disease, and anybody that has weakened immune systems such as people with cancer or on immunosuppressive therapy.

## 2023-03-16 NOTE — Progress Notes (Signed)
Subjective:     Patient ID: Caitlin Rogers, female   DOB: [birth year], 67 y.o.   MRN: [id number]  This visit type was conducted due to national recommendations for restrictions regarding the COVID-19 Pandemic (e.g. social distancing) in an effort to limit this patient's exposure and mitigate transmission in our community.  Due to their co-morbid illnesses, this patient is at least at moderate risk for complications without adequate follow up.  This format is felt to be most appropriate for this patient at this time.    Documentation for virtual audio and video telecommunications through [place] encounter:  The patient was located at home. The provider was located in the office. The patient did consent to this visit and is aware of possible charges through their insurance for this visit.  The other persons participating in this telemedicine service were none. Time spent on call was 20 minutes and in review of previous records 20 minutes total.  This virtual service is not related to other E/M service within previous 7 days.   HPI Chief Complaint  Patient presents with   Covid Positive    August 17th being exposed on a river cruise. Got Sick around 19th. And tested positive this morning    Virtual for illness.  She notes being on a river cruise recently in [place].  Got sick on 03/12/23.  Was around a lot of people 03/10/23.   Tested positive for covid this morning.  Started 4 days ago with headache, fever, achy, sinus pressure.  Got home last night around 1am from the [place] trip.  Husband stared getting sick 2 days ago with similar symptoms.  Currently she notes headache, sneezing, runny nose.  Cough is a little better.  No SOB, no wheezing.  Has had some nausea.  No vomiting, no diarrhea.  Using tylenol.  Using sinuprep OTC from [place], but this hasn't helped.  No other aggravating or relieving factors. No other complaint.   Past Medical History:  Diagnosis Date   Allergy 2015   Itchy    Anemia 02/26/2023   Anxiety    Aortic atherosclerosis (HCC) 09/29/2020   On coronary CT    Arthritis    Atypical chest pain 11/11/2016   Cancer (HCC) 03/2022   Basal carcinoma mohs 04/18/22   Cardiac murmur 01/19/2020   Cataract 2023   Removed both eyes   Cervical radiculopathy 02/13/2020   Chest discomfort 01/19/2020   Coronary artery calcification 10/07/2020   COVID-19 06/2019   Current moderate episode of major depressive disorder without prior episode (HCC) 07/06/2020   Dermatitis 11/01/2020   Encounter for pre-operative cardiovascular clearance 07/18/2022   Essential hypertension 11/11/2016   GERD (gastroesophageal reflux disease) 11/11/2016   Hyperlipemia    Hyperlipidemia 11/11/2016   Hypertension    Numbness 01/02/2020   Other spondylosis with myelopathy, cervical region 01/19/2020   Palpitations 11/11/2016   Ringing in the ears    post covid symptom   Current Outpatient Medications on File Prior to Visit  Medication Sig Dispense Refill   ALPRAZolam (XANAX) 0.5 MG tablet TAKE ONE TABLET BY MOUTH NO MORE THAN TWICE A DAY FOR SEVERE ANXIETY 60 tablet 0   aspirin EC 81 MG tablet Take 81 mg by mouth daily. Swallow whole.     buPROPion (WELLBUTRIN XL) 150 MG 24 hr tablet Take 1 tablet (150 mg total) by mouth daily. 30 tablet 3   ezetimibe (ZETIA) 10 MG tablet Take 1 tablet (10 mg total) by mouth daily. 90 tablet 3  hydrochlorothiazide (HYDRODIURIL) 25 MG tablet Take 1 tablet (25 mg total) by mouth daily. 90 tablet 3   metoprolol succinate (TOPROL-XL) 25 MG 24 hr tablet Take 1 tablet (25 mg total) by mouth daily. 90 tablet 1   olmesartan (BENICAR) 5 MG tablet Take 5 mg by mouth daily.     omeprazole (PRILOSEC) 40 MG capsule Take 1 capsule (40 mg total) by mouth daily. 90 capsule 3   potassium chloride (KLOR-CON M) 10 MEQ tablet Take 1 tablet (10 mEq total) by mouth daily. 90 tablet 3   rosuvastatin (CRESTOR) 20 MG tablet Take 1 tablet (20 mg total) by mouth daily. 90 tablet  3   diclofenac Sodium (VOLTAREN) 1 % GEL Apply 1 application  topically 4 (four) times daily as needed (pain). (Patient not taking: Reported on 03/16/2023)     Docusate Sodium (DSS) 100 MG CAPS Take 1 capsule by mouth daily. (Patient not taking: Reported on 03/16/2023)     No current facility-administered medications on file prior to visit.     Review of Systems As in subjective    Objective:   Physical Exam Due to coronavirus pandemic stay at home measures, patient visit was virtual and they were not examined in person.   BP 128/70   Pulse 74   Wt 170 lb (77.1 kg)   SpO2 96%   BMI 30.11 kg/m   Gen: wd, wn, nad No labored breathing or wheezing      Assessment:     Encounter Diagnoses  Name Primary?   COVID Yes   Acute cough    Nausea        Plan:     Covid infection, covid illness general recommendations:  I recommend you rest, hydrate well with water and clear fluids throughout the day.  If you feel dry in the mouth, tongue or feel that you are urinating as much as usual, then increase hydration.  You urine should be like yellow to clear, not dark yellow or darker.     Pain, body aches, or fever: You can use Tylenol /Acetaminophen 325mg  over the counter for pain or fever, every 4-6 hours    Cough: You can use Promethazine DM cough syrup for cough.  This medication can also help with nausea.  This medication can cause drowsiness, so use caution.   Don't take this medication and drive or operate machinery.   Cough and congestion: You can use over the counter Mucinex DM or Coricidin HBP for cough and congestion as well.  Alternate with Promethazine DM cough syrup at least 4 hours   Drainage and congestion: You can use over the counter antihistamine such as zyrtec, allegra, or benadryl as directed on the label   Nausea: You can use Promethazine DM cough syrup prescription as needed every 6 hours for nausea.  Caution as this can cause drowsiness   Antiviral  medication: Begin medication Paxlovid to help reduce your risk of hospitalization or severe illness.  If medicaiton is not available, too expensive or not covered by insurance, then call us back.   Other supportive measures: I recommend extra vitamins to help your body fight the illness.   Consider over the counter vitamin pack such as EmergenC Immune Plus which contains extra vitamin C, vitamin D and zinc.     In the next few days, if you are having trouble breathing, if you are very weak, have high fever 103 or higher consistently despite Tylenol, or uncontrollable nausea and vomiting, then call or  go to the emergency department.    If you have other questions or have other symptoms or questions you are concerned about then please make a virtual visit   Covid symptoms such as fatigue and cough can linger over 2 weeks, even after the initial fever, aches, chills, and other initial symptoms.   Self Quarantine: The CDC, Centers for Disease Control has recommended a self quarantine of 5 days from the start of your illness until you are symptom-free including at least 24 hours of no symptoms including no fever, no shortness of breath, and no body aches and chills, by day 5 before returning to work or general contact with the public.  What does self quarantine mean: avoiding contact with people as much as possible.   Particularly in your house, isolate your self from others in a separate room, wear a mask when possible in the room, particularly if coughing a lot.   Have others bring food, water, medications, etc., to your door, but avoid direct contact with your household contacts during this time to avoid spreading the infection to them.   If you have a separate bathroom and living quarters during the next 2 weeks away from others, that would be preferable.    If you can't completely isolate, then wear a mask, wash hands frequently with soap and water for at least 15 seconds, minimize close contact  with others, and have a friend or family member check regularly from a distance to make sure you are not getting seriously worse.     You should not be going out in public, should not be going to stores, to work or other public places until all your symptoms have resolved and at least 5 days + 24 hours of no symptoms at all have transpired.   Ideally you should avoid contact with others for a full 5 days if possible.  One of the goals is to limit spread to high risk people; people that are older and elderly, people with multiple health issues like diabetes, heart disease, lung disease, and anybody that has weakened immune systems such as people with cancer or on immunosuppressive therapy.    Wilton was seen today for covid positive.  Diagnoses and all orders for this visit:  COVID  Acute cough  Nausea  Other orders -     nirmatrelvir & ritonavir (PAXLOVID, 300/100,) 20 x 150 MG & 10 x 100MG  TBPK; Take 1 tablet by mouth in the morning and at bedtime for 5 days. -     promethazine-dextromethorphan (PROMETHAZINE-DM) 6.25-15 MG/5ML syrup; Take 5 mLs by mouth 4 (four) times daily as needed for cough.  F/u prn

## 2023-03-23 DIAGNOSIS — M4604 Spinal enthesopathy, thoracic region: Secondary | ICD-10-CM | POA: Diagnosis not present

## 2023-03-23 DIAGNOSIS — H814 Vertigo of central origin: Secondary | ICD-10-CM | POA: Diagnosis not present

## 2023-03-23 DIAGNOSIS — M9901 Segmental and somatic dysfunction of cervical region: Secondary | ICD-10-CM | POA: Diagnosis not present

## 2023-03-23 DIAGNOSIS — M9902 Segmental and somatic dysfunction of thoracic region: Secondary | ICD-10-CM | POA: Diagnosis not present

## 2023-03-27 ENCOUNTER — Other Ambulatory Visit (HOSPITAL_BASED_OUTPATIENT_CLINIC_OR_DEPARTMENT_OTHER): Payer: Self-pay | Admitting: Nurse Practitioner

## 2023-03-27 DIAGNOSIS — F419 Anxiety disorder, unspecified: Secondary | ICD-10-CM

## 2023-03-27 DIAGNOSIS — F321 Major depressive disorder, single episode, moderate: Secondary | ICD-10-CM

## 2023-03-27 DIAGNOSIS — R7303 Prediabetes: Secondary | ICD-10-CM | POA: Diagnosis not present

## 2023-03-27 DIAGNOSIS — D649 Anemia, unspecified: Secondary | ICD-10-CM | POA: Diagnosis not present

## 2023-03-28 ENCOUNTER — Encounter: Payer: Self-pay | Admitting: Nurse Practitioner

## 2023-03-28 DIAGNOSIS — I10 Essential (primary) hypertension: Secondary | ICD-10-CM

## 2023-03-28 DIAGNOSIS — R011 Cardiac murmur, unspecified: Secondary | ICD-10-CM

## 2023-03-28 DIAGNOSIS — E782 Mixed hyperlipidemia: Secondary | ICD-10-CM

## 2023-03-28 DIAGNOSIS — R7303 Prediabetes: Secondary | ICD-10-CM

## 2023-03-28 DIAGNOSIS — I700 Atherosclerosis of aorta: Secondary | ICD-10-CM

## 2023-03-28 DIAGNOSIS — I25118 Atherosclerotic heart disease of native coronary artery with other forms of angina pectoris: Secondary | ICD-10-CM

## 2023-03-28 DIAGNOSIS — I2510 Atherosclerotic heart disease of native coronary artery without angina pectoris: Secondary | ICD-10-CM

## 2023-03-28 DIAGNOSIS — E669 Obesity, unspecified: Secondary | ICD-10-CM

## 2023-03-29 DIAGNOSIS — M9907 Segmental and somatic dysfunction of upper extremity: Secondary | ICD-10-CM | POA: Diagnosis not present

## 2023-03-29 DIAGNOSIS — H814 Vertigo of central origin: Secondary | ICD-10-CM | POA: Diagnosis not present

## 2023-03-29 DIAGNOSIS — M9901 Segmental and somatic dysfunction of cervical region: Secondary | ICD-10-CM | POA: Diagnosis not present

## 2023-03-29 DIAGNOSIS — M4604 Spinal enthesopathy, thoracic region: Secondary | ICD-10-CM | POA: Diagnosis not present

## 2023-03-29 DIAGNOSIS — M9902 Segmental and somatic dysfunction of thoracic region: Secondary | ICD-10-CM | POA: Diagnosis not present

## 2023-03-29 MED ORDER — SEMAGLUTIDE(0.25 OR 0.5MG/DOS) 2 MG/1.5ML ~~LOC~~ SOPN: PEN_INJECTOR | SUBCUTANEOUS | 0 refills | Status: DC

## 2023-03-30 ENCOUNTER — Telehealth: Payer: Self-pay

## 2023-03-30 DIAGNOSIS — Z6830 Body mass index (BMI) 30.0-30.9, adult: Secondary | ICD-10-CM

## 2023-03-30 DIAGNOSIS — I700 Atherosclerosis of aorta: Secondary | ICD-10-CM

## 2023-03-30 DIAGNOSIS — I2510 Atherosclerotic heart disease of native coronary artery without angina pectoris: Secondary | ICD-10-CM

## 2023-03-30 DIAGNOSIS — I10 Essential (primary) hypertension: Secondary | ICD-10-CM

## 2023-03-30 DIAGNOSIS — E782 Mixed hyperlipidemia: Secondary | ICD-10-CM

## 2023-03-30 DIAGNOSIS — R7303 Prediabetes: Secondary | ICD-10-CM

## 2023-03-30 MED ORDER — TIRZEPATIDE-WEIGHT MANAGEMENT 2.5 MG/0.5ML ~~LOC~~ SOLN
2.5000 mg | SUBCUTANEOUS | 0 refills | Status: DC
Start: 2023-03-30 — End: 2023-09-04

## 2023-03-30 NOTE — Telephone Encounter (Signed)
PA not covered for CAD or pre-diabetes. Only covered for type 2.

## 2023-03-30 NOTE — Telephone Encounter (Signed)
Will you please send wegovy or zepbound?

## 2023-04-02 DIAGNOSIS — H814 Vertigo of central origin: Secondary | ICD-10-CM | POA: Diagnosis not present

## 2023-04-02 DIAGNOSIS — M9907 Segmental and somatic dysfunction of upper extremity: Secondary | ICD-10-CM | POA: Diagnosis not present

## 2023-04-02 DIAGNOSIS — M9902 Segmental and somatic dysfunction of thoracic region: Secondary | ICD-10-CM | POA: Diagnosis not present

## 2023-04-02 DIAGNOSIS — M4604 Spinal enthesopathy, thoracic region: Secondary | ICD-10-CM | POA: Diagnosis not present

## 2023-04-02 DIAGNOSIS — M9901 Segmental and somatic dysfunction of cervical region: Secondary | ICD-10-CM | POA: Diagnosis not present

## 2023-04-10 ENCOUNTER — Telehealth: Payer: Self-pay

## 2023-04-10 NOTE — Telephone Encounter (Signed)
PA denied for Ozempic (both dx: pre-diabetes and CAD) and Zepbound (dx: obesity). Denied as plan exclusions for weight loss medicines. (We did include her heart related dx)

## 2023-04-10 NOTE — Telephone Encounter (Signed)
Key: [id number] PA Case ID #: [id number] Status: Sent iconSent to Plan today Drug: Zepbound 2.5MG /0.5ML pen-injectors Form: [employer] Form [phone] NCPDP)

## 2023-04-12 NOTE — Telephone Encounter (Signed)
Caitlin Rogers called and said she spoke with the pharmacy and they told her some tips on how the medication can be approved. She says they faxed over a form today but I don't see it has been received yet. Can you call her with an update on her PA she says she is worried.

## 2023-04-13 NOTE — Telephone Encounter (Signed)
Spoke with pt. Notified her of denial to plan exclusions and that we have not received a fax from pharmacy. States she will have the pharmacy resend fax. Suggested that she call her insurance to see if there are exceptions to the plan exclusions or any medications they will cover.

## 2023-04-16 NOTE — Telephone Encounter (Signed)
Pt called requesting another electronic rx of Semaglutide sent to the [facility] in [place], [place] on [address]. Pt was advised to have the prescriber put "ok to use compound" in the rx comments so that she is able to get it filled. Are you okay with writing this?

## 2023-04-17 NOTE — Telephone Encounter (Signed)
Pt states she was told that the difference with getting a compounded semaglutide was having to draw and administer the medication herself. 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 If this was a concern.

## 2023-04-26 DIAGNOSIS — M9905 Segmental and somatic dysfunction of pelvic region: Secondary | ICD-10-CM | POA: Diagnosis not present

## 2023-04-26 DIAGNOSIS — M9903 Segmental and somatic dysfunction of lumbar region: Secondary | ICD-10-CM | POA: Diagnosis not present

## 2023-04-26 DIAGNOSIS — M9901 Segmental and somatic dysfunction of cervical region: Secondary | ICD-10-CM | POA: Diagnosis not present

## 2023-04-26 DIAGNOSIS — M9907 Segmental and somatic dysfunction of upper extremity: Secondary | ICD-10-CM | POA: Diagnosis not present

## 2023-04-26 DIAGNOSIS — M9902 Segmental and somatic dysfunction of thoracic region: Secondary | ICD-10-CM | POA: Diagnosis not present

## 2023-04-26 DIAGNOSIS — M4604 Spinal enthesopathy, thoracic region: Secondary | ICD-10-CM | POA: Diagnosis not present

## 2023-04-26 DIAGNOSIS — H814 Vertigo of central origin: Secondary | ICD-10-CM | POA: Diagnosis not present

## 2023-05-10 DIAGNOSIS — Z96612 Presence of left artificial shoulder joint: Secondary | ICD-10-CM | POA: Diagnosis not present

## 2023-05-22 DIAGNOSIS — L905 Scar conditions and fibrosis of skin: Secondary | ICD-10-CM | POA: Diagnosis not present

## 2023-05-22 DIAGNOSIS — L820 Inflamed seborrheic keratosis: Secondary | ICD-10-CM | POA: Diagnosis not present

## 2023-05-22 DIAGNOSIS — Z85828 Personal history of other malignant neoplasm of skin: Secondary | ICD-10-CM | POA: Diagnosis not present

## 2023-05-23 DIAGNOSIS — Z96612 Presence of left artificial shoulder joint: Secondary | ICD-10-CM | POA: Diagnosis not present

## 2023-05-23 DIAGNOSIS — Z961 Presence of intraocular lens: Secondary | ICD-10-CM | POA: Diagnosis not present

## 2023-05-23 DIAGNOSIS — S42252D Displaced fracture of greater tuberosity of left humerus, subsequent encounter for fracture with routine healing: Secondary | ICD-10-CM | POA: Diagnosis not present

## 2023-05-23 DIAGNOSIS — H5212 Myopia, left eye: Secondary | ICD-10-CM | POA: Diagnosis not present

## 2023-05-28 DIAGNOSIS — M7062 Trochanteric bursitis, left hip: Secondary | ICD-10-CM | POA: Diagnosis not present

## 2023-05-29 ENCOUNTER — Ambulatory Visit
Admission: RE | Admit: 2023-05-29 | Discharge: 2023-05-29 | Disposition: A | Discharge status: Home / Self Care | Payer: BC Managed Care – PPO | Source: Ambulatory Visit | Attending: Orthopedic Surgery | Admitting: Orthopedic Surgery

## 2023-05-29 ENCOUNTER — Other Ambulatory Visit: Payer: Self-pay | Admitting: Orthopedic Surgery

## 2023-05-29 DIAGNOSIS — M25552 Pain in left hip: Secondary | ICD-10-CM | POA: Diagnosis not present

## 2023-05-30 ENCOUNTER — Other Ambulatory Visit: Payer: Self-pay | Admitting: Nurse Practitioner

## 2023-05-30 DIAGNOSIS — F320 Major depressive disorder, single episode, mild: Secondary | ICD-10-CM

## 2023-05-30 NOTE — Telephone Encounter (Signed)
Last apt 03/22/23.

## 2023-06-02 ENCOUNTER — Other Ambulatory Visit (HOSPITAL_BASED_OUTPATIENT_CLINIC_OR_DEPARTMENT_OTHER): Payer: Self-pay | Admitting: Family

## 2023-06-02 DIAGNOSIS — E782 Mixed hyperlipidemia: Secondary | ICD-10-CM

## 2023-06-04 ENCOUNTER — Other Ambulatory Visit (HOSPITAL_BASED_OUTPATIENT_CLINIC_OR_DEPARTMENT_OTHER): Payer: Self-pay | Admitting: *Deleted

## 2023-06-04 DIAGNOSIS — E782 Mixed hyperlipidemia: Secondary | ICD-10-CM

## 2023-06-04 MED ORDER — EZETIMIBE 10 MG PO TABS
10.0000 mg | ORAL_TABLET | Freq: Every day | ORAL | 2 refills | Status: DC
Start: 2023-06-04 — End: 2024-04-25

## 2023-06-11 DIAGNOSIS — M87852 Other osteonecrosis, left femur: Secondary | ICD-10-CM | POA: Diagnosis not present

## 2023-06-13 DIAGNOSIS — M25552 Pain in left hip: Secondary | ICD-10-CM | POA: Diagnosis not present

## 2023-06-17 ENCOUNTER — Other Ambulatory Visit: Payer: Self-pay | Admitting: Nurse Practitioner

## 2023-06-18 ENCOUNTER — Other Ambulatory Visit: Payer: Self-pay

## 2023-06-18 ENCOUNTER — Telehealth: Payer: Self-pay | Admitting: Nurse Practitioner

## 2023-06-18 DIAGNOSIS — F419 Anxiety disorder, unspecified: Secondary | ICD-10-CM

## 2023-06-18 DIAGNOSIS — F321 Major depressive disorder, single episode, moderate: Secondary | ICD-10-CM

## 2023-06-18 MED ORDER — OLMESARTAN MEDOXOMIL 5 MG PO TABS
5.0000 mg | ORAL_TABLET | Freq: Every day | ORAL | 1 refills | Status: DC
  Filled 2023-06-18: qty 90, 90d supply, fill #0

## 2023-06-18 NOTE — Telephone Encounter (Signed)
Fax refill  [facility]  Alprazolam  .5  #60 last filled 03/27/23

## 2023-06-19 ENCOUNTER — Encounter: Payer: Self-pay | Admitting: Nurse Practitioner

## 2023-06-19 ENCOUNTER — Other Ambulatory Visit: Payer: Self-pay | Admitting: Nurse Practitioner

## 2023-06-19 ENCOUNTER — Other Ambulatory Visit: Payer: Self-pay

## 2023-06-19 DIAGNOSIS — F419 Anxiety disorder, unspecified: Secondary | ICD-10-CM

## 2023-06-19 DIAGNOSIS — F321 Major depressive disorder, single episode, moderate: Secondary | ICD-10-CM

## 2023-06-19 MED ORDER — ALPRAZOLAM 0.5 MG PO TABS: ORAL_TABLET | ORAL | 0 refills | Status: DC

## 2023-06-19 MED ORDER — OLMESARTAN MEDOXOMIL 5 MG PO TABS: 5.0000 mg | ORAL_TABLET | Freq: Every day | ORAL | 1 refills | Status: DC

## 2023-06-19 NOTE — Telephone Encounter (Signed)
Last apt 02/26/23.

## 2023-06-20 DIAGNOSIS — M25512 Pain in left shoulder: Secondary | ICD-10-CM | POA: Diagnosis not present

## 2023-06-20 DIAGNOSIS — Z96612 Presence of left artificial shoulder joint: Secondary | ICD-10-CM | POA: Diagnosis not present

## 2023-06-20 DIAGNOSIS — G8929 Other chronic pain: Secondary | ICD-10-CM | POA: Diagnosis not present

## 2023-06-26 DIAGNOSIS — M25552 Pain in left hip: Secondary | ICD-10-CM | POA: Diagnosis not present

## 2023-06-27 DIAGNOSIS — M25552 Pain in left hip: Secondary | ICD-10-CM | POA: Diagnosis not present

## 2023-07-02 DIAGNOSIS — G8929 Other chronic pain: Secondary | ICD-10-CM | POA: Diagnosis not present

## 2023-07-02 DIAGNOSIS — M25512 Pain in left shoulder: Secondary | ICD-10-CM | POA: Diagnosis not present

## 2023-07-11 ENCOUNTER — Telehealth: Payer: Self-pay | Admitting: *Deleted

## 2023-07-11 ENCOUNTER — Telehealth: Payer: Self-pay

## 2023-07-11 NOTE — Telephone Encounter (Signed)
   Name: Caitlin Rogers  DOB: 12-Nov-1955  MRN: [id number]  Primary Cardiologist: Chilton Si, MD   Preoperative team, please contact this patient and set up a phone call appointment for further preoperative risk assessment. Please obtain consent and complete medication review. Thank you for your help.  I confirm that guidance regarding antiplatelet and oral anticoagulation therapy has been completed and, if necessary, noted below.  Her aspirin may be held for 5 to 7 days prior to her procedure.  Please resume as soon as hemostasis is achieved.  I also confirmed the patient resides in the state of [place]. As per [facility] Medical Board telemedicine laws, the patient must reside in the state in which the provider is licensed.   Ronney Asters, NP 07/11/2023, 9:01 AM [facility]

## 2023-07-11 NOTE — Telephone Encounter (Signed)
  Patient Consent for Virtual Visit         Caitlin Rogers has provided verbal consent on 07/11/2023 for a virtual visit (video or telephone).   CONSENT FOR VIRTUAL VISIT FOR:  Caitlin Rogers  By participating in this virtual visit I agree to the following:  I hereby voluntarily request, consent and authorize [facility] and its employed or contracted physicians, physician assistants, nurse practitioners or other licensed health care professionals (the Practitioner), to provide me with telemedicine health care services (the "Services") as deemed necessary by the treating Practitioner. I acknowledge and consent to receive the Services by the Practitioner via telemedicine. I understand that the telemedicine visit will involve communicating with the Practitioner through live audiovisual communication technology and the disclosure of certain medical information by electronic transmission. I acknowledge that I have been given the opportunity to request an in-person assessment or other available alternative prior to the telemedicine visit and am voluntarily participating in the telemedicine visit.  I understand that I have the right to withhold or withdraw my consent to the use of telemedicine in the course of my care at any time, without affecting my right to future care or treatment, and that the Practitioner or I may terminate the telemedicine visit at any time. I understand that I have the right to inspect all information obtained and/or recorded in the course of the telemedicine visit and may receive copies of available information for a reasonable fee.  I understand that some of the potential risks of receiving the Services via telemedicine include:  Delay or interruption in medical evaluation due to technological equipment failure or disruption; Information transmitted may not be sufficient (e.g. poor resolution of images) to allow for appropriate medical decision making by the Practitioner;  and/or  In rare instances, security protocols could fail, causing a breach of personal health information.  Furthermore, I acknowledge that it is my responsibility to provide information about my medical history, conditions and care that is complete and accurate to the best of my ability. I acknowledge that Practitioner's advice, recommendations, and/or decision may be based on factors not within their control, such as incomplete or inaccurate data provided by me or distortions of diagnostic images or specimens that may result from electronic transmissions. I understand that the practice of medicine is not an exact science and that Practitioner makes no warranties or guarantees regarding treatment outcomes. I acknowledge that a copy of this consent can be made available to me via my patient portal [facility] MyChart), or I can request a printed copy by calling the office of  [facility].    I understand that my insurance will be billed for this visit.   I have read or had this consent read to me. I understand the contents of this consent, which adequately explains the benefits and risks of the Services being provided via telemedicine.  I have been provided ample opportunity to ask questions regarding this consent and the Services and have had my questions answered to my satisfaction. I give my informed consent for the services to be provided through the use of telemedicine in my medical care

## 2023-07-11 NOTE — Telephone Encounter (Signed)
   Pre-operative Risk Assessment    Patient Name: Caitlin Rogers  DOB: 1956/04/06 MRN: [id number]  DATE OF LAST VISIT: 02/12/23 DR. [place] DATE OF NEXT VISIT: NONE    Request for Surgical Clearance    Procedure:   LEFT TOTAL HIP ARTHROPLASTY  Date of Surgery:  Clearance TBD                                 Surgeon:  DR. Samson Frederic Surgeon's Group or Practice Name:  [facility] Phone number:  [phone] KERRI MAZE Fax number:  [phone]   Type of Clearance Requested:   - Medical  - Pharmacy:  Hold Aspirin     Type of Anesthesia:  Spinal   Additional requests/questions:    Elpidio Anis   07/11/2023, 8:40 AM

## 2023-07-11 NOTE — Telephone Encounter (Signed)
 1st attempt to reach pt. Lvm

## 2023-07-11 NOTE — Telephone Encounter (Signed)
Patient scheduled for tele visit on 09/13/23.. med rec and consent done

## 2023-07-12 ENCOUNTER — Other Ambulatory Visit: Payer: Self-pay

## 2023-07-12 ENCOUNTER — Telehealth: Payer: Self-pay | Admitting: Nurse Practitioner

## 2023-07-12 NOTE — Telephone Encounter (Signed)
Spoke to pt to set up Cpe and surgical clearance for upcoming L hip replacement probably in March 2025  She wanted me to let you know that she stopped Wellbutrin about 1 month ago, she felt it was making things worse so she stopped it

## 2023-07-26 ENCOUNTER — Other Ambulatory Visit: Payer: Self-pay

## 2023-07-26 MED ORDER — METOPROLOL SUCCINATE ER 25 MG PO TB24: 25.0000 mg | ORAL_TABLET | Freq: Every day | ORAL | 1 refills | Status: DC

## 2023-08-27 ENCOUNTER — Telehealth (INDEPENDENT_AMBULATORY_CARE_PROVIDER_SITE_OTHER): Payer: BC Managed Care – PPO | Admitting: Nurse Practitioner

## 2023-08-27 ENCOUNTER — Encounter: Payer: Self-pay | Admitting: Nurse Practitioner

## 2023-08-27 VITALS — BP 151/84 | HR 87 | Wt 165.0 lb

## 2023-08-27 DIAGNOSIS — J0140 Acute pansinusitis, unspecified: Secondary | ICD-10-CM | POA: Diagnosis not present

## 2023-08-27 DIAGNOSIS — B9789 Other viral agents as the cause of diseases classified elsewhere: Secondary | ICD-10-CM | POA: Diagnosis not present

## 2023-08-27 HISTORY — DX: Acute pansinusitis, unspecified: J01.40

## 2023-08-27 MED ORDER — PROMETHAZINE-DM 6.25-15 MG/5ML PO SYRP: 5.0000 mL | ORAL_SOLUTION | Freq: Four times a day (QID) | ORAL | 0 refills | Status: DC | PRN

## 2023-08-27 MED ORDER — FLUCONAZOLE 150 MG PO TABS: ORAL_TABLET | ORAL | 2 refills | Status: DC

## 2023-08-27 MED ORDER — AMOXICILLIN-POT CLAVULANATE 875-125 MG PO TABS: 1.0000 | ORAL_TABLET | Freq: Two times a day (BID) | ORAL | 0 refills | Status: DC

## 2023-08-27 NOTE — Assessment & Plan Note (Signed)
Symptoms began January 27th post-cruise, including fatigue, cough with dark green mucus, sinus pressure, sore throat, and ear pressure. Negative COVID test. Suspect she may have had exposure to flu based on symptoms and now likely secondary bacterial infection post-viral illness. Augmentin recommended for efficacy. Discussed promethazine cough syrup for nighttime use and increased fluid intake to loosen mucus. Advised alternating Alka-Seltzer (contains acetaminophen) with ibuprofen for pain and fever. - Prescribe Augmentin 875 mg, twice daily with food - Prescribe Diflucan for potential yeast infection - Recommend promethazine cough syrup for nighttime use - Encourage increased fluid intake - Advise alternating Alka-Seltzer (contains acetaminophen) with ibuprofen every four hours for pain and fever

## 2023-08-27 NOTE — Progress Notes (Signed)
Virtual Visit Encounter mychart visit.   I connected with  Caitlin Rogers on 08/27/23 at  1:30 PM EST by secure video and audio telemedicine application. I verified that I am speaking with the correct person using two identifiers.   I introduced myself as a Publishing rights manager with the practice. The limitations of evaluation and management by telemedicine discussed with the patient and the availability of in person appointments. The patient expressed verbal understanding and consent to proceed.  Participating parties in this visit include: Myself and patient  The patient is: Patient Location: Home I am: Provider Location: Office/Clinic Subjective:    CC and HPI: Caitlin Rogers is a 68 y.o. year old female presenting for new evaluation and treatment of influenza like illness. Patient reports the following:  Caitlin Rogers presents with fatigue, cough, and sinus symptoms after returning from a cruise.  She has been experiencing extreme fatigue and a persistent cough since returning from a cruise on August 20, 2023. The symptoms began around August 22, 2023, with the cough producing dark green mucus and worsening over time. She also experienced a loss of voice. Her cabin mate also became ill during the cruise, although she tried to distance herself.   She reports sinus pressure, pain in her neck, sore glands, and pain behind her eyes and on the side of her head. Additionally, she noticed more fluid than usual in one of her ears, which is atypical for her.  To manage her symptoms, she has been using Mucinex and Alka-Seltzer Plus and took a dose of promethazine cough syrup from a previous prescription. She tested negative for COVID-19 upon returning home.  She has a history of tinnitus since 2020 following a COVID-19 infection.  She is scheduled for a preoperative appointment on September 04, 2023, for hip surgery, which was delayed due to a previous injection.   Past medical history, Surgical history,  Family history not pertinant except as noted below, Social history, Allergies, and medications have been entered into the medical record, reviewed, and corrections made.   Review of Systems:  All review of systems negative except what is listed in the HPI  Objective:    Alert and oriented x 4 Audible congestion and hoarseness with wet sounding cough.  Speaking in clear sentences with no shortness of breath. No distress.  Impression and Recommendations:    Problem List Items Addressed This Visit     Acute non-recurrent pansinusitis - Primary   Symptoms began January 27th post-cruise, including fatigue, cough with dark green mucus, sinus pressure, sore throat, and ear pressure. Negative COVID test. Suspect she may have had exposure to flu based on symptoms and now likely secondary bacterial infection post-viral illness. Augmentin recommended for efficacy. Discussed promethazine cough syrup for nighttime use and increased fluid intake to loosen mucus. Advised alternating Alka-Seltzer (contains acetaminophen) with ibuprofen for pain and fever. - Prescribe Augmentin 875 mg, twice daily with food - Prescribe Diflucan for potential yeast infection - Recommend promethazine cough syrup for nighttime use - Encourage increased fluid intake - Advise alternating Alka-Seltzer (contains acetaminophen) with ibuprofen every four hours for pain and fever      Relevant Medications   amoxicillin-clavulanate (AUGMENTIN) 875-125 MG tablet   fluconazole (DIFLUCAN) 150 MG tablet   promethazine-dextromethorphan (PROMETHAZINE-DM) 6.25-15 MG/5ML syrup    orders and follow up as documented in EMR I discussed the assessment and treatment plan with the patient. The patient was provided an opportunity to ask questions and all were answered. The patient agreed with  the plan and demonstrated an understanding of the instructions.   The patient was advised to call back or seek an in-person evaluation if the symptoms  worsen or if the condition fails to improve as anticipated.  Follow-Up: prn  I provided 23 minutes of non-face-to-face interaction with this non face-to-face encounter including intake, same-day documentation, and chart review.   Tollie Eth, NP , DNP, AGNP-c [facility]

## 2023-08-28 ENCOUNTER — Telehealth: Payer: BC Managed Care – PPO | Admitting: Nurse Practitioner

## 2023-08-31 ENCOUNTER — Telehealth: Payer: Self-pay | Admitting: Cardiovascular Disease

## 2023-08-31 NOTE — Telephone Encounter (Signed)
 Please see below.

## 2023-08-31 NOTE — Telephone Encounter (Signed)
 Patient calling to see if she can get a sooner phone clearance. Please advise

## 2023-09-03 NOTE — Progress Notes (Signed)
Vaccines: shingles,pneumonia,t-dap,flu, covid: Dexa: Pap:     09/04/2023   Vitals:  BP 136/80   Pulse 85   Ht 5\' 2"  (1.575 m)   Wt 167 lb 6.4 oz (75.9 kg)   BMI 30.62 kg/m   Body mass index is 30.62 kg/m. Caitlin Rogers is a 68 y.o. female who presents for Initial Medicare Annual Wellness Exam  Care Team Members: Current Providers as of 09/04/2023 PCP: Tollie Eth, NP Care Team Provider: Meryl Dare, MD Care Team Provider: Chilton Si, MD Encounter Provider: Tollie Eth, NP, starting on Tue Sep 04, 2023 12:00 AM Referring Provider: Tollie Eth, NP, starting on Tue Sep 04, 2023 12:00 AM Attending Provider: Tollie Eth, NP, starting on Fri Jul 27, 2023 11:15 AM (Active) Nurse Practitioner: Tollie Eth, NP, starting on Tue Sep 04, 2023  9:39 AM (Active)   Method of visit:  in person In the event virtual visit conducted, the patient consented to a virtual visit. Patient consented to have virtual visit and was identified by two identifiers.  Encounter participants: Patient: Caitlin Rogers - located AWV Patient Visit Location: In Office Nurse/Provider: Tollie Eth - located Virtual Visit Location Provider: Office/Clinic Others (if applicable): patient only  HPI History of Present Illness Caitlin Rogers is a 68 year old female with avascular necrosis of the hips who presents for a Medicare wellness visit and surgical clearance.  She has avascular necrosis affecting both hips. Her initial hip surgery was successful, but she now experiences pain in the other hip, leading to increased sitting and difficulty with physical activity. She takes Tylenol in the morning and Aleve in the afternoon for pain management. A recent joint injection provided relief for only two days. She plans to have surgery on the other hip soon.  Chronic pain affects her blood pressure, which was elevated during the visit. She uses a heating pad for pain relief. No dizziness or vision changes.  She has a history of cataract surgery with 20/20 vision post-procedure.  She has a history of a fall resulting in a rotator cuff tear and subsequent total shoulder replacement. She also sustained a fracture from the shoulder to the humerus, which did not require surgery but necessitated wearing a pillow sling for six weeks. She reports good range of motion post-recovery and has been working on physical therapy exercises.  She is considering using a cane to assist with mobility due to difficulty standing in the morning. Her home is equipped with safety features such as handrails and non-skid rugs, and she has recently renovated her bathroom to include grab bars.  She has not received any vaccines this year and is considering the pneumonia and flu vaccines. She had a shingles vaccine series in the past and is not interested in the COVID vaccine. She is due for a DEXA scan due to a history of bone density issues.  Review of Systems:  Neuro: Denies difficulty remembering daily tasks, people, or places.  Ear: Denies difficulty hearing or need to increase volume on television or telephone to hear Eye: Denies visual changes, difficulty reading normal print, or visual field deficits. Cardiac: Denies chest pain, palpitations, dizziness, shortness of breath, pain in lower extremities, or night time waking with shortness of breath. Lung: Denies shortness of breath, difficulty breathing, chronic cough, or dizziness.  GI: Denies changes in bowel habits, blood in stool, difficulty passing stool, decreased intake of food or drink, nausea, or vomiting.  GU: Denies changes in urinary habits,  dark urine, malodorous urine, increased or decreased urination, or urinary incontinence.  MSK: Denies weakness in extremities, difficulty walking, difficulty grasping, or new MSK pain.  Skin: Denies changes to the skin, fragile skin, or increased bruising.  Constitution: Denies fatigue, weakness, or confusion.   Patient  rating of health: better as this time last year  Clinical Intake: Pre-visit preparation completed: Yes  Pain : No/denies pain     BMI - recorded: 30.62 Nutritional Status: BMI > 30  Obese Nutritional Risks: Unintentional weight gain Diabetes: No  Activities of Daily Living: Independent Ambulation: Independent Home Management: Independent  Barriers to Care Management & Learning: None  Do you feel unsafe in your current relationship?: No Do you feel physically threatened by others?: No Anyone hurting you at home, work, or school?: No Unable to ask?: No Information provided on Community resources: No  How often do you need to have someone help you when you read instructions, pamphlets, or other written materials from your doctor or pharmacy?: 1 - Never  Interpreter Needed?: No        09/04/2023    9:51 AM 10/28/2021    5:00 PM 10/28/2021    6:33 AM 10/14/2021    1:37 PM 05/13/2021   10:35 AM 04/20/2021   11:19 AM 09/29/2020    6:06 PM  Advanced Directives  Does Patient Have a Medical Advance Directive? Yes  Yes Yes No No No  Type of Estate agent of [place];Living will Living will;Healthcare Power of [employer] Power of [place];Living will Healthcare Power of [place];Living will     Does patient want to make changes to medical advance directive?  No - Patient declined  No - Patient declined     Copy of Healthcare Power of Attorney in Chart? No - copy requested   No - copy requested     Would patient like information on creating a medical advance directive?     No - Patient declined No - Patient declined No - Patient declined    Social Determinants of Health SDOH Screenings   Food Insecurity: No Food Insecurity (09/03/2023)  Housing: Low Risk  (09/03/2023)  Transportation Needs: No Transportation Needs (09/03/2023)  Alcohol Screen: Low Risk  (09/03/2023)  Depression (PHQ2-9): Low Risk  (09/04/2023)  Financial Resource Strain: Low Risk   (09/03/2023)  Physical Activity: Insufficiently Active (09/03/2023)  Social Connections: Unknown (09/03/2023)  Stress: Stress Concern Present (09/03/2023)  Tobacco Use: Medium Risk (09/04/2023)     Functional Status Survey: Is the patient deaf or have difficulty hearing?: Yes (has some ringing in ears) Does the patient have difficulty seeing, even when wearing glasses/contacts?: No Does the patient have difficulty concentrating, remembering, or making decisions?: No Does the patient have difficulty walking or climbing stairs?: Yes Does the patient have difficulty dressing or bathing?: No Does the patient have difficulty doing errands alone such as visiting a doctor's office or shopping?: No   Annual Goal:  Goals      Patient Stated     Improvement of physical health. Going back to the gym and getting back in shape after the upcoming hip surgery.            Fall Risk    09/04/2023    9:48 AM 04/14/2022    9:49 AM  Fall Risk   Falls in the past year? 1 1  Number falls in past yr: 0 0  Injury with Fall? 1 1  Comment fractured bone in shoulder shoulder injury  Risk  for fall due to : No Fall Risks No Fall Risks  Follow up Falls evaluation completed Falls evaluation completed;Education provided   Medicare Risk  Medicare Risk at Home - 09/04/23 1016     Any stairs in or around the home? Yes    If so, are there any without handrails? No    Home free of loose throw rugs in walkways, pet beds, electrical cords, etc? Yes    Adequate lighting in your home to reduce risk of falls? Yes    Life alert? No    Use of a cane, walker or w/c? No   has one in the event her hips are really bothering her.   Grab bars in the bathroom? Yes    Shower chair or bench in shower? Yes    Elevated toilet seat or a handicapped toilet? No              Cognitive Function Normal: Yes Exam Completed:       Mini-Cog - 09/04/23 0950     Normal clock drawing test? yes    How many words correct? 3              Depression Screening    09/04/2023    9:49 AM 10/26/2021   11:21 AM 11/01/2020   10:04 AM 07/06/2020   12:46 PM  Depression screen PHQ 2/9  Decreased Interest 0 1 1 1   Down, Depressed, Hopeless 1 0 0 1  PHQ - 2 Score 1 1 1 2   Altered sleeping  2 1 1   Tired, decreased energy  1 1 1   Change in appetite  1 0 1  Feeling bad or failure about yourself   0 0 0  Trouble concentrating  1 0 1  Moving slowly or fidgety/restless  0 0 1  Suicidal thoughts  0 0 0  PHQ-9 Score  6 3 7   Difficult doing work/chores  Somewhat difficult Somewhat difficult Somewhat difficult     Activities of Daily Living    09/04/2023    9:52 AM  In your present state of health, do you have any difficulty performing the following activities:  Hearing? 1  Comment has some ringing in ears  Vision? 0  Difficulty concentrating or making decisions? 0  Walking or climbing stairs? 1  Dressing or bathing? 0  Doing errands, shopping? 0  Preparing Food and eating ? N  Using the Toilet? N  In the past six months, have you accidently leaked urine? N  Do you have problems with loss of bowel control? N  Managing your Medications? N  Managing your Finances? N  Housekeeping or managing your Housekeeping? N    Tobacco Social History   Tobacco Use  Smoking Status Former   Current packs/day: 0.00   Average packs/day: 0.5 packs/day for 15.0 years (7.5 ttl pk-yrs)   Types: Cigarettes   Start date: 09/30/1997   Quit date: 09/30/2012   Years since quitting: 10.9  Smokeless Tobacco Never     Counseling given: Not Answered   Hospitalizations in the Past Year: none  ED Visits in the Past Year: No  Surgeries in the Past Year: Yes  Left shoulder, right hip, and cataract surgery  History    Medication List Current Meds  Medication Sig   ALPRAZolam (XANAX) 0.5 MG tablet TAKE ONE TABLET BY MOUTH NO MORE THAN TWICE A DAY FOR SEVERE ANXIETY   aspirin EC 81 MG tablet Take 81 mg by mouth daily.  Swallow whole.  diclofenac Sodium (VOLTAREN) 1 % GEL Apply 1 application  topically 4 (four) times daily as needed (pain).   Docusate Sodium (DSS) 100 MG CAPS Take 1 capsule by mouth daily.   ezetimibe (ZETIA) 10 MG tablet Take 1 tablet (10 mg total) by mouth daily.   hydrochlorothiazide (HYDRODIURIL) 25 MG tablet Take 1 tablet (25 mg total) by mouth daily.   metoprolol succinate (TOPROL-XL) 25 MG 24 hr tablet Take 1 tablet (25 mg total) by mouth daily.   olmesartan (BENICAR) 5 MG tablet Take 1 tablet (5 mg total) by mouth daily.   omeprazole (PRILOSEC) 40 MG capsule Take 1 capsule (40 mg total) by mouth daily.   potassium chloride (KLOR-CON M) 10 MEQ tablet Take 1 tablet (10 mEq total) by mouth daily.   rosuvastatin (CRESTOR) 20 MG tablet Take 1 tablet (20 mg total) by mouth daily.     Immunizations Immunization History  Administered Date(s) Administered   Influenza Inj Mdck Quad Pf 05/14/2018, 05/14/2020   Influenza, Quadrivalent, Recombinant, Inj, Pf 03/15/2019   Influenza,inj,Quad PF,6+ Mos 05/23/2021   Influenza-Unspecified 03/16/2022   PFIZER(Purple Top)SARS-COV-2 Vaccination 10/11/2019, 11/01/2019, 05/14/2020   PNEUMOCOCCAL CONJUGATE-20 09/04/2023   Pneumococcal Polysaccharide-23 03/15/2019   Zoster Recombinant(Shingrix) 04/12/2018     Screening Tests Health Maintenance  Topic Date Due   DTaP/Tdap/Td (1 - Tdap) Never done   DEXA SCAN  Never done   COVID-19 Vaccine (4 - 2024-25 season) 09/20/2023 (Originally 03/25/2023)   INFLUENZA VACCINE  10/22/2023 (Originally 02/22/2023)   Zoster Vaccines- Shingrix (2 of 2) 12/02/2023 (Originally 06/07/2018)   Medicare Annual Wellness (AWV)  09/03/2024   MAMMOGRAM  12/31/2024   Colonoscopy  06/14/2028   Pneumonia Vaccine 66+ Years old  Completed   Hepatitis C Screening  Completed   HPV VACCINES  Aged Out    Health Maintenance Screenings  Health Maintenance Topics with due status: Overdue     Topic Date Due   DTaP/Tdap/Td Never done    DEXA SCAN Never done    RSV Vaccine: is due and to be scheduled by patient for later completion  Past Medical History:  Diagnosis Date   Acute non-recurrent pansinusitis 08/27/2023   Allergy 2015   Itchy   Anemia 02/26/2023   Anxiety    Aortic atherosclerosis (HCC) 09/29/2020   On coronary CT    Arthritis    Atypical chest pain 11/11/2016   Cancer (HCC) 03/2022   Basal carcinoma mohs 04/18/22   Cardiac murmur 01/19/2020   Cataract 2023   Removed both eyes   Cervical radiculopathy 02/13/2020   Chest discomfort 01/19/2020   Coronary artery calcification 10/07/2020   COVID-19 06/2019   Current moderate episode of major depressive disorder without prior episode (HCC) 07/06/2020   Dermatitis 11/01/2020   Encounter for pre-operative cardiovascular clearance 07/18/2022   Essential hypertension 11/11/2016   GERD (gastroesophageal reflux disease) 11/11/2016   Hyperlipemia    Hyperlipidemia 11/11/2016   Hypertension    Numbness 01/02/2020   Other spondylosis with myelopathy, cervical region 01/19/2020   Palpitations 11/11/2016   Ringing in the ears    post covid symptom   Rotator cuff tear 10/28/2021   Past Surgical History:  Procedure Laterality Date   APPENDECTOMY     BICEPT TENODESIS Left 10/28/2021   Procedure: BICEPS TENODESIS;  Surgeon: Signa Kell, MD;  Location: [facility];  Service: Orthopedics;  Laterality: Left;   BUNIONECTOMY     bilat   CERVICAL SPINE SURGERY     CESAREAN SECTION  in toe   COLONOSCOPY  03/10/2016   Dr.Stark   JOINT REPLACEMENT  2023   Left shoulder   MOUTH SURGERY  2019   implant with crown   POLYPECTOMY     REVERSE SHOULDER ARTHROPLASTY Left 10/28/2021   Procedure: Left reverse shoulder arthroplasty, biceps tenodesis;  Surgeon: Signa Kell, MD;  Location: [facility];  Service: Orthopedics;  Laterality: Left;   SHOULDER ARTHROSCOPY WITH ROTATOR CUFF REPAIR AND SUBACROMIAL DECOMPRESSION Left 07/01/2021   Procedure: Left shoulder  arthroscopic subscapularis repair, mini-open supraspinatus repair, subacromial decompression, and bicepst tenodesis;  Surgeon: Signa Kell, MD;  Location: [facility];  Service: Orthopedics;  Laterality: Left;   SPINE SURGERY  2015   TONSILLECTOMY     TUBAL LIGATION     UPPER GASTROINTESTINAL ENDOSCOPY     WISDOM TOOTH EXTRACTION     Family History  Problem Relation Age of Onset   Hypertension Mother    Diabetes Mellitus II Mother    Alcohol abuse Mother    Anxiety disorder Mother    Arthritis Mother    Diabetes Mother    Ronna Herskowitz death Mother    Heart disease Mother    Hyperlipidemia Mother    Obesity Mother    Hypertension Father    Stroke Father    Heart disease Father    Alcohol abuse Father    Diabetes Father    Hyperlipidemia Father    Stroke Brother    Heart disease Brother    Alcohol abuse Brother    Hyperlipidemia Brother    Hypertension Brother    CAD Brother    Heart disease Brother    Hyperlipidemia Brother    Colon cancer Paternal Aunt        30's   Heart attack Paternal Grandfather    Colon polyps Neg Hx    Esophageal cancer Neg Hx    Rectal cancer Neg Hx    Stomach cancer Neg Hx    Social History   Socioeconomic History   Marital status: Married    Spouse name: Louis   Number of children: 2   Years of education: Some college   Highest education level: Associate degree: academic program  Occupational History   Not on file  Tobacco Use   Smoking status: Former    Current packs/day: 0.00    Average packs/day: 0.5 packs/day for 15.0 years (7.5 ttl pk-yrs)    Types: Cigarettes    Start date: 09/30/1997    Quit date: 09/30/2012    Years since quitting: 10.9   Smokeless tobacco: Never  Vaping Use   Vaping status: Never Used  Substance and Sexual Activity   Alcohol use: Yes    Alcohol/week: 6.0 standard drinks of alcohol    Types: 3 Glasses of wine, 3 Standard drinks or equivalent per week   Drug use: Yes    Types: Marijuana   Sexual  activity: Yes    Birth control/protection: Post-menopausal  Other Topics Concern   Not on file  Social History Narrative   07/06/20   From: moved around a kid, moved to [facility]   Living: with Louis, husband (2005)   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      Family: 2 children - Psychiatric nurse ([place]) and Maralyn Sago ([place])       Enjoys: nothing currently      Exercise: not currently   Diet: pretty good, limits sweets, tries to eat healthy food, leftovers for lunch      Safety   Seat belts:  Yes    Guns: Yes  and secure   Safe in relationships: Yes    Social Drivers of Corporate investment banker Strain: Low Risk  (09/03/2023)   Overall Financial Resource Strain (CARDIA)    Difficulty of Paying Living Expenses: Not hard at all  Food Insecurity: No Food Insecurity (09/03/2023)   Hunger Vital Sign    Worried About Running Out of Food in the Last Year: Never true    Ran Out of Food in the Last Year: Never true  Transportation Needs: No Transportation Needs (09/03/2023)   PRAPARE - Administrator, Civil Service (Medical): No    Lack of Transportation (Non-Medical): No  Physical Activity: Insufficiently Active (09/03/2023)   Exercise Vital Sign    Days of Exercise per Week: 2 days    Minutes of Exercise per Session: 60 min  Stress: Stress Concern Present (09/03/2023)   [employer] of Occupational Health - Occupational Stress Questionnaire    Feeling of Stress : To some extent  Social Connections: Unknown (09/03/2023)   Social Connection and Isolation Panel [NHANES]    Frequency of Communication with Friends and Family: More than three times a week    Frequency of Social Gatherings with Friends and Family: Once a week    Attends Religious Services: Patient declined    Database administrator or Organizations: Patient declined    Attends Banker Meetings: Not on file    Marital Status: Married    Outpatient Encounter Medications as of 09/04/2023  Medication Sig    ALPRAZolam (XANAX) 0.5 MG tablet TAKE ONE TABLET BY MOUTH NO MORE THAN TWICE A DAY FOR SEVERE ANXIETY   aspirin EC 81 MG tablet Take 81 mg by mouth daily. Swallow whole.   diclofenac Sodium (VOLTAREN) 1 % GEL Apply 1 application  topically 4 (four) times daily as needed (pain).   Docusate Sodium (DSS) 100 MG CAPS Take 1 capsule by mouth daily.   ezetimibe (ZETIA) 10 MG tablet Take 1 tablet (10 mg total) by mouth daily.   hydrochlorothiazide (HYDRODIURIL) 25 MG tablet Take 1 tablet (25 mg total) by mouth daily.   metoprolol succinate (TOPROL-XL) 25 MG 24 hr tablet Take 1 tablet (25 mg total) by mouth daily.   olmesartan (BENICAR) 5 MG tablet Take 1 tablet (5 mg total) by mouth daily.   omeprazole (PRILOSEC) 40 MG capsule Take 1 capsule (40 mg total) by mouth daily.   potassium chloride (KLOR-CON M) 10 MEQ tablet Take 1 tablet (10 mEq total) by mouth daily.   rosuvastatin (CRESTOR) 20 MG tablet Take 1 tablet (20 mg total) by mouth daily.   [DISCONTINUED] amoxicillin-clavulanate (AUGMENTIN) 875-125 MG tablet Take 1 tablet by mouth 2 (two) times daily. (Patient not taking: Reported on 09/04/2023)   [DISCONTINUED] fluconazole (DIFLUCAN) 150 MG tablet May repeat in 3 days if symptoms not resolved (Patient not taking: Reported on 09/04/2023)   [DISCONTINUED] promethazine-dextromethorphan (PROMETHAZINE-DM) 6.25-15 MG/5ML syrup Take 5 mLs by mouth 4 (four) times daily as needed for cough. (Patient not taking: Reported on 09/04/2023)   [DISCONTINUED] Tirzepatide-Weight Management 2.5 MG/0.5ML SOLN Inject 2.5 mg into the skin once a week. (Patient not taking: Reported on 07/11/2023)   No facility-administered encounter medications on file as of 09/04/2023.    Physical Exam: Yes-For medical clearance Physical Exam Vitals and nursing note reviewed.  Constitutional:      General: She is not in acute distress.    Appearance: Normal appearance.  HENT:  Head: Normocephalic.  Eyes:     Conjunctiva/sclera:  Conjunctivae normal.     Pupils: Pupils are equal, round, and reactive to light.  Neck:     Vascular: No carotid bruit.  Cardiovascular:     Rate and Rhythm: Normal rate and regular rhythm.     Pulses: Normal pulses.     Heart sounds: Normal heart sounds.  Pulmonary:     Effort: Pulmonary effort is normal.     Breath sounds: Normal breath sounds.  Abdominal:     General: Bowel sounds are normal. There is no distension.     Palpations: Abdomen is soft.     Tenderness: There is no abdominal tenderness. There is no right CVA tenderness, left CVA tenderness or guarding.  Musculoskeletal:     Cervical back: No tenderness.     Right lower leg: No edema.     Left lower leg: No edema.  Lymphadenopathy:     Cervical: No cervical adenopathy.  Skin:    General: Skin is warm and dry.     Capillary Refill: Capillary refill takes less than 2 seconds.  Neurological:     General: No focal deficit present.     Mental Status: She is alert and oriented to person, place, and time.  Psychiatric:        Mood and Affect: Mood normal.        Behavior: Behavior normal.     PLAN  Exercise Activities and Dietary Recommendations - keep track of how long I exercise - keep track of how often I exercise - go out for a short walk before breakfast, after dinner or both - park farther away at the shopping mall and walk the extra distance - take the stairs instead of the elevator Cardiac diet  Fall Prevention - always use handrails on the stairs - always wear shoes or slippers with non-slip sole - get at least 10 minutes of activity every day - keep a flashlight by the bed - keep cell phone with me always - make an emergency alert plan in case I fall - remove, or use a non-slip pad, with my throw rugs - use a nightlight in the bathroom - wear low heeled or flat shoes with non-skid soles  Orders Placed This Encounter  Procedures   DG Bone Density    Standing Status:   Future    Expiration Date:    09/03/2024    Scheduling Instructions:     Please call patient to schedule    Reason for Exam (SYMPTOM  OR DIAGNOSIS REQUIRED):   screening bone density    Preferred imaging [facility]?:   [facility]    Release to patient:   Immediate   Pneumococcal conjugate vaccine 20-valent (Prevnar 20)   CBC with Differential/Platelet   CMP14+EGFR   Hemoglobin A1c   Lipid panel   TSH     I have personally reviewed and noted the following in the patient's chart:   Medical and social history Use of alcohol, tobacco or illicit drugs  Current medications and supplements Functional ability and status Nutritional status Physical activity Advanced directives List of other physicians Hospitalizations, surgeries, and ER visits in previous 12 months Vitals Screenings to include cognitive, depression, and falls Referrals and appointments  In addition, I have reviewed and discussed with patient certain preventive protocols, quality metrics, and best practice recommendations. A written personalized care plan for preventive services as well as general preventive health recommendations were provided to patient.   [place]ley Dec  Felix Pacini, NP  09/04/2023

## 2023-09-04 ENCOUNTER — Ambulatory Visit (INDEPENDENT_AMBULATORY_CARE_PROVIDER_SITE_OTHER): Payer: Medicare Other | Admitting: Nurse Practitioner

## 2023-09-04 ENCOUNTER — Encounter: Payer: Self-pay | Admitting: Nurse Practitioner

## 2023-09-04 VITALS — BP 136/80 | HR 85 | Ht 62.0 in | Wt 167.4 lb

## 2023-09-04 DIAGNOSIS — F325 Major depressive disorder, single episode, in full remission: Secondary | ICD-10-CM | POA: Diagnosis not present

## 2023-09-04 DIAGNOSIS — Z01818 Encounter for other preprocedural examination: Secondary | ICD-10-CM

## 2023-09-04 DIAGNOSIS — Z09 Encounter for follow-up examination after completed treatment for conditions other than malignant neoplasm: Secondary | ICD-10-CM

## 2023-09-04 DIAGNOSIS — Z0000 Encounter for general adult medical examination without abnormal findings: Secondary | ICD-10-CM

## 2023-09-04 DIAGNOSIS — Z23 Encounter for immunization: Secondary | ICD-10-CM | POA: Diagnosis not present

## 2023-09-04 DIAGNOSIS — M791 Myalgia, unspecified site: Secondary | ICD-10-CM

## 2023-09-04 DIAGNOSIS — R7303 Prediabetes: Secondary | ICD-10-CM

## 2023-09-04 DIAGNOSIS — Z13820 Encounter for screening for osteoporosis: Secondary | ICD-10-CM

## 2023-09-04 DIAGNOSIS — I10 Essential (primary) hypertension: Secondary | ICD-10-CM

## 2023-09-04 DIAGNOSIS — M25552 Pain in left hip: Secondary | ICD-10-CM

## 2023-09-04 DIAGNOSIS — Z9889 Other specified postprocedural states: Secondary | ICD-10-CM

## 2023-09-04 DIAGNOSIS — Z6830 Body mass index (BMI) 30.0-30.9, adult: Secondary | ICD-10-CM

## 2023-09-04 DIAGNOSIS — R799 Abnormal finding of blood chemistry, unspecified: Secondary | ICD-10-CM

## 2023-09-04 DIAGNOSIS — E782 Mixed hyperlipidemia: Secondary | ICD-10-CM

## 2023-09-04 NOTE — Assessment & Plan Note (Signed)
Left shoulder rotator cuff tear and total shoulder replacement. Recent fall resulted in a fracture from shoulder to humerus, treated with six weeks in a sling. Good range of motion maintained. Discussed importance of continued range of motion exercises to prevent stiffness and maintain function. - Continue range of motion exercises as tolerated - Follow up with orthopedic specialist if new symptoms arise

## 2023-09-04 NOTE — Patient Instructions (Signed)
  Caitlin Rogers , Thank you for taking time to come for your Medicare Wellness Visit. I appreciate your ongoing commitment to your health goals. Please review the following plan we discussed and let me know if I can assist you in the future.   These are the goals we discussed:  Goals      Patient Stated     Improvement of physical health. Going back to the gym and getting back in shape after the upcoming hip surgery.         This is a list of the screening recommended for you and due dates:  Health Maintenance  Topic Date Due   Medicare Annual Wellness Visit  Never done   DTaP/Tdap/Td vaccine (1 - Tdap) Never done   Pneumonia Vaccine (2 of 2 - PCV) 03/14/2020   DEXA scan (bone density measurement)  Never done   COVID-19 Vaccine (4 - 2024-25 season) 09/20/2023*   Flu Shot  10/22/2023*   Zoster (Shingles) Vaccine (2 of 2) 12/02/2023*   Mammogram  12/31/2024   Colon Cancer Screening  06/14/2028   Hepatitis C Screening  Completed   HPV Vaccine  Aged Out  *Topic was postponed. The date shown is not the original due date.

## 2023-09-04 NOTE — Assessment & Plan Note (Signed)
Slightly elevated blood pressure today, but nothing to an alarming level. Acknowledges NSAIDs like ibuprofen can elevate blood pressure and she has been taking this daily for the hip pain. Will monitor blood pressure at home twice a day for the next few days. Discussed risks of uncontrolled hypertension and benefits of monitoring. She has a follow-up with Dr. Horris Latino in the near future for her cardiac clearance, she will let her know if her pressures are remaining higher than 130/80. - Monitor blood pressure at home twice daily - Adjust antihypertensive medication if home readings remain elevated - Discuss blood pressure readings with Dr. Duke Salvia during upcoming telephone appointment

## 2023-09-04 NOTE — Assessment & Plan Note (Signed)
Ongoing right hip pain, worsened by sitting. Pain managed with Tylenol in the morning and ibuprofen in the afternoon. Recent intra-articular injection provided relief for two days. Surgery planned after 90 days post-injection. Discussed surgical risks (infection, blood clots, rehabilitation) and benefits (pain relief, improved mobility). Patient prefers surgery post-waiting period. - Continue Tylenol and ibuprofen for pain management - Scheduled hip surgery after 90-day waiting period post-injection- no concerns present today from medical standpoint that would prevent surgery. Labs are pending.

## 2023-09-05 LAB — TSH: TSH: 1.05 u[IU]/mL (ref 0.450–4.500)

## 2023-09-05 LAB — HEMOGLOBIN A1C
Est. average glucose Bld gHb Est-mCnc: 120 mg/dL
Hgb A1c MFr Bld: 5.8 % — ABNORMAL HIGH (ref 4.8–5.6)

## 2023-09-05 LAB — CMP14+EGFR
ALT: 31 [IU]/L (ref 0–32)
AST: 58 [IU]/L — ABNORMAL HIGH (ref 0–40)
Albumin: 4.4 g/dL (ref 3.9–4.9)
Alkaline Phosphatase: 103 [IU]/L (ref 44–121)
BUN/Creatinine Ratio: 14 (ref 12–28)
BUN: 11 mg/dL (ref 8–27)
Bilirubin Total: 0.3 mg/dL (ref 0.0–1.2)
CO2: 26 mmol/L (ref 20–29)
Calcium: 10.1 mg/dL (ref 8.7–10.3)
Chloride: 100 mmol/L (ref 96–106)
Creatinine, Ser: 0.77 mg/dL (ref 0.57–1.00)
Globulin, Total: 2.7 g/dL (ref 1.5–4.5)
Glucose: 89 mg/dL (ref 70–99)
Potassium: 4.4 mmol/L (ref 3.5–5.2)
Sodium: 142 mmol/L (ref 134–144)
Total Protein: 7.1 g/dL (ref 6.0–8.5)
eGFR: 85 mL/min/{1.73_m2} (ref >59)

## 2023-09-05 LAB — CBC WITH DIFFERENTIAL/PLATELET
Basophils Absolute: 0 10*3/uL (ref 0.0–0.2)
Basos: 1 %
EOS (ABSOLUTE): 0.1 10*3/uL (ref 0.0–0.4)
Eos: 2 %
Hematocrit: 37.3 % (ref 34.0–46.6)
Hemoglobin: 12.6 g/dL (ref 11.1–15.9)
Immature Grans (Abs): 0 10*3/uL (ref 0.0–0.1)
Immature Granulocytes: 0 %
Lymphocytes Absolute: 1.4 10*3/uL (ref 0.7–3.1)
Lymphs: 32 %
MCH: 31.5 pg (ref 26.6–33.0)
MCHC: 33.8 g/dL (ref 31.5–35.7)
MCV: 93 fL (ref 79–97)
Monocytes Absolute: 0.5 10*3/uL (ref 0.1–0.9)
Monocytes: 12 %
Neutrophils Absolute: 2.3 10*3/uL (ref 1.4–7.0)
Neutrophils: 53 %
Platelets: 225 10*3/uL (ref 150–450)
RBC: 4 x10E6/uL (ref 3.77–5.28)
RDW: 12.1 % (ref 11.7–15.4)
WBC: 4.2 10*3/uL (ref 3.4–10.8)

## 2023-09-05 LAB — LIPID PANEL
Chol/HDL Ratio: 2.1 {ratio} (ref 0.0–4.4)
Cholesterol, Total: 150 mg/dL (ref 100–199)
HDL: 72 mg/dL (ref >39)
LDL Chol Calc (NIH): 64 mg/dL (ref 0–99)
Triglycerides: 71 mg/dL (ref 0–149)
VLDL Cholesterol Cal: 14 mg/dL (ref 5–40)

## 2023-09-05 NOTE — Progress Notes (Signed)
[facility] Return Visit   Referring Provider Early, Sung Amabile, NP [address] [place],  [place] [address]  Primary Care Provider Early, Sung Amabile, NP  Patient Profile: Caitlin Rogers is a 68 y.o. female with a past medical history noteworthy for HTN, HLD, CAD, anxiety, arthralgia, prediabetes, depression,who returns to the [facility] for follow-up of the problem(s) noted below.  Problem List: Mildly elevated transaminases, possible statin side effect. R/O other causes GERD, well-controlled Personal history of adenomatous and sessile serrated colon polyps  Left-sided diverticulosis  History of Present Illness   Caitlin Rogers was last seen in the GI office 09/13/2022 by Dr. 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 and became jaundiced -hepatitis B surface antigen was -2021 No history of blood transfusion or high risk behaviors Laboratory workup for chronic hepatitides in 2021 -negative HBV, HCV, ceruloplasmin, ASMA, AMA, alpha 1 antitrypsin; ANA mildly elevated at 1:40 No other history of autoimmune or neuromuscular disease Her cardiologist previously attributed elevated liver enzymes to statins Consumes 2 to 3 glasses of wine per day  Caitlin Rogers has a history of adenomatous colon polyps Last colonoscopy performed November 2022 yielded two 7 mm polyps in the TC and TC consistent  with tubular adenomas Next colonoscopy due in 2029  In terms of other health issues, Caitlin Rogers had an unfortunate fall a few years ago and developed bilateral osteonecrosis of the hips She has undergone 1 total hip arthroplasty and is awaiting a second one  Last colonoscopy: 05/2021 -two 7 mm polyps in DC, TC-TA; moderate diverticulosis, IH Last endoscopy: 09/2018 -  LA Grade B esophagitis, medium HH, sessile gastric polyps  Last Abd CT/CTE/MRE: CT pelvis 05/2023 -colonic diverticulosis without diverticulitis  GI Review of Symptoms Significant for None. Otherwise negative.  General Review of Systems  Review of systems is significant for the pertinent positives and negatives as listed per the HPI.  Full ROS is otherwise negative.  Past Medical History   Past Medical History:  Diagnosis Date   Acute non-recurrent pansinusitis 08/27/2023   Allergy 2015   Itchy   Anemia 02/26/2023   Anxiety    Aortic atherosclerosis (HCC) 09/29/2020   On coronary CT    Arthritis    Atypical chest pain 11/11/2016   Cancer (HCC) 03/2022   Basal carcinoma mohs 04/18/22   Cardiac murmur 01/19/2020   Cataract 2023   Removed both eyes   Cervical radiculopathy 02/13/2020   Chest discomfort 01/19/2020   Coronary artery calcification 10/07/2020   COVID-19 06/2019   Current moderate episode of major depressive disorder without prior episode (HCC) 07/06/2020   Dermatitis 11/01/2020   Encounter for pre-operative cardiovascular clearance 07/18/2022   Essential hypertension 11/11/2016   GERD (gastroesophageal reflux disease) 11/11/2016   Hyperlipemia    Hyperlipidemia 11/11/2016   Hypertension    Numbness 01/02/2020   Other spondylosis with myelopathy, cervical region 01/19/2020   Palpitations 11/11/2016  Ringing in the ears    post covid symptom   Rotator cuff tear 10/28/2021     Past Surgical History   Past Surgical History:  Procedure Laterality Date   APPENDECTOMY     BICEPT TENODESIS  Left 10/28/2021   Procedure: BICEPS TENODESIS;  Surgeon: Signa Kell, MD;  Location: [facility] ORS;  Service: Orthopedics;  Laterality: Left;   BUNIONECTOMY     bilat   CERVICAL SPINE SURGERY     CESAREAN SECTION     in toe   COLONOSCOPY  03/10/2016   Dr.Stark   JOINT REPLACEMENT  2023   Left shoulder   MOUTH SURGERY  2019   implant with crown   POLYPECTOMY     REVERSE SHOULDER ARTHROPLASTY Left 10/28/2021   Procedure: Left reverse shoulder arthroplasty, biceps tenodesis;  Surgeon: Signa Kell, MD;  Location: [facility] ORS;  Service: Orthopedics;  Laterality: Left;   SHOULDER ARTHROSCOPY WITH ROTATOR CUFF REPAIR AND SUBACROMIAL DECOMPRESSION Left 07/01/2021   Procedure: Left shoulder arthroscopic subscapularis repair, mini-open supraspinatus repair, subacromial decompression, and bicepst tenodesis;  Surgeon: Signa Kell, MD;  Location: [facility];  Service: Orthopedics;  Laterality: Left;   SPINE SURGERY  2015   TONSILLECTOMY     TUBAL LIGATION     UPPER GASTROINTESTINAL ENDOSCOPY     WISDOM TOOTH EXTRACTION       Allergies and Medications   Allergies  Allergen Reactions   Codeine Itching   Tramadol Itching    Current Meds  Medication Sig   aspirin EC 81 MG tablet Take 81 mg by mouth daily. Swallow whole.   Docusate Sodium (DSS) 100 MG CAPS Take 1 capsule by mouth daily.   ezetimibe (ZETIA) 10 MG tablet Take 1 tablet (10 mg total) by mouth daily.   hydrochlorothiazide (HYDRODIURIL) 25 MG tablet Take 1 tablet (25 mg total) by mouth daily.   metoprolol succinate (TOPROL-XL) 25 MG 24 hr tablet Take 1 tablet (25 mg total) by mouth daily.   olmesartan (BENICAR) 5 MG tablet Take 1 tablet (5 mg total) by mouth daily.   omeprazole (PRILOSEC) 40 MG capsule Take 1 capsule (40 mg total) by mouth daily.   potassium chloride (KLOR-CON M) 10 MEQ tablet Take 1 tablet (10 mEq total) by mouth daily.   rosuvastatin (CRESTOR) 20 MG tablet Take 1 tablet (20 mg total) by mouth daily.      Family History   Family History  Problem Relation Age of Onset   Hypertension Mother    Diabetes Mellitus II Mother    Alcohol abuse Mother    Anxiety disorder Mother    Arthritis Mother    Diabetes Mother    Early death Mother    Heart disease Mother    Hyperlipidemia Mother    Obesity Mother    Hypertension Father    Stroke Father    Heart disease Father    Alcohol abuse Father    Diabetes Father    Hyperlipidemia Father    Stroke Brother    Heart disease Brother    Alcohol abuse Brother    Hyperlipidemia Brother    Hypertension Brother    CAD Brother    Heart disease Brother    Hyperlipidemia Brother    Colon cancer Paternal Aunt        30's   Heart attack Paternal Grandfather    Colon polyps Neg Hx    Esophageal cancer Neg Hx    Rectal cancer Neg Hx    Stomach cancer Neg  Hx     Social History   Social History   Tobacco Use   Smoking status: Former    Current packs/day: 0.00    Average packs/day: 0.5 packs/day for 15.0 years (7.5 ttl pk-yrs)    Types: Cigarettes    Start date: 09/30/1997    Quit date: 09/30/2012    Years since quitting: 10.9   Smokeless tobacco: Never  Vaping Use   Vaping status: Never Used  Substance Use Topics   Alcohol use: Yes    Alcohol/week: 6.0 standard drinks of alcohol    Types: 3 Glasses of wine, 3 Standard drinks or equivalent per week   Drug use: Yes    Types: Marijuana   Deserai reports that she quit smoking about 10 years ago. Her smoking use included cigarettes. She started smoking about 25 years ago. She has a 7.5 pack-year smoking history. She has never used smokeless tobacco. She reports current alcohol use of about 6.0 standard drinks of alcohol per week. She reports current drug use. Drug: Marijuana.  Vital Signs and Physical Examination   Vitals:   09/06/23 1521  BP: 124/70  Pulse: 84   Body mass index is 31.09 kg/m. Weight: 170 lb (77.1 kg)  General: Well developed, well nourished, no acute  distress Head: Normocephalic and atraumatic Eyes: Sclerae anicteric, EOMI Lungs: Clear throughout to auscultation Heart: Regular rate and rhythm; No murmurs, rubs or bruits Abdomen: Soft, non tender and non distended. No masses, hepatosplenomegaly or hernias noted. Normal Bowel sounds Rectal: Deferred Musculoskeletal: Symmetrical with no gross deformities   Review of Data  The following data was reviewed at the time of this encounter:  Laboratory Studies      Latest Ref Rng & Units 09/04/2023   10:57 AM 03/27/2023    9:22 AM 02/12/2023    9:34 AM  CBC  WBC 3.4 - 10.8 x10E3/uL 4.2  3.2  4.7   Hemoglobin 11.1 - 15.9 g/dL 16.1  09.6  9.7   Hematocrit 34.0 - 46.6 % 37.3  36.9  31.4   Platelets 150 - 450 x10E3/uL 225  290  176     No results found for: "LIPASE"    Latest Ref Rng & Units 09/04/2023   10:57 AM 03/27/2023    9:22 AM 11/21/2022   11:28 AM  CMP  Glucose 70 - 99 mg/dL 89  92  80   BUN 8 - 27 mg/dL 11  21  19    Creatinine 0.57 - 1.00 mg/dL 0.45  4.09  8.11   Sodium 134 - 144 mmol/L 142  133  138   Potassium 3.5 - 5.2 mmol/L 4.4  3.6  3.5   Chloride 96 - 106 mmol/L 100  92  93   CO2 20 - 29 mmol/L 26  26  25    Calcium 8.7 - 10.3 mg/dL 91.4  78.2  9.8   Total Protein 6.0 - 8.5 g/dL 7.1  6.9    Total Bilirubin 0.0 - 1.2 mg/dL 0.3  0.2    Alkaline Phos 44 - 121 IU/L 103  96    AST 0 - 40 IU/L 58  47    ALT 0 - 32 IU/L 31  38      Imaging Studies  CT pelvis 05/2023 1. No acute displaced fracture. 2. Prominent lower lumbar spondylosis and facet hypertrophy. 3. Unremarkable right hip arthroplasty. 4. Colonic diverticulosis without evidence of acute diverticulitis. 5.  Aortic Atherosclerosis (ICD10-I70.0).  RUQ [place] 03/2020 Unremarkable right upper quadrant  ultrasound  GI Procedures and Studies  Colonoscopy 05/2021 - Two 7 mm polyps in the descending colon and in the transverse colon, removed with a cold snare. Resected and retrieved. - Moderate diverticulosis.  -   Internal hemorrhoids.  - The examination was otherwise normal on direct and retroflexion views.  Path Tubular adenomas  EGD 09/2018 - LA Grade B reflux esophagitis.  - Medium-sized hiatal hernia.  - A few gastric polyps. Biopsied.  - Normal duodenal bulb and second portion of the duodenum.  Path Fundic gland polyp   Colonoscopy 02/29/2016 - Moderate diverticulosis in [place], DC, TC - Internal hemorrhoids  EGD 12/2012      Colonoscopy 08/2012    Path   Clinical Impression  It is my clinical impression that Ms. Wienke is a 69 y.o. female with;  Mildly elevated transaminases, possible statin side effect. R/O other causes GERD, well-controlled Personal history of adenomatous and sessile serrated colon polyps  Left-sided diverticulosis  During today's visit we reviewed Ms. Vickrey' history and pattern of elevated hepatic transaminases.  Her AST and ALT have been mildly elevated for the past few years.  This was initially attributed to statin use.  Workup for chronic hepatitides in 2021 was unrevealing with the exception of a weakly positive ANA.  At today's visit we discussed rounding out laboratory evaluation for autoimmune causes of liver disease including celiac disease and obtaining a total IgG.  Because of the AST predominance of her elevated liver enzymes I also suggested checking a CK to rule out neuromuscular issues.  We reviewed the impact of medication and alcohol on the liver.  Ms. Kuchenbecker' GERD is currently well-controlled on omeprazole 40 mg orally daily without any alarm features.  She has a pertinent history of nonadvanced colon tubular adenomas and sessile serrated polyps.  Last colonoscopy in November 2022 disclosed evidence of 2 tubular adenomas.  She is due for her next colonoscopy in 2029.  Plan  Labs today: Total IgG, celiac panel, CK Limit hepatotoxic medications and substances Continue omeprazole 40 mg orally daily in conjunction with dietary  modification Next surveillance colonoscopy due 2029 Monitor weight and anthropometrics  Planned Follow Up  1 year  The patient or caregiver verbalized understanding of the material covered, with no barriers to understanding. All questions were answered. Patient or caregiver is agreeable with the plan outlined above.    It was a pleasure to see Dacoda.  If you have any questions or concerns regarding this evaluation, do not hesitate to contact me.  Maren Beach, MD [facility]   I spent total of 30 minutes in both face-to-face and non-face-to-face activities, excluding procedures performed, for the visit on the date of this encounter.

## 2023-09-06 ENCOUNTER — Encounter: Payer: Self-pay | Admitting: Pediatrics

## 2023-09-06 ENCOUNTER — Other Ambulatory Visit (INDEPENDENT_AMBULATORY_CARE_PROVIDER_SITE_OTHER): Payer: Medicare Other

## 2023-09-06 ENCOUNTER — Ambulatory Visit (INDEPENDENT_AMBULATORY_CARE_PROVIDER_SITE_OTHER): Payer: BC Managed Care – PPO | Admitting: Pediatrics

## 2023-09-06 VITALS — BP 124/70 | HR 84 | Ht 62.0 in | Wt 170.0 lb

## 2023-09-06 DIAGNOSIS — Z860100 Personal history of colon polyps, unspecified: Secondary | ICD-10-CM

## 2023-09-06 DIAGNOSIS — R7989 Other specified abnormal findings of blood chemistry: Secondary | ICD-10-CM | POA: Diagnosis not present

## 2023-09-06 DIAGNOSIS — Z860101 Personal history of adenomatous and serrated colon polyps: Secondary | ICD-10-CM | POA: Diagnosis not present

## 2023-09-06 DIAGNOSIS — R7401 Elevation of levels of liver transaminase levels: Secondary | ICD-10-CM

## 2023-09-06 DIAGNOSIS — K219 Gastro-esophageal reflux disease without esophagitis: Secondary | ICD-10-CM

## 2023-09-06 DIAGNOSIS — K5730 Diverticulosis of large intestine without perforation or abscess without bleeding: Secondary | ICD-10-CM

## 2023-09-06 LAB — CK: Total CK: 58 U/L (ref 7–177)

## 2023-09-06 NOTE — Patient Instructions (Signed)
Your provider has requested that you go to the basement level for lab work before leaving today. Press "B" on the elevator. The lab is located at the first door on the left as you exit the elevator.  Please follow up in one year.  _______________________________________________________  If your blood pressure at your visit was 140/90 or greater, please contact your primary care physician to follow up on this.  _______________________________________________________  If you are age 34 or older, your body mass index should be between 23-30. Your Body mass index is 31.09 kg/m. If this is out of the aforementioned range listed, please consider follow up with your Primary Care Provider.  If you are age 1 or younger, your body mass index should be between 19-25. Your Body mass index is 31.09 kg/m. If this is out of the aformentioned range listed, please consider follow up with your Primary Care Provider.   ________________________________________________________  [facility] providers would like to encourage you to use [facility] to communicate with providers for non-urgent requests or questions.  Due to long hold times on the telephone, sending your provider a message by [facility] may be a faster and more efficient way to get a response.  Please allow 48 business hours for a response.  Please remember that this is for non-urgent requests.  _______________________________________________________

## 2023-09-07 ENCOUNTER — Encounter: Payer: Self-pay | Admitting: Nurse Practitioner

## 2023-09-07 LAB — TISSUE TRANSGLUTAMINASE, IGA: (tTG) Ab, IgA: <1 U/mL

## 2023-09-07 LAB — IGA: Immunoglobulin A: 245 mg/dL (ref 70–320)

## 2023-09-07 LAB — IGG: IgG (Immunoglobin G), Serum: 1029 mg/dL (ref 600–1540)

## 2023-09-10 ENCOUNTER — Encounter: Payer: Self-pay | Admitting: Pediatrics

## 2023-09-11 NOTE — Telephone Encounter (Signed)
S/w the pt and we have changed tele preop appt time 09/13/23 from 10 am to 11:20 as pt states she has been waiting a long time and does not want to go out to a further tele appt date.

## 2023-09-12 NOTE — Progress Notes (Unsigned)
Virtual Visit via Telephone Note   Because of Miciah Shealy co-morbid illnesses, she is at least at moderate risk for complications without adequate follow up.  This format is felt to be most appropriate for this patient at this time.  Due to technical limitations with video connection (technology), today's appointment will be conducted as an audio only telehealth visit, and Tonda Wiederhold verbally agreed to proceed in this manner.   All issues noted in this document were discussed and addressed.  No physical exam could be performed with this format.  Evaluation Performed:  Preoperative cardiovascular risk assessment _____________   Date:  09/13/2023   Patient ID:  Caitlin Rogers, DOB 1955-09-21, MRN [id number] Patient Location:  Home Provider location:   Office  Primary Care Provider:  Tollie Eth, NP Primary Cardiologist:  Chilton Si, MD  Chief Complaint / Patient Profile   68 y.o. y/o female with a h/o coronary calcification, hypertension, hyperlipidemia, prior tobacco abuse, and gastroesophageal reflux disease, palpitations, chest pain,and obesity.  Nuclear stress test revealed LVEF >65% and no ischemia. She had mild LVH on Echo but it was otherwise unremarkable.   She is pending left total hip arthroplasty by Dr. Samson Frederic on date to be determined, and presents today for telephonic preoperative cardiovascular risk assessment.  History of Present Illness    Shey Yott is a 68 y.o. female who presents via audio/video conferencing for a telehealth visit today.  Pt was last seen in cardiology clinic on 02/12/2023 by Dr. Duke Salvia.  At that time Jaymie Mckiddy was doing well weight loss was recommended she was to follow-up in 1 year.  The patient is now pending procedure as outlined above. Since her last visit, she has done well from a cardiac standpoint. She denies any cardiac complaints.  She is not very physically active due to left hip pain.   Past Medical History     Past Medical History:  Diagnosis Date   Acute non-recurrent pansinusitis 08/27/2023   Allergy 2015   Itchy   Anemia 02/26/2023   Anxiety    Aortic atherosclerosis (HCC) 09/29/2020   On coronary CT    Arthritis    Atypical chest pain 11/11/2016   Cancer (HCC) 03/2022   Basal carcinoma mohs 04/18/22   Cardiac murmur 01/19/2020   Cataract 2023   Removed both eyes   Cervical radiculopathy 02/13/2020   Chest discomfort 01/19/2020   Coronary artery calcification 10/07/2020   COVID-19 06/2019   Current moderate episode of major depressive disorder without prior episode (HCC) 07/06/2020   Dermatitis 11/01/2020   Encounter for pre-operative cardiovascular clearance 07/18/2022   Essential hypertension 11/11/2016   GERD (gastroesophageal reflux disease) 11/11/2016   Hyperlipemia    Hyperlipidemia 11/11/2016   Hypertension    Numbness 01/02/2020   Other spondylosis with myelopathy, cervical region 01/19/2020   Palpitations 11/11/2016   Ringing in the ears    post covid symptom   Rotator cuff tear 10/28/2021   Past Surgical History:  Procedure Laterality Date   APPENDECTOMY     BICEPT TENODESIS Left 10/28/2021   Procedure: BICEPS TENODESIS;  Surgeon: Signa Kell, MD;  Location: [facility] OR[facility];  Service: Orthopedics;  Laterality: Left;   BUNIONECTOMY     bilat   CERVICAL SPINE SURGERY     CESAREAN SECTION     in toe   COLONOSCOPY  03/10/2016   Dr.Stark   JOINT REPLACEMENT  2023   Left shoulder   MOUTH SURGERY  2019   implant with crown  POLYPECTOMY     REVERSE SHOULDER ARTHROPLASTY Left 10/28/2021   Procedure: Left reverse shoulder arthroplasty, biceps tenodesis;  Surgeon: Signa Kell, MD;  Location: [facility];  Service: Orthopedics;  Laterality: Left;   SHOULDER ARTHROSCOPY WITH ROTATOR CUFF REPAIR AND SUBACROMIAL DECOMPRESSION Left 07/01/2021   Procedure: Left shoulder arthroscopic subscapularis repair, mini-open supraspinatus repair, subacromial decompression, and bicepst  tenodesis;  Surgeon: Signa Kell, MD;  Location: [facility];  Service: Orthopedics;  Laterality: Left;   SPINE SURGERY  2015   TONSILLECTOMY     TUBAL LIGATION     UPPER GASTROINTESTINAL ENDOSCOPY     WISDOM TOOTH EXTRACTION      Allergies  Allergies  Allergen Reactions   Codeine Itching   Tramadol Itching    Home Medications    Prior to Admission medications   Medication Sig Start Date End Date Taking? Authorizing Provider  ALPRAZolam Prudy Feeler) 0.5 MG tablet TAKE ONE TABLET BY MOUTH NO MORE THAN TWICE A DAY FOR SEVERE ANXIETY Patient not taking: Reported on 09/06/2023 06/19/23   Tollie Eth, NP  aspirin EC 81 MG tablet Take 81 mg by mouth daily. Swallow whole.    [provider]  diclofenac Sodium (VOLTAREN) 1 % GEL Apply 1 application  topically 4 (four) times daily as needed (pain). Patient not taking: Reported on 09/06/2023    [provider]  Docusate Sodium (DSS) 100 MG CAPS Take 1 capsule by mouth daily. 08/04/22   [provider]  ezetimibe (ZETIA) 10 MG tablet Take 1 tablet (10 mg total) by mouth daily. 06/04/23 05/29/24  Chilton Si, MD  hydrochlorothiazide (HYDRODIURIL) 25 MG tablet Take 1 tablet (25 mg total) by mouth daily. 10/06/22 10/01/23  Alver Sorrow, NP  metoprolol succinate (TOPROL-XL) 25 MG 24 hr tablet Take 1 tablet (25 mg total) by mouth daily. 07/26/23   Chilton Si, MD  olmesartan (BENICAR) 5 MG tablet Take 1 tablet (5 mg total) by mouth daily. 06/19/23   Tollie Eth, NP  omeprazole (PRILOSEC) 40 MG capsule Take 1 capsule (40 mg total) by mouth daily. 09/13/22   Meryl Dare, MD  potassium chloride (KLOR-CON M) 10 MEQ tablet Take 1 tablet (10 mEq total) by mouth daily. 01/29/23 01/24/24  Chilton Si, MD  rosuvastatin (CRESTOR) 20 MG tablet Take 1 tablet (20 mg total) by mouth daily. 02/22/23   Chilton Si, MD    Physical Exam    Vital Signs:  Seraya Jobst does not have vital signs available for  review today.  Given telephonic nature of communication, physical exam is limited. AAOx3. NAD. Normal affect.  Speech and respirations are unlabored.  Accessory Clinical Findings    None  Assessment & Plan    1.  Preoperative Cardiovascular Risk Assessment: According to the Revised Cardiac Risk Index (RCRI), her Perioperative Risk of Major Cardiac Event is (%): 0.9  Her Functional Capacity in METs is: 8.97 according to the [facility] Activity Status Index (DASI).   The patient was advised that if she develops new symptoms prior to surgery to contact our office to arrange for a follow-up visit, and she verbalized understanding.  Per office protocol, if patient is without any new symptoms or concerns at the time of their virtual visit, he/she may hold ASA for 7 days prior to procedure. Please resume ASA as soon as possible postprocedure, at the discretion of the surgeon.    A copy of this note will be routed to requesting surgeon.   Time:   Today,  I have spent 10 minutes with the patient with telehealth technology discussing medical history, symptoms, and management plan.     Joni Reining, NP  09/13/2023, 11:23 AM

## 2023-09-13 ENCOUNTER — Ambulatory Visit: Payer: Medicare Other | Attending: Internal Medicine

## 2023-09-13 ENCOUNTER — Encounter: Payer: Self-pay | Admitting: Nurse Practitioner

## 2023-09-13 DIAGNOSIS — Z01810 Encounter for preprocedural cardiovascular examination: Secondary | ICD-10-CM | POA: Diagnosis not present

## 2023-09-13 DIAGNOSIS — Z01818 Encounter for other preprocedural examination: Secondary | ICD-10-CM

## 2023-09-14 NOTE — Addendum Note (Signed)
Addended by: Jodelle Gross on: 09/14/2023 10:15 AM   Modules accepted: Level of Service

## 2023-09-17 ENCOUNTER — Telehealth: Payer: Self-pay | Admitting: Cardiovascular Disease

## 2023-09-17 ENCOUNTER — Encounter: Payer: BC Managed Care – PPO | Admitting: Nurse Practitioner

## 2023-09-17 NOTE — Telephone Encounter (Signed)
 Patient called and made mention that Dr. Linna Caprice from [facility] says that they have not received clearance from our office so that she can get cleared to have her surgery. Please refax clearance form ASAP to [phone].

## 2023-09-17 NOTE — Telephone Encounter (Signed)
 Pre-op note routed/faxed to mentioned fax number given.

## 2023-09-19 ENCOUNTER — Telehealth: Payer: Self-pay | Admitting: Nurse Practitioner

## 2023-09-19 DIAGNOSIS — Z029 Encounter for administrative examinations, unspecified: Secondary | ICD-10-CM | POA: Diagnosis not present

## 2023-09-19 NOTE — Telephone Encounter (Signed)
 Reason for CRM: Incorrect form was given to the patient. Form stated that patient would be having her surgery at the hospital but the patient will be having her surgery done at the surgical center in [place]. A new form will faxed over.  Form has been received and placed in SB folder.

## 2023-09-26 ENCOUNTER — Encounter: Payer: BC Managed Care – PPO | Admitting: Nurse Practitioner

## 2023-10-18 ENCOUNTER — Other Ambulatory Visit: Payer: Self-pay

## 2023-10-18 MED ORDER — OMEPRAZOLE 40 MG PO CPDR: 40.0000 mg | DELAYED_RELEASE_CAPSULE | Freq: Every day | ORAL | 3 refills | Status: AC

## 2023-10-18 NOTE — Progress Notes (Signed)
 [facility] refill request received by fax for Omeprazole 40mg , sig: 1 capsule daily, #90.  Refill sent electronically, patient was seen 09/06/2023.

## 2023-11-19 ENCOUNTER — Other Ambulatory Visit: Payer: Self-pay | Admitting: Nurse Practitioner

## 2023-11-19 DIAGNOSIS — Z1231 Encounter for screening mammogram for malignant neoplasm of breast: Secondary | ICD-10-CM

## 2023-11-27 ENCOUNTER — Other Ambulatory Visit: Payer: Self-pay | Admitting: Nurse Practitioner

## 2023-11-27 DIAGNOSIS — F321 Major depressive disorder, single episode, moderate: Secondary | ICD-10-CM

## 2023-11-27 DIAGNOSIS — F419 Anxiety disorder, unspecified: Secondary | ICD-10-CM

## 2023-11-27 NOTE — Telephone Encounter (Signed)
 Last apt 09/04/23

## 2023-11-28 ENCOUNTER — Telehealth: Payer: Self-pay | Admitting: Nurse Practitioner

## 2023-11-28 MED ORDER — OLMESARTAN MEDOXOMIL 5 MG PO TABS: 5.0000 mg | ORAL_TABLET | Freq: Every day | ORAL | 1 refills | Status: DC

## 2023-11-28 NOTE — Telephone Encounter (Signed)
 Copied from CRM [id number]. Topic: Clinical - Medication Refill >> Nov 28, 2023 10:27 AM Donald Frost wrote: Medication: olmesartan  (BENICAR ) 5 MG tablet  Has the patient contacted their pharmacy? Yes   This is the patient's preferred pharmacy:  [facility] [address] [facility] - [place], [place] - [address] [facility] [address] [facility] [address] [place] [address] Phone: [phone] Fax: [phone]  Is this the correct pharmacy for this prescription? Yes If no, delete pharmacy and type the correct one.   Has the prescription been filled recently? No  Is the patient out of the medication? No but she only has a few pills left  Has the patient been seen for an appointment in the last year OR does the patient have an upcoming appointment? Yes  Can we respond through MyChart? Yes  Please assist patient further

## 2023-12-02 ENCOUNTER — Other Ambulatory Visit (HOSPITAL_BASED_OUTPATIENT_CLINIC_OR_DEPARTMENT_OTHER): Payer: Self-pay | Admitting: Family

## 2023-12-02 DIAGNOSIS — I10 Essential (primary) hypertension: Secondary | ICD-10-CM

## 2023-12-03 ENCOUNTER — Other Ambulatory Visit (HOSPITAL_BASED_OUTPATIENT_CLINIC_OR_DEPARTMENT_OTHER): Payer: Self-pay

## 2023-12-03 DIAGNOSIS — I10 Essential (primary) hypertension: Secondary | ICD-10-CM

## 2023-12-03 MED ORDER — HYDROCHLOROTHIAZIDE 25 MG PO TABS: 25.0000 mg | ORAL_TABLET | Freq: Every day | ORAL | 3 refills | Status: DC

## 2023-12-03 NOTE — Telephone Encounter (Signed)
 Rx refilled.

## 2024-01-02 ENCOUNTER — Ambulatory Visit

## 2024-01-03 ENCOUNTER — Ambulatory Visit
Admission: RE | Admit: 2024-01-03 | Discharge: 2024-01-03 | Disposition: A | Discharge status: Home / Self Care | Source: Ambulatory Visit | Attending: Nurse Practitioner | Admitting: Nurse Practitioner

## 2024-01-03 DIAGNOSIS — Z1231 Encounter for screening mammogram for malignant neoplasm of breast: Secondary | ICD-10-CM

## 2024-01-07 ENCOUNTER — Ambulatory Visit: Payer: Self-pay | Admitting: Nurse Practitioner

## 2024-01-21 ENCOUNTER — Other Ambulatory Visit: Payer: Self-pay | Admitting: *Deleted

## 2024-01-21 MED ORDER — METOPROLOL SUCCINATE ER 25 MG PO TB24: 25.0000 mg | ORAL_TABLET | Freq: Every day | ORAL | 0 refills | Status: DC

## 2024-01-24 ENCOUNTER — Other Ambulatory Visit: Payer: Self-pay

## 2024-01-24 MED ORDER — POTASSIUM CHLORIDE CRYS ER 10 MEQ PO TBCR: 10.0000 meq | EXTENDED_RELEASE_TABLET | Freq: Every day | ORAL | 0 refills | Status: DC

## 2024-02-01 ENCOUNTER — Other Ambulatory Visit: Payer: Self-pay | Admitting: Nurse Practitioner

## 2024-02-01 DIAGNOSIS — F419 Anxiety disorder, unspecified: Secondary | ICD-10-CM

## 2024-02-01 DIAGNOSIS — F321 Major depressive disorder, single episode, moderate: Secondary | ICD-10-CM

## 2024-02-17 ENCOUNTER — Other Ambulatory Visit: Payer: Self-pay

## 2024-02-17 ENCOUNTER — Emergency Department (HOSPITAL_COMMUNITY)

## 2024-02-17 ENCOUNTER — Emergency Department (HOSPITAL_COMMUNITY)
Admission: EM | Admit: 2024-02-17 | Discharge: 2024-02-17 | Disposition: A | Discharge status: Home / Self Care | Attending: Emergency Medicine | Admitting: Emergency Medicine

## 2024-02-17 DIAGNOSIS — Z7982 Long term (current) use of aspirin: Secondary | ICD-10-CM | POA: Diagnosis not present

## 2024-02-17 DIAGNOSIS — S060X1A Concussion with loss of consciousness of 30 minutes or less, initial encounter: Secondary | ICD-10-CM | POA: Diagnosis not present

## 2024-02-17 DIAGNOSIS — S0285XA Fracture of orbit, unspecified, initial encounter for closed fracture: Secondary | ICD-10-CM | POA: Diagnosis not present

## 2024-02-17 DIAGNOSIS — S022XXA Fracture of nasal bones, initial encounter for closed fracture: Secondary | ICD-10-CM | POA: Diagnosis not present

## 2024-02-17 DIAGNOSIS — W01198A Fall on same level from slipping, tripping and stumbling with subsequent striking against other object, initial encounter: Secondary | ICD-10-CM | POA: Insufficient documentation

## 2024-02-17 DIAGNOSIS — Z79899 Other long term (current) drug therapy: Secondary | ICD-10-CM | POA: Insufficient documentation

## 2024-02-17 DIAGNOSIS — S0992XA Unspecified injury of nose, initial encounter: Secondary | ICD-10-CM | POA: Diagnosis present

## 2024-02-17 DIAGNOSIS — I10 Essential (primary) hypertension: Secondary | ICD-10-CM | POA: Insufficient documentation

## 2024-02-17 LAB — CBC WITH DIFFERENTIAL/PLATELET
Abs Immature Granulocytes: 0.02 K/uL (ref 0.00–0.07)
Basophils Absolute: 0 K/uL (ref 0.0–0.1)
Basophils Relative: 0 %
Eosinophils Absolute: 0.1 K/uL (ref 0.0–0.5)
Eosinophils Relative: 1 %
HCT: 35.1 % — ABNORMAL LOW (ref 36.0–46.0)
Hemoglobin: 11.2 g/dL — ABNORMAL LOW (ref 12.0–15.0)
Immature Granulocytes: 0 %
Lymphocytes Relative: 23 %
Lymphs Abs: 1.6 K/uL (ref 0.7–4.0)
MCH: 29.6 pg (ref 26.0–34.0)
MCHC: 31.9 g/dL (ref 30.0–36.0)
MCV: 92.9 fL (ref 80.0–100.0)
Monocytes Absolute: 0.6 K/uL (ref 0.1–1.0)
Monocytes Relative: 9 %
Neutro Abs: 4.5 K/uL (ref 1.7–7.7)
Neutrophils Relative %: 67 %
Platelets: 215 K/uL (ref 150–400)
RBC: 3.78 MIL/uL — ABNORMAL LOW (ref 3.87–5.11)
RDW: 14.6 % (ref 11.5–15.5)
WBC: 6.9 K/uL (ref 4.0–10.5)
nRBC: 0 % (ref 0.0–0.2)

## 2024-02-17 LAB — COMPREHENSIVE METABOLIC PANEL WITH GFR
ALT: 51 U/L — ABNORMAL HIGH (ref 0–44)
AST: 67 U/L — ABNORMAL HIGH (ref 15–41)
Albumin: 3.9 g/dL (ref 3.5–5.0)
Alkaline Phosphatase: 84 U/L (ref 38–126)
Anion gap: 11 (ref 5–15)
BUN: 29 mg/dL — ABNORMAL HIGH (ref 8–23)
CO2: 26 mmol/L (ref 22–32)
Calcium: 9.8 mg/dL (ref 8.9–10.3)
Chloride: 102 mmol/L (ref 98–111)
Creatinine, Ser: 0.9 mg/dL (ref 0.44–1.00)
GFR, Estimated: >60 mL/min (ref >60)
Glucose, Bld: 80 mg/dL (ref 70–99)
Potassium: 4.2 mmol/L (ref 3.5–5.1)
Sodium: 139 mmol/L (ref 135–145)
Total Bilirubin: 0.8 mg/dL (ref 0.0–1.2)
Total Protein: 7.7 g/dL (ref 6.5–8.1)

## 2024-02-17 MED ORDER — KETOROLAC TROMETHAMINE 15 MG/ML IJ SOLN
15.0000 mg | Freq: Once | INTRAMUSCULAR | Status: AC
  Administered 2024-02-17: 15 mg via INTRAVENOUS
  Filled 2024-02-17: qty 1

## 2024-02-17 MED ORDER — METOCLOPRAMIDE HCL 5 MG/ML IJ SOLN
10.0000 mg | Freq: Once | INTRAMUSCULAR | Status: AC
  Administered 2024-02-17: 10 mg via INTRAVENOUS
  Filled 2024-02-17: qty 2

## 2024-02-17 NOTE — Discharge Instructions (Signed)
 If you start having any issues with your vision such as double vision, feeling off balance with walking you should follow-up with the ENT about the orbital fracture.  There is no sign of internal bleeding of the brain or broken bones in the neck or the ribs.  However you do have a concussion so you need to stay hydrated, rest and take it easy.  It is fine to take Tylenol  or nausea medicine as needed.  Follow-up with the concussion clinic if this is not improving.

## 2024-02-17 NOTE — ED Triage Notes (Signed)
 Pt reports having a fall 1 week ago where she hit her left side of her face. Bruising noted to left side of her face. Pt denies any LOC. Pt reports clear drainage coming from nostrils. Pt does not feel like herself.

## 2024-02-17 NOTE — ED Provider Notes (Signed)
 [facility] AT [facility] Provider Note   CSN: [id number] Arrival date & time: 02/17/24  1405     Patient presents with: Fall and Head Injury   Caitlin Rogers is a 68 y.o. female.   Patient is a 68 year old female with a history of hypertension, hyperlipidemia, anemia, ongoing issues with joint replacements and now suffering with back pain where she will intermittently get numbness that goes down 1 leg reports 1 week ago she was in the bathroom and fell.  She thinks she was knocked out because she cannot remember the fall but reports she has had significant swelling, pain and bruising on the left side of her face.  For the last week she has had persistent nausea, headaches, feeling foggy, some mild vision change in her left eye where it just seems blurry sometimes, as well as drainage from her nose that has not been bloody.  She continues to have back pain but feels that it is unchanged from her baseline.  Also having neck soreness and pain but also suffers with that regularly.  She takes a baby aspirin  but no other anticoagulants.  Also she reports since the fall she has been having pain in the left rib area that is worse when she twists to get out of bed or takes a deep breath.  No shortness of breath.  The history is provided by the patient and medical records.  Fall  Head Injury      Prior to Admission medications   Medication Sig Start Date End Date Taking? Authorizing Provider  ALPRAZolam  (XANAX ) 0.5 MG tablet TAKE ONE TABLET BY MOUTH NO MORE THAN TWICE A DAY FOR SEVERE ANXIETY 02/05/24   Tysinger, Alm RAMAN, PA-C  aspirin  EC 81 MG tablet Take 81 mg by mouth daily. Swallow whole.    [provider]  diclofenac Sodium (VOLTAREN) 1 % GEL Apply 1 application  topically 4 (four) times daily as needed (pain). Patient not taking: Reported on 09/06/2023    [provider]  Docusate Sodium  (DSS) 100 MG CAPS Take 1 capsule by mouth daily.  08/04/22   [provider]  ezetimibe  (ZETIA ) 10 MG tablet Take 1 tablet (10 mg total) by mouth daily. 06/04/23 05/29/24  Raford Riggs, MD  hydrochlorothiazide  (HYDRODIURIL ) 25 MG tablet Take 1 tablet (25 mg total) by mouth daily. 12/03/23 11/27/24  Raford Riggs, MD  metoprolol  succinate (TOPROL -XL) 25 MG 24 hr tablet Take 1 tablet (25 mg total) by mouth daily. 01/21/24   Raford Riggs, MD  olmesartan  (BENICAR ) 5 MG tablet Take 1 tablet (5 mg total) by mouth daily. 11/28/23   Early, Sara E, NP  omeprazole  (PRILOSEC) 40 MG capsule Take 1 capsule (40 mg total) by mouth daily. 10/18/23   Suzann Inocente HERO, MD  potassium chloride  (KLOR-CON  M) 10 MEQ tablet Take 1 tablet (10 mEq total) by mouth daily. 01/24/24 01/18/25  Raford Riggs, MD  rosuvastatin  (CRESTOR ) 20 MG tablet Take 1 tablet (20 mg total) by mouth daily. 02/22/23   Raford Riggs, MD    Allergies: Codeine and Tramadol     Review of Systems  Updated Vital Signs BP (!) 121/51 (BP Location: Left Arm)   Pulse 78   Temp 98.7 F (37.1 C) (Oral)   Resp 20   Ht 5' 2 (1.575 m)   Wt 75.3 kg   SpO2 100%   BMI 30.36 kg/m   Physical Exam Vitals and nursing note reviewed.  Constitutional:      General:  She is not in acute distress.    Appearance: She is well-developed.  HENT:     Head: Normocephalic.   Eyes:     Extraocular Movements: Extraocular movements intact.     Pupils: Pupils are equal, round, and reactive to light.  Cardiovascular:     Rate and Rhythm: Normal rate and regular rhythm.     Pulses: Normal pulses.     Heart sounds: Normal heart sounds. No murmur heard.    No friction rub.  Pulmonary:     Effort: Pulmonary effort is normal.     Breath sounds: Normal breath sounds. No wheezing or rales.  Chest:     Chest wall: Tenderness present.  Abdominal:     General: Bowel sounds are normal. There is no distension.     Palpations: Abdomen is soft.     Tenderness: There is no abdominal tenderness. There  is no guarding or rebound.  Musculoskeletal:        General: Normal range of motion.     Cervical back: Normal range of motion and neck supple. Tenderness present.     Comments: No edema  Skin:    General: Skin is warm and dry.     Findings: No rash.  Neurological:     Mental Status: She is alert and oriented to person, place, and time. Mental status is at baseline.     Cranial Nerves: No cranial nerve deficit.     Sensory: Sensation is intact.     Motor: Motor function is intact. No pronator drift.     Gait: Gait is intact.  Psychiatric:        Behavior: Behavior normal.     (all labs ordered are listed, but only abnormal results are displayed) Labs Reviewed  CBC WITH DIFFERENTIAL/PLATELET - Abnormal; Notable for the following components:      Result Value   RBC 3.78 (*)    Hemoglobin 11.2 (*)    HCT 35.1 (*)    All other components within normal limits  COMPREHENSIVE METABOLIC PANEL WITH GFR - Abnormal; Notable for the following components:   BUN 29 (*)    AST 67 (*)    ALT 51 (*)    All other components within normal limits  URINALYSIS, ROUTINE W REFLEX MICROSCOPIC    EKG: None  Radiology: DG Ribs Unilateral W/Chest Left Result Date: 02/17/2024 CLINICAL DATA:  Fall with rib pain EXAM: LEFT RIBS AND CHEST - 3+ VIEW COMPARISON:  chest x-ray 09/29/2020 FINDINGS: No fracture or other bone lesions are seen involving the ribs. Left shoulder arthroplasty in cervical spinal fusion plate are present. There is no evidence of pneumothorax or pleural effusion. Both lungs are clear. Heart size and mediastinal contours are within normal limits. IMPRESSION: Negative. Electronically Signed   By: Greig Pique M.D.   On: 02/17/2024 16:44   CT Cervical Spine Wo Contrast Result Date: 02/17/2024 CLINICAL DATA:  Fall 1 week ago. EXAM: CT CERVICAL SPINE WITHOUT CONTRAST TECHNIQUE: Multidetector CT imaging of the cervical spine was performed without intravenous contrast. Multiplanar CT image  reconstructions were also generated. RADIATION DOSE REDUCTION: This exam was performed according to the departmental dose-optimization program which includes automated exposure control, adjustment of the mA and/or kV according to patient size and/or use of iterative reconstruction technique. COMPARISON:  None Available. FINDINGS: Alignment: Normal. Skull base and vertebrae: No acute fracture. No primary bone lesion or focal pathologic process. Soft tissues and spinal canal: No prevertebral fluid or swelling. No visible canal  hematoma. Disc levels: Prior ACDF C4-C7 with solid bony fusion across the disc spaces. No hardware complicating feature. Degenerative disc disease at C7-T1 with disc space narrowing and spurring. Mild to moderate bilateral degenerative facet disease. Upper chest: Biapical scarring.  No acute findings. Other: None IMPRESSION: Postoperative and degenerative changes in the cervical spine. No acute bony abnormality. Electronically Signed   By: Franky Crease M.D.   On: 02/17/2024 16:24   CT Head Wo Contrast Result Date: 02/17/2024 CLINICAL DATA:  Head trauma, moderate-severe.  Fall 1 week ago. EXAM: CT HEAD WITHOUT CONTRAST TECHNIQUE: Contiguous axial images were obtained from the base of the skull through the vertex without intravenous contrast. RADIATION DOSE REDUCTION: This exam was performed according to the departmental dose-optimization program which includes automated exposure control, adjustment of the mA and/or kV according to patient size and/or use of iterative reconstruction technique. COMPARISON:  None Available. FINDINGS: Brain: No acute intracranial abnormality. Specifically, no hemorrhage, hydrocephalus, mass lesion, acute infarction, or significant intracranial injury. Vascular: No hyperdense vessel or unexpected calcification. Skull: No acute calvarial abnormality. Sinuses/Orbits: See face CT report Other: None IMPRESSION: No acute intracranial abnormality. Electronically Signed    By: Franky Crease M.D.   On: 02/17/2024 16:23   CT Maxillofacial Wo Contrast Result Date: 02/17/2024 CLINICAL DATA:  Facial trauma, blunt. Fall 1 week ago, hit left side of face. Bruising noted to left face. EXAM: CT MAXILLOFACIAL WITHOUT CONTRAST TECHNIQUE: Multidetector CT imaging of the maxillofacial structures was performed. Multiplanar CT image reconstructions were also generated. RADIATION DOSE REDUCTION: This exam was performed according to the departmental dose-optimization program which includes automated exposure control, adjustment of the mA and/or kV according to patient size and/or use of iterative reconstruction technique. COMPARISON:  None Available. FINDINGS: Osseous: Fracture noted through the left nasal bones at the nasal maxillary suture. Nasal septum midline. No additional facial fracture. Zygomatic arches and mandible intact. Orbits: There is a fracture through the floor of the left orbit with mildly depressed fracture fragment into the superior left maxillary sinus. Herniated orbital fat noted. No evidence of muscular entrapment. Orbital emphysema noted. Sinuses: No air-fluid levels. Soft tissues: Negative Limited intracranial: See head CT report IMPRESSION: Left nasal bone fracture. Depressed fracture through the floor of the left orbit with herniated orbital fat. No muscular entrapment. Electronically Signed   By: Franky Crease M.D.   On: 02/17/2024 16:23     Procedures   Medications Ordered in the ED  metoCLOPramide  (REGLAN ) injection 10 mg (10 mg Intravenous Given 02/17/24 1639)  ketorolac  (TORADOL ) 15 MG/ML injection 15 mg (15 mg Intravenous Given 02/17/24 1639)                                    Medical Decision Making Amount and/or Complexity of Data Reviewed Labs: ordered. Decision-making details documented in ED Course. Radiology: ordered and independent interpretation performed. Decision-making details documented in ED Course.  Risk Prescription drug  management.   Pt with multiple medical problems and comorbidities and presenting today with a complaint that caries a high risk for morbidity and mortality.  Here today after a fall 1 week ago with trauma noted to the left side of the face and concussive type symptoms.  Also having rib pain.  Low suspicion for acute cardiac cause causing the fall most likely related to her ongoing musculoskeletal issues.  Will ensure no evidence of facial fractures, cervical fractures or intracranial bleed.  Patient is mentating normally.  No focal findings on neuroexam.  5:09 PM I independently reviewed and interpreted patient's labs and CBC, CMP without acute changes. I have independently visualized and interpreted pt's images today.  Head CT without evidence of bleeding, chest x-ray with negative rib fracture.  Maxillofacial scan with nasal fracture.  Radiology reports depressed fracture through the floor of the left orbit with herniated fat but no muscular entrapment.  And cervical CT with postoperative and degenerative changes but no acute findings.  All this was discussed with the patient.  She was given concussion information and at this time appears stable for discharge home.  She was given follow-up as needed.  She and her husband are comfortable with this plan.      Final diagnoses:  Closed fracture of left orbit, initial encounter (HCC)  Closed fracture of nasal bone, initial encounter  Concussion with loss of consciousness of 30 minutes or less, initial encounter    ED Discharge Orders     None          Doretha Folks, MD 02/17/24 1710

## 2024-02-17 NOTE — ED Notes (Signed)
 Urine sample too short per Lab. Will recollect when pt able.

## 2024-02-18 ENCOUNTER — Ambulatory Visit: Payer: Self-pay

## 2024-02-18 DIAGNOSIS — S0291XA Unspecified fracture of skull, initial encounter for closed fracture: Secondary | ICD-10-CM

## 2024-02-18 NOTE — Telephone Encounter (Signed)
 FYI Only or Action Required?: FYI only for provider.  Patient was last seen in primary care on 09/04/2023 by Early, Camie BRAVO, NP.  Called Nurse Triage reporting No chief complaint on file..  Symptoms began several weeks ago.  Interventions attempted: Rest, hydration, or home remedies.  Symptoms are: unchanged .  Triage Disposition: See PCP Within 2 Weeks  Patient/caregiver understands and will follow disposition?: Yes   Copied from CRM [id number]. Topic: Clinical - Red Word Triage >> Feb 18, 2024 11:06 AM [name] wrote: Red Word that prompted transfer to Nurse Triage: Patient is sad, and crying. [name] 02/09/24 Left orbital bone broken, left side of face black,    patient  asking about labs that were done at the hospital. Patient is requesting to speak with a nurse. Reason for Disposition  [1] Fall AND [2] went to emergency department for evaluation or treatment  Answer Assessment - Initial Assessment Questions 1. MECHANISM: How did the fall happen?     Legs given out  2. DOMESTIC VIOLENCE AND ELDER ABUSE SCREENING: Did you fall because someone pushed you or tried to hurt you? If Yes, ask: Are you safe now?     Denies  3. ONSET: When did the fall happen? (e.g., minutes, hours, or days ago)     Couple of weeks ago  4. LOCATION: What part of the body hit the ground? (e.g., back, buttocks, head, hips, knees, hands, head, stomach)     Face  5. INJURY: Did you hurt (injure) yourself when you fell? If Yes, ask: What did you injure? Tell me more about this? (e.g., body area; type of injury; pain severity)    Fracture to the face  6. PAIN: Is there any pain? If Yes, ask: How bad is the pain? (e.g., Scale 0-10; or none, mild,       None  7. SIZE: For cuts, bruises, or swelling, ask: How large is it? (e.g., inches or centimeters)      Bruising on the face  8. PREGNANCY: Is there any chance you are pregnant? When was your last menstrual period?     No and  No  9. OTHER SYMPTOMS: Do you have any other symptoms? (e.g., dizziness, fever, weakness; new-onset or worsening).      Weakness  10. CAUSE: What do you think caused the fall (or falling)? (e.g., dizzy spell, tripped)       Weakness of the leg  Protocols used: Falls and [facility]

## 2024-02-18 NOTE — Telephone Encounter (Signed)
 Appt scheduled with Camie

## 2024-02-20 ENCOUNTER — Ambulatory Visit (INDEPENDENT_AMBULATORY_CARE_PROVIDER_SITE_OTHER): Admitting: Nurse Practitioner

## 2024-02-20 ENCOUNTER — Encounter: Payer: Self-pay | Admitting: Nurse Practitioner

## 2024-02-20 VITALS — BP 112/70 | HR 88 | Wt 170.0 lb

## 2024-02-20 DIAGNOSIS — R799 Abnormal finding of blood chemistry, unspecified: Secondary | ICD-10-CM

## 2024-02-20 DIAGNOSIS — F320 Major depressive disorder, single episode, mild: Secondary | ICD-10-CM

## 2024-02-20 DIAGNOSIS — F411 Generalized anxiety disorder: Secondary | ICD-10-CM | POA: Diagnosis not present

## 2024-02-20 DIAGNOSIS — E782 Mixed hyperlipidemia: Secondary | ICD-10-CM

## 2024-02-20 DIAGNOSIS — I10 Essential (primary) hypertension: Secondary | ICD-10-CM | POA: Diagnosis not present

## 2024-02-20 DIAGNOSIS — Z09 Encounter for follow-up examination after completed treatment for conditions other than malignant neoplasm: Secondary | ICD-10-CM | POA: Insufficient documentation

## 2024-02-20 DIAGNOSIS — R58 Hemorrhage, not elsewhere classified: Secondary | ICD-10-CM

## 2024-02-20 MED ORDER — ESCITALOPRAM OXALATE 5 MG PO TABS: 5.0000 mg | ORAL_TABLET | Freq: Every day | ORAL | 3 refills | Status: DC

## 2024-02-20 NOTE — Assessment & Plan Note (Signed)
See Depression

## 2024-02-20 NOTE — Assessment & Plan Note (Signed)
 Experiencing significant tearfulness and cognitive symptoms, particularly since the second hip operation. Reports crying frequently and feeling mentally unwell. Previously tried Wellbutrin  and sertraline  without significant improvement. Expresses desire to avoid medication dependency but is open to trying medication for current symptoms. We discussed the option to try lexapro  given it's faster onset of action. She is in agreement to this. She is open to speaking with a counselor/psychologist about her concerns. I will send a referral to psychology and psychiatry today to help with both counseling and potential medication management in the event Lexapro  is not effective.  - Start escitalopram  (Lexapro ) for depression and anxiety symptoms. - Refer to counseling services for additional support.

## 2024-02-20 NOTE — Progress Notes (Signed)
 Catheline Doing, DNP, AGNP-c [facility]  [address] [place], [place] [address] [phone]  ESTABLISHED PATIENT- Chronic Health and/or Follow-Up Visit  Blood pressure 112/70, pulse 88, weight 170 lb (77.1 kg).   History of Present Illness Caitlin Rogers is a 68 year old female who presents with facial bruising and pain following a fall.  Approximately two weeks ago, she experienced a significant fall in the [place] morning hours while in a different bathroom than usual, resulting in her hitting the vanity and subsequently the floor. She developed extensive facial bruising and has difficulty breathing through her nose. She was told that x-rays showed a fracture through the left nasal bone into the orbital floor. She is concerned about potential sagging of the skin.  She describes numbness in her toe and leg, which she associates with her sciatic nerve being affected by her spine's condition. Her medical history includes a hip replacement in April, followed by persistent pain. She completed a course of prednisone . An x-ray revealed scoliosis and arthritis in her lower back. She is currently attending physical therapy, but reports that she is not doing any heavy activities and is mostly sitting and flexing her muscles since the recent fall.  She has a history of high BUN, AST, and ALT levels, which she attributes to the fall and bruising. She is concerned about her mood, describing increased tearfulness and a lack of clarity in her thoughts since her second operation. She has a history of being on high doses of antidepressants, which caused adverse effects, and wants to avoid dependency on medications.  Her social history includes a focus on healthy eating, avoiding fried foods, and consuming a diet rich in vegetables. She has a family history of heart disease, with several siblings having had stents placed. She is currently on medications for blood pressure and cholesterol  management, including olmesartan  and hydrochlorothiazide , and is interested in potentially reducing her medication load.   All ROS negative with exception of what is listed above.   PHYSICAL EXAM Physical Exam Vitals and nursing note reviewed.  Constitutional:      Appearance: She is ill-appearing.  HENT:     Head: Contusion and left periorbital erythema present.     Jaw: Tenderness present.     Right Ear: Hearing normal.     Left Ear: Hearing normal.     Nose: Nasal tenderness and congestion present.  Eyes:     Extraocular Movements: Extraocular movements intact.  Cardiovascular:     Rate and Rhythm: Normal rate and regular rhythm.     Pulses: Normal pulses.     Heart sounds: Normal heart sounds.  Pulmonary:     Effort: Pulmonary effort is normal.     Breath sounds: Normal breath sounds.  Skin:    General: Skin is warm and dry.     Capillary Refill: Capillary refill takes less than 2 seconds.  Neurological:     Mental Status: She is alert.     Motor: Weakness present.  Psychiatric:        Attention and Perception: Attention and perception normal.        Mood and Affect: Mood is depressed. Affect is tearful.        Speech: Speech normal.        Behavior: Behavior is slowed. Behavior is cooperative.        Thought Content: Thought content normal.        Cognition and Memory: Cognition normal. She exhibits impaired recent memory.  Judgment: Judgment normal.      PLAN Problem List Items Addressed This Visit     Essential hypertension   Blood pressure is well-controlled at 112/70 mmHg. Currently on olmesartan  and hydrochlorothiazide . Interested in reducing medication burden if possible. We discussed the importance of remaining on medications that are providing benefit to her overall health. Her BP does have some wiggle room, therefore, we discussed that she may attempt to hold her hctz for the next 2-3 days and monitor her BP. Goal BP is 120/80 or less. If it remains  within goal, ok to continue to hold. If BP elevates, recommend restart HCTZ - Hold hydrochlorothiazide  for a few days and monitor blood pressure at home. If blood pressure remains stable around 120/80 mmHg, consider discontinuing hydrochlorothiazide .       Relevant Orders   CBC with Differential/Platelet   Comprehensive metabolic panel with GFR   Iron, TIBC and Ferritin Panel   Hyperlipidemia   Cholesterol levels are well-controlled with current medication. High cardiac calcium  score necessitates continued medication to manage cardiovascular risk. I do not recommend alteration to her lipid management at this time due to excellent control and increased risks.  - Continue current cholesterol medication regimen.      Relevant Orders   CBC with Differential/Platelet   Comprehensive metabolic panel with GFR   Iron, TIBC and Ferritin Panel   Depression, major, single episode, mild (HCC)   Experiencing significant tearfulness and cognitive symptoms, particularly since the second hip operation. Reports crying frequently and feeling mentally unwell. Previously tried Wellbutrin  and sertraline  without significant improvement. Expresses desire to avoid medication dependency but is open to trying medication for current symptoms. We discussed the option to try lexapro  given it's faster onset of action. She is in agreement to this. She is open to speaking with a counselor/psychologist about her concerns. I will send a referral to psychology and psychiatry today to help with both counseling and potential medication management in the event Lexapro  is not effective.  - Start escitalopram  (Lexapro ) for depression and anxiety symptoms. - Refer to counseling services for additional support.      Relevant Medications   escitalopram  (LEXAPRO ) 5 MG tablet   Other Relevant Orders   Ambulatory referral to Psychiatry   Ambulatory referral to Psychology   Hospital discharge follow-up - Primary   Sustained a fall  on February 10, 2024, resulting in a fracture of the left nasal bone and left orbital floor, with associated facial bruising and rib contusions. No rib fractures were identified on x-ray. Experiencing pain and bruising, but this is improving with time. Reports difficulty breathing through the nose and concerns about potential sinus involvement. We discussed the option of meeting with ENT for further evaluation in the event there were interventions that needed to be completed [place]. We also discussed meeting with the eye doctor to ensure no concerns with lense implants. Diagnosed with a concussion, experiencing cognitive symptoms and tearfulness, possibly related to post-concussion syndrome and underlying depression. I have encouraged her to reach out to the post concussion clinic to help gain a better understanding of the brain injury and what to expect. She is tearful in the office today. - Refer to ENT for evaluation of nasal and sinus issues. - Follow up with ophthalmology to assess vision changes and stability of cataract implants. - Consider referral to concussion clinic if cognitive symptoms persist. - Referral to psychiatry/psychology placed for assistance with depressive symptoms.       Generalized anxiety disorder  See Depression.      Relevant Medications   escitalopram  (LEXAPRO ) 5 MG tablet   Other Relevant Orders   Ambulatory referral to Psychiatry   Ambulatory referral to Psychology   Other Visit Diagnoses       Elevated BUN       Relevant Orders   CBC with Differential/Platelet   Comprehensive metabolic panel with GFR   Iron, TIBC and Ferritin Panel     Ecchymosis       Relevant Orders   CBC with Differential/Platelet   Comprehensive metabolic panel with GFR   Iron, TIBC and Ferritin Panel       Return for 2 weeks- lab only CMP, CBC, Fe; 6 week- mood virtual.  SaraBeth Corley Maffeo, DNP, AGNP-c Time: 48 minutes, >50% spent counseling, care coordination, chart review, and  documentation.

## 2024-02-20 NOTE — Assessment & Plan Note (Signed)
 Cholesterol levels are well-controlled with current medication. High cardiac calcium  score necessitates continued medication to manage cardiovascular risk. I do not recommend alteration to her lipid management at this time due to excellent control and increased risks.  - Continue current cholesterol medication regimen.

## 2024-02-20 NOTE — Assessment & Plan Note (Signed)
 Blood pressure is well-controlled at 112/70 mmHg. Currently on olmesartan  and hydrochlorothiazide . Interested in reducing medication burden if possible. We discussed the importance of remaining on medications that are providing benefit to her overall health. Her BP does have some wiggle room, therefore, we discussed that she may attempt to hold her hctz for the next 2-3 days and monitor her BP. Goal BP is 120/80 or less. If it remains within goal, ok to continue to hold. If BP elevates, recommend restart HCTZ - Hold hydrochlorothiazide  for a few days and monitor blood pressure at home. If blood pressure remains stable around 120/80 mmHg, consider discontinuing hydrochlorothiazide .

## 2024-02-20 NOTE — Patient Instructions (Signed)
 VISIT SUMMARY:  Today, we discussed your recent fall and the resulting injuries, including a fracture of your left nasal bone and orbital floor, facial bruising, and a concussion. We also addressed your ongoing issues with depression, back pain due to scoliosis and arthritis, and your well-controlled hypertension and cholesterol levels.  YOUR PLAN:  -FRACTURE OF LEFT NASAL BONE AND LEFT ORBITAL FLOOR WITH ASSOCIATED FACIAL AND RIB CONTUSIONS AND CONCUSSION: You have a fracture in your left nasal bone and orbital floor, which is causing facial bruising and pain. This injury also resulted in a concussion, which is affecting your mood and cognitive function. We will refer you to an ENT specialist to evaluate your nasal and sinus issues, and to an ophthalmologist to check your vision and cataract implants. If your cognitive symptoms persist, we may refer you to a concussion clinic.  -DEPRESSION WITH TEARFULNESS AND COGNITIVE SYMPTOMS: You are experiencing significant tearfulness and cognitive issues, likely related to your concussion and recent surgeries. We will start you on escitalopram  (Lexapro ) to help manage your depression and anxiety symptoms, and refer you to counseling services for additional support.  -SCOLIOSIS AND DEGENERATIVE ARTHRITIS OF LUMBAR SPINE: You have scoliosis and arthritis in your lower back, which is causing pain and affecting your sciatic nerve. Continue with physical therapy as tolerated to help manage these symptoms.  -HYPERTENSION: Your blood pressure is well-controlled with your current medications. We will hold your hydrochlorothiazide  for a few days and monitor your blood pressure at home. If it remains stable, we may consider discontinuing this medication.  -HYPERLIPIDEMIA WITH HIGH CARDIAC CALCIUM  SCORE: Your cholesterol levels are well-controlled with your current medication. Due to your high cardiac calcium  score, it is important to continue your current cholesterol  medication to manage your cardiovascular risk.  INSTRUCTIONS:  Please follow up with the ENT specialist and ophthalmologist as soon as possible. Monitor your blood pressure at home and keep a record of the readings. If your cognitive symptoms persist, we may refer you to a concussion clinic. Continue with physical therapy as tolerated and start taking escitalopram  (Lexapro ) as prescribed. We will also refer you to counseling services for additional support.

## 2024-02-20 NOTE — Assessment & Plan Note (Signed)
 Sustained a fall on February 10, 2024, resulting in a fracture of the left nasal bone and left orbital floor, with associated facial bruising and rib contusions. No rib fractures were identified on x-ray. Experiencing pain and bruising, but this is improving with time. Reports difficulty breathing through the nose and concerns about potential sinus involvement. We discussed the option of meeting with ENT for further evaluation in the event there were interventions that needed to be completed Caitlin Rogers. We also discussed meeting with the eye doctor to ensure no concerns with lense implants. Diagnosed with a concussion, experiencing cognitive symptoms and tearfulness, possibly related to post-concussion syndrome and underlying depression. I have encouraged her to reach out to the post concussion clinic to help gain a better understanding of the brain injury and what to expect. She is tearful in the office today. - Refer to ENT for evaluation of nasal and sinus issues. - Follow up with ophthalmology to assess vision changes and stability of cataract implants. - Consider referral to concussion clinic if cognitive symptoms persist. - Referral to psychiatry/psychology placed for assistance with depressive symptoms.

## 2024-02-22 NOTE — Telephone Encounter (Signed)
 I do not see anything mentioned about ENT referral from visit on 02/20/24  Copied from CRM #[id number]. Topic: General - Other >> Feb 22, 2024  3:34 PM Marissa P wrote: Reason for CRM: Patient calling to follow up on ENT referral, please advise on what's going on since she has not received a callback yet

## 2024-02-25 ENCOUNTER — Inpatient Hospital Stay: Admitting: Family Medicine

## 2024-02-25 NOTE — Addendum Note (Signed)
 Addended by: Chestine Belknap, CAMIE E on: 02/25/2024 06:04 PM   Modules accepted: Orders

## 2024-02-27 ENCOUNTER — Other Ambulatory Visit

## 2024-02-28 ENCOUNTER — Telehealth: Payer: Self-pay | Admitting: Cardiovascular Disease

## 2024-02-28 ENCOUNTER — Telehealth: Payer: Self-pay | Admitting: Nurse Practitioner

## 2024-02-28 ENCOUNTER — Other Ambulatory Visit

## 2024-02-28 ENCOUNTER — Other Ambulatory Visit: Payer: Self-pay | Admitting: Cardiovascular Disease

## 2024-02-28 ENCOUNTER — Ambulatory Visit (INDEPENDENT_AMBULATORY_CARE_PROVIDER_SITE_OTHER): Admitting: Family Medicine

## 2024-02-28 ENCOUNTER — Ambulatory Visit
Admission: RE | Admit: 2024-02-28 | Discharge: 2024-02-28 | Disposition: A | Discharge status: Home / Self Care | Source: Ambulatory Visit | Attending: Family Medicine

## 2024-02-28 ENCOUNTER — Encounter: Payer: Self-pay | Admitting: Family Medicine

## 2024-02-28 VITALS — BP 122/72 | HR 72 | Wt 174.8 lb

## 2024-02-28 DIAGNOSIS — R58 Hemorrhage, not elsewhere classified: Secondary | ICD-10-CM

## 2024-02-28 DIAGNOSIS — R799 Abnormal finding of blood chemistry, unspecified: Secondary | ICD-10-CM

## 2024-02-28 DIAGNOSIS — N898 Other specified noninflammatory disorders of vagina: Secondary | ICD-10-CM

## 2024-02-28 DIAGNOSIS — I10 Essential (primary) hypertension: Secondary | ICD-10-CM

## 2024-02-28 DIAGNOSIS — E782 Mixed hyperlipidemia: Secondary | ICD-10-CM

## 2024-02-28 DIAGNOSIS — N950 Postmenopausal bleeding: Secondary | ICD-10-CM | POA: Diagnosis not present

## 2024-02-28 MED ORDER — POTASSIUM CHLORIDE CRYS ER 10 MEQ PO TBCR: 10.0000 meq | EXTENDED_RELEASE_TABLET | Freq: Every day | ORAL | 0 refills | Status: DC

## 2024-02-28 MED ORDER — METOPROLOL SUCCINATE ER 25 MG PO TB24: 25.0000 mg | ORAL_TABLET | Freq: Every day | ORAL | 0 refills | Status: DC

## 2024-02-28 NOTE — Progress Notes (Signed)
   Name: Caitlin Rogers   Date of Visit: 02/28/24   Date of last visit with me: Visit date not found   CHIEF COMPLAINT:  Chief Complaint  Patient presents with   Acute Visit     Had a fall/ Brown discharge started after, Wants to be checked out not sure if this is due to fall. Was seen by [facility], she checked her out, discharge had not started yet, started about about 4 days, mostly spotting, brown and now red. No pain in abdomin.        HPI:  Discussed the use of AI scribe software for clinical note transcription with the patient, who gave verbal consent to proceed.  History of Present Illness   Caitlin Rogers is a 68 year old female who presents with postmenopausal spotting.  She experienced light spotting approximately three to four days ago, initially brown and then red upon self-examination with a Q-tip. She is about fifteen years postmenopausal, and the spotting is not constant, usually noticed when wiping or on her underwear. No blood in her urine, fevers, chills, or urinary symptoms. She denies any weight loss, itching in the vaginal area, or bloody stools.  Her past medical history includes a fall resulting in a concussion, melanoma on her nose, recent hip replacements in January and April of this year, scoliosis, and cervical spine fusion surgery. She experiences pain following her hip surgeries.  Family history includes an aunt with colon cancer and a mother who passed away at 65 without an autopsy, leaving the cause of death uncertain. She is not currently sexually active. Recent lab work showed slightly elevated ALT, AST, and BUN levels.         OBJECTIVE:       09/04/2023    9:49 AM  Depression screen PHQ 2/9  Decreased Interest 0  Down, Depressed, Hopeless 1  PHQ - 2 Score 1     BP Readings from Last 3 Encounters:  02/28/24 122/72  02/20/24 112/70  02/17/24 (!) 121/51    BP 122/72   Pulse 72   Wt 174 lb 12.8 oz (79.3 kg)   SpO2 96%   BMI 31.97 kg/m     Physical Exam          Physical Exam Constitutional:      Appearance: Normal appearance.  Cardiovascular:     Rate and Rhythm: Normal rate and regular rhythm.  Skin:    Capillary Refill: Capillary refill takes less than 2 seconds.  Neurological:     General: No focal deficit present.     Mental Status: She is alert and oriented to person, place, and time. Mental status is at baseline.      ASSESSMENT/PLAN:   Assessment & Plan Post-menopausal bleeding  Vaginal discharge    Assessment and Plan    Postmenopausal vaginal bleeding 68 year old woman with postmenopausal bleeding. Differential includes endometrial pathology, vaginitis, or atrophic changes. - Previous labs reviewed 1 month ago without any significant concern though noted BUN elevation.  - Order urine test and STD panel. - Provide self-swab for bacterial vaginosis. - Order pelvic ultrasound to assess endometrium. - Advise scheduling ultrasound with [facility]. - Review recent blood work for metabolic causes. - Discuss hormonal therapy if vaginitis confirmed and persists.         Caitlin Jean A. Vita MD [facility]

## 2024-02-28 NOTE — Telephone Encounter (Signed)
 Refills has been sent to the pharmacy.

## 2024-02-28 NOTE — Telephone Encounter (Signed)
 Copied from CRM [id number]. Topic: Referral - Status >> Feb 28, 2024  3:04 PM [name] wrote: Reason for CRM: patient inquiring about status of ENT referral. Referral is pending.     Patient was informed  time frame  for new referral requests is 3 to 5 business days.   Patient would like a call back regarding this referral. Please call patient when referral has been approved.  Phone [phone]

## 2024-02-28 NOTE — Telephone Encounter (Signed)
*  STAT* If patient is at the pharmacy, call can be transferred to refill team.   1. Which medications need to be refilled? (please list name of each medication and dose if known) potassium chloride  (KLOR-CON  M) 10 MEQ tablet   metoprolol  succinate (TOPROL -XL) 25 MG 24 hr tablet    2. Would you like to learn more about the convenience, safety, & potential cost savings by using the [facility]? No   3. Are you open to using the [facility] (Type [facility].  ). No   4. Which pharmacy/location (including street and city if local pharmacy) is medication to be sent to? [facility] [address] - [place], [place] - [address]   5. Do they need a 30 day or 90 day supply? 90 day  Pt has an appt scheduled on 10/20.

## 2024-02-29 LAB — CBC WITH DIFFERENTIAL/PLATELET
Basophils Absolute: 0 x10E3/uL (ref 0.0–0.2)
Basos: 1 %
EOS (ABSOLUTE): 0.1 x10E3/uL (ref 0.0–0.4)
Eos: 4 %
Hematocrit: 31.9 % — ABNORMAL LOW (ref 34.0–46.6)
Hemoglobin: 10.3 g/dL — ABNORMAL LOW (ref 11.1–15.9)
Immature Grans (Abs): 0 x10E3/uL (ref 0.0–0.1)
Immature Granulocytes: 0 %
Lymphocytes Absolute: 1.2 x10E3/uL (ref 0.7–3.1)
Lymphs: 47 %
MCH: 29.6 pg (ref 26.6–33.0)
MCHC: 32.3 g/dL (ref 31.5–35.7)
MCV: 92 fL (ref 79–97)
Monocytes Absolute: 0.3 x10E3/uL (ref 0.1–0.9)
Monocytes: 11 %
Neutrophils Absolute: 1 x10E3/uL — ABNORMAL LOW (ref 1.4–7.0)
Neutrophils: 37 %
Platelets: 173 x10E3/uL (ref 150–450)
RBC: 3.48 x10E6/uL — ABNORMAL LOW (ref 3.77–5.28)
RDW: 14 % (ref 11.7–15.4)
WBC: 2.6 x10E3/uL — ABNORMAL LOW (ref 3.4–10.8)

## 2024-02-29 LAB — COMPREHENSIVE METABOLIC PANEL WITH GFR
ALT: 28 IU/L (ref 0–32)
AST: 55 IU/L — AB (ref 0–40)
Albumin: 4 g/dL (ref 3.9–4.9)
Alkaline Phosphatase: 98 IU/L (ref 44–121)
BUN/Creatinine Ratio: 14 (ref 12–28)
BUN: 11 mg/dL (ref 8–27)
Bilirubin Total: <0.2 mg/dL (ref 0.0–1.2)
CO2: 23 mmol/L (ref 20–29)
Calcium: 9.3 mg/dL (ref 8.7–10.3)
Chloride: 103 mmol/L (ref 96–106)
Creatinine, Ser: 0.8 mg/dL (ref 0.57–1.00)
Globulin, Total: 2.3 g/dL (ref 1.5–4.5)
Glucose: 86 mg/dL (ref 70–99)
Potassium: 4.5 mmol/L (ref 3.5–5.2)
Sodium: 141 mmol/L (ref 134–144)
Total Protein: 6.3 g/dL (ref 6.0–8.5)
eGFR: 81 mL/min/1.73 (ref >59)

## 2024-02-29 LAB — IRON,TIBC AND FERRITIN PANEL
Ferritin: 28 ng/mL (ref 15–150)
Iron Saturation: 8 — AB (ref 15–55)
Iron: 26 ug/dL — AB (ref 27–139)
Total Iron Binding Capacity: 324 ug/dL (ref 250–450)
UIBC: 298 ug/dL (ref 118–369)

## 2024-02-29 NOTE — Telephone Encounter (Signed)
 Placed additional dx saying sinus problem. It would not let me specify left   Copied from CRM #[id number]. Topic: Referral - Question >> Feb 29, 2024  8:57 AM Carlatta H wrote: Reason for CRM: Patient called the  [facility] and they advised the patient that the referral needed to be corrected stating left sinus//

## 2024-03-02 LAB — NUSWAB VAGINITIS PLUS (VG+)
Candida albicans, NAA: NEGATIVE
Candida glabrata, NAA: NEGATIVE

## 2024-03-02 LAB — URINALYSIS, MICROSCOPIC ONLY
Bacteria, UA: NONE SEEN
Casts: NONE SEEN /LPF

## 2024-03-03 ENCOUNTER — Other Ambulatory Visit: Payer: Self-pay

## 2024-03-03 ENCOUNTER — Ambulatory Visit: Payer: Self-pay | Admitting: Nurse Practitioner

## 2024-03-03 ENCOUNTER — Ambulatory Visit: Payer: Self-pay | Admitting: Family Medicine

## 2024-03-03 ENCOUNTER — Telehealth: Payer: Self-pay

## 2024-03-03 DIAGNOSIS — D72819 Decreased white blood cell count, unspecified: Secondary | ICD-10-CM

## 2024-03-03 DIAGNOSIS — N952 Postmenopausal atrophic vaginitis: Secondary | ICD-10-CM

## 2024-03-03 DIAGNOSIS — D509 Iron deficiency anemia, unspecified: Secondary | ICD-10-CM

## 2024-03-03 DIAGNOSIS — E782 Mixed hyperlipidemia: Secondary | ICD-10-CM

## 2024-03-03 MED ORDER — ROSUVASTATIN CALCIUM 20 MG PO TABS: 20.0000 mg | ORAL_TABLET | Freq: Every day | ORAL | 0 refills | Status: DC

## 2024-03-03 NOTE — Telephone Encounter (Signed)
 Copied from CRM [id number]. Topic: Clinical - Lab/Test Results >> Mar 03, 2024  1:02 PM Graeme ORN wrote: Reason for CRM: Patient called. Returned Missed call for Lab results. Read note as written by provider - Dr Vita. Patient understood. Advised no provider note yet for other labs. Patient understood. Thank You  Patient is informed of result notes.

## 2024-03-12 ENCOUNTER — Other Ambulatory Visit

## 2024-03-12 MED ORDER — ESTRADIOL 0.1 MG/GM VA CREA
TOPICAL_CREAM | VAGINAL | 6 refills | Status: DC
Start: 2024-03-12 — End: 2024-04-24

## 2024-03-12 NOTE — Telephone Encounter (Signed)
 Will send in topical estrogen for her to try. If ineffective, will refer.

## 2024-03-13 ENCOUNTER — Other Ambulatory Visit

## 2024-03-13 DIAGNOSIS — M5416 Radiculopathy, lumbar region: Secondary | ICD-10-CM | POA: Insufficient documentation

## 2024-03-13 DIAGNOSIS — M47816 Spondylosis without myelopathy or radiculopathy, lumbar region: Secondary | ICD-10-CM | POA: Insufficient documentation

## 2024-03-14 ENCOUNTER — Telehealth: Payer: Self-pay | Admitting: *Deleted

## 2024-03-14 LAB — IRON,TIBC AND FERRITIN PANEL
Ferritin: 93 ng/mL (ref 15–150)
Iron Saturation: >95 — AB (ref 15–55)
Iron: 349 ug/dL — AB (ref 27–139)
Total Iron Binding Capacity: <366 ug/dL (ref 250–450)
UIBC: <17 ug/dL — AB (ref 118–369)

## 2024-03-14 LAB — CBC WITH DIFFERENTIAL/PLATELET
Basophils Absolute: 0 x10E3/uL (ref 0.0–0.2)
Basos: 1 %
EOS (ABSOLUTE): 0.1 x10E3/uL (ref 0.0–0.4)
Eos: 2 %
Hematocrit: 33.5 % — ABNORMAL LOW (ref 34.0–46.6)
Hemoglobin: 10.7 g/dL — ABNORMAL LOW (ref 11.1–15.9)
Immature Grans (Abs): 0 x10E3/uL (ref 0.0–0.1)
Immature Granulocytes: 0 %
Lymphocytes Absolute: 1.5 x10E3/uL (ref 0.7–3.1)
Lymphs: 28 %
MCH: 29 pg (ref 26.6–33.0)
MCHC: 31.9 g/dL (ref 31.5–35.7)
MCV: 91 fL (ref 79–97)
Monocytes Absolute: 0.5 x10E3/uL (ref 0.1–0.9)
Monocytes: 10 %
Neutrophils Absolute: 3.1 x10E3/uL (ref 1.4–7.0)
Neutrophils: 58 %
Platelets: 230 x10E3/uL (ref 150–450)
RBC: 3.69 x10E6/uL — ABNORMAL LOW (ref 3.77–5.28)
RDW: 14.9 % (ref 11.7–15.4)
WBC: 5.2 x10E3/uL (ref 3.4–10.8)

## 2024-03-14 NOTE — Telephone Encounter (Signed)
 Copied from CRM #[id number]. Topic: Clinical - Medical Advice >> Mar 14, 2024  2:26 PM Debby BROCKS wrote: Reason for CRM: Patient states she received her lab work and wanted to know if she should keep taking her iron medication or not  Callback: [phone]

## 2024-03-18 ENCOUNTER — Ambulatory Visit: Payer: Self-pay | Admitting: Nurse Practitioner

## 2024-03-18 ENCOUNTER — Other Ambulatory Visit: Payer: Self-pay

## 2024-03-18 DIAGNOSIS — D509 Iron deficiency anemia, unspecified: Secondary | ICD-10-CM

## 2024-03-27 ENCOUNTER — Telehealth: Admitting: Nurse Practitioner

## 2024-04-02 ENCOUNTER — Other Ambulatory Visit: Payer: Self-pay | Admitting: Nurse Practitioner

## 2024-04-17 ENCOUNTER — Other Ambulatory Visit

## 2024-04-17 ENCOUNTER — Ambulatory Visit: Payer: Self-pay

## 2024-04-17 ENCOUNTER — Encounter: Payer: Self-pay | Admitting: Nurse Practitioner

## 2024-04-17 NOTE — Telephone Encounter (Signed)
 FYI Only or Action Required?: Action required by provider: referral request.  Patient was last seen in primary care on 02/28/2024 by Vita Morrow, MD.  Called Nurse Triage reporting Hip Pain.  Symptoms began today.  Interventions attempted: Nothing.  Symptoms are: rapidly worsening.  Triage Disposition: Call PCP When Office is Open  Patient/caregiver understands and will follow disposition?: YesCopied from CRM [id number]. Topic: Clinical - Red Word Triage >> Apr 17, 2024  8:41 AM Roselie BROCKS wrote: [facility] that prompted transfer to Nurse Triage: Patient states she is in intense pain on her left hip and lower back. Can barely walk , keeps falling Reason for Disposition  [1] Caller requesting NON-URGENT health information AND [2] PCP's office is the best resource  Answer Assessment - Initial Assessment Questions 1. REASON FOR CALL: What is the main reason for your call? or How can I best help you? Pt calling after sending PCP a mychart message. MRI results are in my message. Pt is needing referral to neurosurgeon ASAP. Pt is not wanting to make appt at this time. Please advise  Protocols used: Information Only Call - No Triage-A-AH

## 2024-04-17 NOTE — Telephone Encounter (Signed)
 Mychart message was sent to [facility] to review

## 2024-04-18 ENCOUNTER — Other Ambulatory Visit: Payer: Self-pay

## 2024-04-18 ENCOUNTER — Other Ambulatory Visit

## 2024-04-18 DIAGNOSIS — M48061 Spinal stenosis, lumbar region without neurogenic claudication: Secondary | ICD-10-CM

## 2024-04-18 DIAGNOSIS — M25552 Pain in left hip: Secondary | ICD-10-CM

## 2024-04-18 DIAGNOSIS — R937 Abnormal findings on diagnostic imaging of other parts of musculoskeletal system: Secondary | ICD-10-CM

## 2024-04-18 DIAGNOSIS — M4316 Spondylolisthesis, lumbar region: Secondary | ICD-10-CM

## 2024-04-19 LAB — IRON,TIBC AND FERRITIN PANEL
Ferritin: 31 ng/mL (ref 15–150)
Iron Saturation: 20 % (ref 15–55)
Iron: 75 ug/dL (ref 27–139)
Total Iron Binding Capacity: 372 ug/dL (ref 250–450)
UIBC: 297 ug/dL (ref 118–369)

## 2024-04-19 LAB — CBC WITH DIFFERENTIAL/PLATELET
Basophils Absolute: 0 x10E3/uL (ref 0.0–0.2)
Basos: 1 %
EOS (ABSOLUTE): 0.1 x10E3/uL (ref 0.0–0.4)
Eos: 3 %
Hematocrit: 34.9 % (ref 34.0–46.6)
Hemoglobin: 11.3 g/dL (ref 11.1–15.9)
Immature Grans (Abs): 0 x10E3/uL (ref 0.0–0.1)
Immature Granulocytes: 0 %
Lymphocytes Absolute: 1.2 x10E3/uL (ref 0.7–3.1)
Lymphs: 35 %
MCH: 30 pg (ref 26.6–33.0)
MCHC: 32.4 g/dL (ref 31.5–35.7)
MCV: 93 fL (ref 79–97)
Monocytes Absolute: 0.4 x10E3/uL (ref 0.1–0.9)
Monocytes: 11 %
Neutrophils Absolute: 1.6 x10E3/uL (ref 1.4–7.0)
Neutrophils: 50 %
Platelets: 162 x10E3/uL (ref 150–450)
RBC: 3.77 x10E6/uL (ref 3.77–5.28)
RDW: 15.8 % — ABNORMAL HIGH (ref 11.7–15.4)
WBC: 3.3 x10E3/uL — ABNORMAL LOW (ref 3.4–10.8)

## 2024-04-21 ENCOUNTER — Other Ambulatory Visit: Payer: Self-pay | Admitting: Medical

## 2024-04-21 DIAGNOSIS — F419 Anxiety disorder, unspecified: Secondary | ICD-10-CM

## 2024-04-21 DIAGNOSIS — F321 Major depressive disorder, single episode, moderate: Secondary | ICD-10-CM

## 2024-04-22 NOTE — Progress Notes (Addendum)
 Referring Physician:  Oris Camie BRAVO, NP [address] [place],  [place] [address]  Primary Physician:  Oris Camie BRAVO, NP  History of Present Illness: 04/24/2024 [name] has a history of HTN, CAD, GERD, hyperlipidemia, anxiety, prediabetes, GAD.   She had left THA 10/2023 (she has bilateral THA) and since that time she has constant LBP. No leg pain, but she has intermittent numbness in left leg. She has constant weakness in entire left leg. She has fallen due to weakness. Pain is worse with walking and better with sitting.   She did PT for her back at [facility] for about 6 sessions.   She is taking diclofenac, robaxin/zanaflex (takes rarely and not at the same time).  Injections not recommended by PMR at [facility]) due to history of AVN (left shoulder and both hips) along with epidural lipomatosis. She was being referred to Dr. Burnetta for surgery.   No neck or arm pain. Having some balance issues, but left leg is weak. Not sure if having dexterity issues. History of cervical fusion as well.   Tobacco use: Does not smoke.   Bowel/Bladder Dysfunction: none  Conservative measures:  Physical therapy:  has participated with [facility] (6 sessions) and after recent MRI patient stopped going. Multimodal medical therapy including regular antiinflammatories: Tizanidine, Methocarbamol, Diclofenac. Injections: no epidural steroid injections  Past Surgery:  Cervical fusion in [place]  around 2015   Caitlin Rogers has no symptoms of cervical myelopathy.  The symptoms are causing a significant impact on the patient's life.   Review of Systems:  A 10 point review of systems is negative, except for the pertinent positives and negatives detailed in the HPI.  Past Medical History: Past Medical History:  Diagnosis Date   Acute non-recurrent pansinusitis 08/27/2023   Allergy 2015   Itchy   Anemia 02/26/2023   Anxiety    Aortic atherosclerosis 09/29/2020   On coronary  CT    Arthritis    Atypical chest pain 11/11/2016   Cancer (HCC) 03/2022   Basal carcinoma mohs 04/18/22   Cardiac murmur 01/19/2020   Cataract 2023   Removed both eyes   Cervical radiculopathy 02/13/2020   Chest discomfort 01/19/2020   Coronary artery calcification 10/07/2020   COVID-19 06/2019   Current moderate episode of major depressive disorder without prior episode (HCC) 07/06/2020   Dermatitis 11/01/2020   Encounter for pre-operative cardiovascular clearance 07/18/2022   Essential hypertension 11/11/2016   GERD (gastroesophageal reflux disease) 11/11/2016   Hyperlipemia    Hyperlipidemia 11/11/2016   Hypertension    Numbness 01/02/2020   Other spondylosis with myelopathy, cervical region 01/19/2020   Palpitations 11/11/2016   Ringing in the ears    post covid symptom   Rotator cuff tear 10/28/2021    Past Surgical History: Past Surgical History:  Procedure Laterality Date   APPENDECTOMY     BICEPT TENODESIS Left 10/28/2021   Procedure: BICEPS TENODESIS;  Surgeon: Tobie Priest, MD;  Location: [facility];  Service: Orthopedics;  Laterality: Left;   BUNIONECTOMY     bilat   CERVICAL SPINE SURGERY     CESAREAN SECTION     in toe   COLONOSCOPY  03/10/2016   Dr.Stark   JOINT REPLACEMENT  2023   Left shoulder   MOUTH SURGERY  2019   implant with crown   POLYPECTOMY     REVERSE SHOULDER ARTHROPLASTY Left 10/28/2021   Procedure: Left reverse shoulder arthroplasty, biceps tenodesis;  Surgeon: Tobie Priest, MD;  Location: [facility];  Service: Orthopedics;  Laterality: Left;   SHOULDER ARTHROSCOPY WITH ROTATOR CUFF REPAIR AND SUBACROMIAL DECOMPRESSION Left 07/01/2021   Procedure: Left shoulder arthroscopic subscapularis repair, mini-open supraspinatus repair, subacromial decompression, and bicepst tenodesis;  Surgeon: Tobie Priest, MD;  Location: [facility];  Service: Orthopedics;  Laterality: Left;   SPINE SURGERY  2015   TONSILLECTOMY     TUBAL LIGATION      UPPER GASTROINTESTINAL ENDOSCOPY     WISDOM TOOTH EXTRACTION      Allergies: Allergies as of 04/24/2024 - Review Complete 04/24/2024  Allergen Reaction Noted   Codeine Itching 04/05/2020   Tramadol  Itching 05/23/2021    Medications: Outpatient Encounter Medications as of 04/24/2024  Medication Sig   ALPRAZolam  (XANAX ) 0.5 MG tablet TAKE ONE TABLET BY MOUTH NO MORE THAN TWICE A DAY FOR SEVERE ANXIETY   amoxicillin  (AMOXIL ) 500 MG capsule Before dental procedure   aspirin  EC 81 MG tablet Take 81 mg by mouth daily. Swallow whole.   diclofenac Sodium (VOLTAREN) 1 % GEL Apply 1 application  topically 4 (four) times daily as needed (pain).   escitalopram  (LEXAPRO ) 5 MG tablet Take 1 tablet (5 mg total) by mouth daily.   ezetimibe  (ZETIA ) 10 MG tablet Take 1 tablet (10 mg total) by mouth daily.   hydrochlorothiazide  (HYDRODIURIL ) 25 MG tablet Take 1 tablet (25 mg total) by mouth daily.   methocarbamol (ROBAXIN) 500 MG tablet Take 500 mg by mouth every 8 (eight) hours as needed. Prn rarely   metoprolol  succinate (TOPROL -XL) 25 MG 24 hr tablet Take 1 tablet (25 mg total) by mouth daily.   olmesartan  (BENICAR ) 5 MG tablet TAKE ONE TABLET BY MOUTH ONE TIME DAILY   omeprazole  (PRILOSEC) 40 MG capsule Take 1 capsule (40 mg total) by mouth daily.   potassium chloride  (KLOR-CON  M) 10 MEQ tablet Take 1 tablet (10 mEq total) by mouth daily. KEEP OV.   rosuvastatin  (CRESTOR ) 20 MG tablet Take 1 tablet (20 mg total) by mouth daily.   tiZANidine (ZANAFLEX) 2 MG tablet Take 2 mg by mouth every 8 (eight) hours as needed.   [DISCONTINUED] ALPRAZolam  (XANAX ) 0.5 MG tablet TAKE ONE TABLET BY MOUTH NO MORE THAN TWICE A DAY FOR SEVERE ANXIETY   [DISCONTINUED] Docusate Sodium  (DSS) 100 MG CAPS Take 1 capsule by mouth daily.   [DISCONTINUED] estradiol  (ESTRACE ) 0.1 MG/GM vaginal cream 1 gram vaginally twice weekly   No facility-administered encounter medications on file as of 04/24/2024.    Social  History: Social History   Tobacco Use   Smoking status: Former    Current packs/day: 0.00    Average packs/day: 0.5 packs/day for 15.0 years (7.5 ttl pk-yrs)    Types: Cigarettes    Start date: 09/30/1997    Quit date: 09/30/2012    Years since quitting: 11.5   Smokeless tobacco: Never  Vaping Use   Vaping status: Never Used  Substance Use Topics   Alcohol use: Yes    Alcohol/week: 6.0 standard drinks of alcohol    Types: 3 Glasses of wine, 3 Standard drinks or equivalent per week   Drug use: Yes    Types: Marijuana    Family Medical History: Family History  Problem Relation Age of Onset   Hypertension Mother    Diabetes Mellitus II Mother    Alcohol abuse Mother    Anxiety disorder Mother    Arthritis Mother    Diabetes Mother    Early death Mother    Heart disease Mother  Hyperlipidemia Mother    Obesity Mother    Hypertension Father    Stroke Father    Heart disease Father    Alcohol abuse Father    Diabetes Father    Hyperlipidemia Father    Stroke Brother    Heart disease Brother    Alcohol abuse Brother    Hyperlipidemia Brother    Hypertension Brother    CAD Brother    Heart disease Brother    Hyperlipidemia Brother    Colon cancer Paternal Aunt        30's   Heart attack Paternal Grandfather    Colon polyps Neg Hx    Esophageal cancer Neg Hx    Rectal cancer Neg Hx    Stomach cancer Neg Hx     Physical Examination: Vitals:   04/24/24 1410  BP: 136/70    General: Patient is well developed, well nourished, calm, collected, and in no apparent distress. Attention to examination is appropriate.  Respiratory: Patient is breathing without any difficulty.   NEUROLOGICAL:     Awake, alert, oriented to person, place, and time.  Speech is clear and fluent. Fund of knowledge is appropriate.   Cranial Nerves: Pupils equal round and reactive to light.  Facial tone is symmetric.    No posterior lumbar tenderness.   No abnormal lesions on exposed  skin.   Strength: Side Biceps Triceps Deltoid Interossei Grip Wrist Ext. Wrist Flex.  R 5 5 5 5 5 5 5   L 5 5 5 5 5 5 5    Side Iliopsoas Quads Hamstring PF DF EHL  R 5 5 5 5 5 5   L 5 5 5 5 5 5    Reflexes are 3+ and symmetric at the biceps, brachioradialis, patella and achilles.   Hoffman's is present on right, negative on left.  Clonus is not present.   Bilateral upper and lower extremity sensation is intact to light touch.     She has slight tremor in bilateral upper extremities.  She has no pain with IR/ER of both hips.   Gait is slow.   Medical Decision Making  Imaging: Lumbar MRI dated 04/04/24:     I have personally reviewed the images and agree with the above interpretation.   Lumbar xrays dated 01/30/24:  Lumbar spondylosis and DDD, slip L4-L5, and L3-L4. Slight left sided lumbar curve.   No report available for above xrays.    Assessment and Plan: Caitlin Rogers had left THA 10/2023 (she has bilateral THA) and since that time she has constant LBP. No leg pain, but she has intermittent numbness in left leg with constant weakness.   She has known slip L3-L4 with moderate/severe right foraminal stenosis and severe central stenosis. Slip L4-L5 with severe central stenosis and severe left foraminal stenosis. Mild left foraminal stenosis L5-S1. May have component of epidural lipomatosis as well.   No neck or arm pain. Having some balance issues, but left leg is weak. Not sure if having dexterity issues. History of cervical fusion as above.   She is hyper reflexic on exam and has positive hoffmans on right.   Treatment options discussed with patient and following plan made:   - MRI of cervical spine to further evaluate hyper reflexia and positive hoffmans.  - Lumbar xrays with flex/ext when she gets cervical MRI done.  - Will get lumbar PT notes from [facility]. Will need to have done at least 6 weeks of PT prior to discussion of surgery.  - Will plan  for phone follow up to  review above imaging.  - Will let her know after I review PT notes regarding further plan- may need to do more PT at [facility] and/or see one of the surgeons.   I spent a total of 45 minutes in face-to-face and non-face-to-face activities related to this patient's care today including review of outside records, review of imaging, review of symptoms, physical exam, discussion of differential diagnosis, discussion of treatment options, and documentation.   Thank you for involving me in the care of this patient.   ADDENDUM 05/06/24:  PT notes from [facility] in chart- last visit was 03/18/24. I don't see initial evaluation note (only 25 pages sent), may have been 02/08/24? Will see if we can get initial PT evaluation note.   Glade Boys PA-C Dept. of Neurosurgery

## 2024-04-23 ENCOUNTER — Ambulatory Visit: Payer: Self-pay | Admitting: Nurse Practitioner

## 2024-04-24 ENCOUNTER — Inpatient Hospital Stay
Admission: RE | Admit: 2024-04-24 | Discharge: 2024-04-24 | Disposition: A | Discharge status: Home / Self Care | Payer: Self-pay | Source: Ambulatory Visit | Attending: Orthopedic Surgery | Admitting: Orthopedic Surgery

## 2024-04-24 ENCOUNTER — Other Ambulatory Visit: Payer: Self-pay

## 2024-04-24 ENCOUNTER — Institutional Professional Consult (permissible substitution) (INDEPENDENT_AMBULATORY_CARE_PROVIDER_SITE_OTHER): Admitting: Otolaryngology

## 2024-04-24 ENCOUNTER — Ambulatory Visit (INDEPENDENT_AMBULATORY_CARE_PROVIDER_SITE_OTHER): Admitting: Orthopedic Surgery

## 2024-04-24 ENCOUNTER — Inpatient Hospital Stay
Admission: RE | Admit: 2024-04-24 | Discharge: 2024-04-24 | Disposition: A | Payer: Self-pay | Source: Ambulatory Visit | Attending: Orthopedic Surgery | Admitting: Orthopedic Surgery

## 2024-04-24 ENCOUNTER — Encounter: Payer: Self-pay | Admitting: Orthopedic Surgery

## 2024-04-24 VITALS — BP 136/70 | Ht 61.5 in | Wt 175.0 lb

## 2024-04-24 DIAGNOSIS — Z049 Encounter for examination and observation for unspecified reason: Secondary | ICD-10-CM

## 2024-04-24 DIAGNOSIS — M4726 Other spondylosis with radiculopathy, lumbar region: Secondary | ICD-10-CM

## 2024-04-24 DIAGNOSIS — M4316 Spondylolisthesis, lumbar region: Secondary | ICD-10-CM

## 2024-04-24 DIAGNOSIS — M5416 Radiculopathy, lumbar region: Secondary | ICD-10-CM

## 2024-04-24 DIAGNOSIS — M47816 Spondylosis without myelopathy or radiculopathy, lumbar region: Secondary | ICD-10-CM

## 2024-04-24 DIAGNOSIS — R292 Abnormal reflex: Secondary | ICD-10-CM | POA: Diagnosis not present

## 2024-04-24 DIAGNOSIS — M48062 Spinal stenosis, lumbar region with neurogenic claudication: Secondary | ICD-10-CM

## 2024-04-24 DIAGNOSIS — M5126 Other intervertebral disc displacement, lumbar region: Secondary | ICD-10-CM | POA: Diagnosis not present

## 2024-04-24 DIAGNOSIS — Z981 Arthrodesis status: Secondary | ICD-10-CM

## 2024-04-24 NOTE — Patient Instructions (Signed)
 It was so nice to see you today. Thank you so much for coming in.    You have wear and tear in your lower back. You have a slip at L3-L4 and L4-L5 with spinal stenosis (pressure on the spinal cord).  I want to get an MRI of your neck to look into things further. We will get this approved through your insurance and [facility] will call you to schedule the appointment. Ask about your patient responsibility. You do not need to pay this prior to getting MRI, they can bill you.   [facility] (building with the white pillars) is located off of [address]. The address is [address], [place], [place] [address].    When you get the MRI of your neck, remind them to do xrays of your lower back.   After you have the MRI and xrays it can take 14-28 days for me to get the results back. If I don't have them in 2 weeks, we will call to try to get the results.   Once I have the results, we will call you to schedule a follow up phone visit with me to review them.   I will work on getting PT notes from [facility]. Will let you know when I get them to see if you need to revisit PT and/or see one of the surgeons here.   Please do not hesitate to call if you have any questions or concerns. You can also message me in MyChart.   Glade Boys PA-C [phone]     The physicians and staff at [facility] are committed to providing excellent care. You may receive a survey asking for feedback about your experience at our office. We value you your feedback and appreciate you taking the time to to fill it out. The [facility] leadership team is also available to discuss your experience in person, feel free to contact us  [phone].

## 2024-04-25 ENCOUNTER — Other Ambulatory Visit (HOSPITAL_BASED_OUTPATIENT_CLINIC_OR_DEPARTMENT_OTHER): Payer: Self-pay | Admitting: Cardiovascular Disease

## 2024-04-25 DIAGNOSIS — E782 Mixed hyperlipidemia: Secondary | ICD-10-CM

## 2024-04-30 ENCOUNTER — Ambulatory Visit (HOSPITAL_COMMUNITY)
Admission: RE | Admit: 2024-04-30 | Discharge: 2024-04-30 | Disposition: A | Discharge status: Home / Self Care | Source: Ambulatory Visit | Attending: Orthopedic Surgery | Admitting: Orthopedic Surgery

## 2024-04-30 ENCOUNTER — Other Ambulatory Visit

## 2024-04-30 ENCOUNTER — Encounter (INDEPENDENT_AMBULATORY_CARE_PROVIDER_SITE_OTHER): Payer: Self-pay | Admitting: Otolaryngology

## 2024-04-30 ENCOUNTER — Ambulatory Visit (INDEPENDENT_AMBULATORY_CARE_PROVIDER_SITE_OTHER): Admitting: Otolaryngology

## 2024-04-30 VITALS — BP 102/62 | HR 89 | Ht 61.5 in | Wt 170.0 lb

## 2024-04-30 DIAGNOSIS — M48062 Spinal stenosis, lumbar region with neurogenic claudication: Secondary | ICD-10-CM | POA: Insufficient documentation

## 2024-04-30 DIAGNOSIS — J3489 Other specified disorders of nose and nasal sinuses: Secondary | ICD-10-CM

## 2024-04-30 DIAGNOSIS — J310 Chronic rhinitis: Secondary | ICD-10-CM

## 2024-04-30 DIAGNOSIS — M47816 Spondylosis without myelopathy or radiculopathy, lumbar region: Secondary | ICD-10-CM | POA: Insufficient documentation

## 2024-04-30 DIAGNOSIS — R292 Abnormal reflex: Secondary | ICD-10-CM | POA: Diagnosis present

## 2024-04-30 DIAGNOSIS — M5416 Radiculopathy, lumbar region: Secondary | ICD-10-CM | POA: Diagnosis present

## 2024-04-30 DIAGNOSIS — M4316 Spondylolisthesis, lumbar region: Secondary | ICD-10-CM | POA: Diagnosis present

## 2024-04-30 DIAGNOSIS — M4802 Spinal stenosis, cervical region: Secondary | ICD-10-CM

## 2024-04-30 DIAGNOSIS — G9589 Other specified diseases of spinal cord: Secondary | ICD-10-CM | POA: Diagnosis not present

## 2024-04-30 DIAGNOSIS — Z981 Arthrodesis status: Secondary | ICD-10-CM | POA: Diagnosis present

## 2024-04-30 DIAGNOSIS — J342 Deviated nasal septum: Secondary | ICD-10-CM

## 2024-04-30 DIAGNOSIS — S0232XD Fracture of orbital floor, left side, subsequent encounter for fracture with routine healing: Secondary | ICD-10-CM

## 2024-04-30 DIAGNOSIS — R0981 Nasal congestion: Secondary | ICD-10-CM | POA: Diagnosis not present

## 2024-04-30 DIAGNOSIS — S0232XA Fracture of orbital floor, left side, initial encounter for closed fracture: Secondary | ICD-10-CM

## 2024-04-30 DIAGNOSIS — M47812 Spondylosis without myelopathy or radiculopathy, cervical region: Secondary | ICD-10-CM

## 2024-04-30 DIAGNOSIS — Z87891 Personal history of nicotine dependence: Secondary | ICD-10-CM

## 2024-04-30 DIAGNOSIS — J343 Hypertrophy of nasal turbinates: Secondary | ICD-10-CM

## 2024-04-30 MED ORDER — IPRATROPIUM BROMIDE 0.06 % NA SOLN: 2.0000 | Freq: Four times a day (QID) | NASAL | 12 refills | Status: DC | PRN

## 2024-04-30 MED ORDER — FLONASE SENSIMIST 27.5 MCG/SPRAY NA SUSP: 2.0000 | Freq: Every day | NASAL | 12 refills | Status: DC

## 2024-04-30 NOTE — Patient Instructions (Signed)
 Use flonase two sprays each nostril daily for 6 weeks For dripping, use atrovent nasal spray 2 sprays each nostril up to 4 times daily as needed

## 2024-04-30 NOTE — Progress Notes (Signed)
 Dear Dr. Oris, Here is my assessment for our mutual patient, Caitlin Rogers. Thank you for allowing me the opportunity to care for your patient. Please do not hesitate to contact me should you have any other questions. Sincerely, Dr. Eldora Blanch  Otolaryngology Clinic Note  HISTORY:  Initial visit (04/2024): Discussed the use of AI scribe software for clinical note transcription with the patient, who gave verbal consent to proceed.  History of Present Illness Caitlin Rogers is a 68 year old female who presents with nasal congestion and breathing difficulties following a fall with facial fractures.  In late July, Caitlin Rogers experienced a fall resulting in a fractured orbital floor and nose, with subsequent facial bruising. She lost consciousness during the fall. Since then, she has persistent nasal congestion, predominantly on the left side, and difficulty breathing through her nose. Her nasal discharge is clear, with both anterior and posterior rhinorrhea, especially in the mornings. She experiences headaches but denies diplopia or pain with eye movement. She has not had frequent sinus infections or discolored nasal drainage or hyposmia. Of note, she did have some nasal obstruction even prior to trauma. No allergy testing  She is not using any nasal sprays or medications for her symptoms due to uncertainty and concern about interactions with her current medications. She is apprehensive about using decongestants like Sudafed. She has not consulted an ophthalmologist for her current issues but has a history of cataract surgery with lens implants.  Patient additionally denies:  - malocclusion, teeth instability, trismus - enophthalmos, hypoglobus, vision loss or change,  - epistaxis, hearing loss after trauma   She is currently using no nasal medications.  AP/AC: ASA 81  Tobacco: former, quit  PMHx: HTN, HLD, MDD  RADIOGRAPHIC EVALUATION AND INDEPENDENT REVIEW OF OTHER RECORDS:: Caitlin Rogers  02/20/2024: noted fall, bruising and difficulty breathing through the nose. Dx: nasal congestion, facial fracture, ref to ENT Labs CBC 04/18/2024: WBC 3.3, Eos 100 CT Face 02/17/2024 independently interpreted with respect to sinuses/facial fractures: noted left septal deviation, left orbital floor and medial orbital wall fracture with modest displacement; n left nasomax junction fracture Past Medical History:  Diagnosis Date   Acute non-recurrent pansinusitis 08/27/2023   Allergy 2015   Itchy   Anemia 02/26/2023   Anxiety    Aortic atherosclerosis 09/29/2020   On coronary CT    Arthritis    Atypical chest pain 11/11/2016   Cancer (HCC) 03/2022   Basal carcinoma mohs 04/18/22   Cardiac murmur 01/19/2020   Cataract 2023   Removed both eyes   Cervical radiculopathy 02/13/2020   Chest discomfort 01/19/2020   Coronary artery calcification 10/07/2020   COVID-19 06/2019   Current moderate episode of major depressive disorder without prior episode (HCC) 07/06/2020   Dermatitis 11/01/2020   Encounter for pre-operative cardiovascular clearance 07/18/2022   Essential hypertension 11/11/2016   GERD (gastroesophageal reflux disease) 11/11/2016   Hyperlipemia    Hyperlipidemia 11/11/2016   Hypertension    Numbness 01/02/2020   Other spondylosis with myelopathy, cervical region 01/19/2020   Palpitations 11/11/2016   Ringing in the ears    post covid symptom   Rotator cuff tear 10/28/2021   Past Surgical History:  Procedure Laterality Date   APPENDECTOMY     BICEPT TENODESIS Left 10/28/2021   Procedure: BICEPS TENODESIS;  Surgeon: Blanch Priest, MD;  Location: [facility];  Service: Orthopedics;  Laterality: Left;   BUNIONECTOMY     bilat   CERVICAL SPINE SURGERY     CESAREAN SECTION  in toe   COLONOSCOPY  03/10/2016   Dr.Stark   JOINT REPLACEMENT  2023   Left shoulder   MOUTH SURGERY  2019   implant with crown   POLYPECTOMY     REVERSE SHOULDER ARTHROPLASTY Left 10/28/2021    Procedure: Left reverse shoulder arthroplasty, biceps tenodesis;  Surgeon: Tobie Priest, MD;  Location: [facility];  Service: Orthopedics;  Laterality: Left;   SHOULDER ARTHROSCOPY WITH ROTATOR CUFF REPAIR AND SUBACROMIAL DECOMPRESSION Left 07/01/2021   Procedure: Left shoulder arthroscopic subscapularis repair, mini-open supraspinatus repair, subacromial decompression, and bicepst tenodesis;  Surgeon: Tobie Priest, MD;  Location: [facility];  Service: Orthopedics;  Laterality: Left;   SPINE SURGERY  2015   TONSILLECTOMY     TUBAL LIGATION     UPPER GASTROINTESTINAL ENDOSCOPY     WISDOM TOOTH EXTRACTION     Family History  Problem Relation Age of Onset   Hypertension Mother    Diabetes Mellitus II Mother    Alcohol abuse Mother    Anxiety disorder Mother    Arthritis Mother    Diabetes Mother    Rogers death Mother    Heart disease Mother    Hyperlipidemia Mother    Obesity Mother    Hypertension Father    Stroke Father    Heart disease Father    Alcohol abuse Father    Diabetes Father    Hyperlipidemia Father    Stroke Brother    Heart disease Brother    Alcohol abuse Brother    Hyperlipidemia Brother    Hypertension Brother    CAD Brother    Heart disease Brother    Hyperlipidemia Brother    Colon cancer Paternal Aunt        30's   Heart attack Paternal Grandfather    Colon polyps Neg Hx    Esophageal cancer Neg Hx    Rectal cancer Neg Hx    Stomach cancer Neg Hx    Social History   Tobacco Use   Smoking status: Former    Current packs/day: 0.00    Average packs/day: 0.5 packs/day for 15.0 years (7.5 ttl pk-yrs)    Types: Cigarettes    Start date: 09/30/1997    Quit date: 09/30/2012    Years since quitting: 11.5   Smokeless tobacco: Never  Substance Use Topics   Alcohol use: Yes    Alcohol/week: 6.0 standard drinks of alcohol    Types: 3 Glasses of wine, 3 Standard drinks or equivalent per week   Allergies  Allergen Reactions   Codeine Itching    Tramadol  Itching   Current Outpatient Medications  Medication Sig Dispense Refill   ALPRAZolam  (XANAX ) 0.5 MG tablet TAKE ONE TABLET BY MOUTH NO MORE THAN TWICE A DAY FOR SEVERE ANXIETY 60 tablet 0   amoxicillin  (AMOXIL ) 500 MG capsule Before dental procedure     aspirin  EC 81 MG tablet Take 81 mg by mouth daily. Swallow whole.     diclofenac Sodium (VOLTAREN) 1 % GEL Apply 1 application  topically 4 (four) times daily as needed (pain).     escitalopram  (LEXAPRO ) 5 MG tablet Take 1 tablet (5 mg total) by mouth daily. 30 tablet 3   ezetimibe  (ZETIA ) 10 MG tablet TAKE ONE TABLET BY MOUTH ONE TIME DAILY 90 tablet 0   fluticasone (FLONASE SENSIMIST) 27.5 MCG/SPRAY nasal spray Place 2 sprays into the nose daily. 10 g 12   hydrochlorothiazide  (HYDRODIURIL ) 25 MG tablet Take 1 tablet (25 mg total) by mouth daily.  90 tablet 3   methocarbamol (ROBAXIN) 500 MG tablet Take 500 mg by mouth every 8 (eight) hours as needed. Prn rarely     metoprolol  succinate (TOPROL -XL) 25 MG 24 hr tablet Take 1 tablet (25 mg total) by mouth daily. 90 tablet 0   olmesartan  (BENICAR ) 5 MG tablet TAKE ONE TABLET BY MOUTH ONE TIME DAILY 90 tablet 1   omeprazole  (PRILOSEC) 40 MG capsule Take 1 capsule (40 mg total) by mouth daily. 90 capsule 3   potassium chloride  (KLOR-CON  M) 10 MEQ tablet Take 1 tablet (10 mEq total) by mouth daily. KEEP OV. 90 tablet 0   rosuvastatin  (CRESTOR ) 20 MG tablet Take 1 tablet (20 mg total) by mouth daily. 90 tablet 0   tiZANidine (ZANAFLEX) 2 MG tablet Take 2 mg by mouth every 8 (eight) hours as needed.     ipratropium (ATROVENT) 0.06 % nasal spray Place 2 sprays into both nostrils 4 (four) times daily as needed. 15 mL 12   No current facility-administered medications for this visit.   BP 102/62 (BP Location: Left Arm, Patient Position: Sitting, Cuff Size: Large)   Pulse 89   Ht 5' 1.5 (1.562 m)   Wt 170 lb (77.1 kg)   SpO2 95%   BMI 31.60 kg/m   PHYSICAL EXAM:  BP 102/62 (BP Location:  Left Arm, Patient Position: Sitting, Cuff Size: Large)   Pulse 89   Ht 5' 1.5 (1.562 m)   Wt 170 lb (77.1 kg)   SpO2 95%   BMI 31.60 kg/m    Salient findings:  CN II-XII intact  Bilateral EAC clear and TM intact with well pneumatized middle ear spaces Nose: Anterior rhinoscopy reveals septum deviates left, bilateral inferior turbinate hypertrophy right > left.  Nasal endoscopy was indicated to better evaluate the nose and paranasal sinuses, given the patient's history and exam findings, and is detailed below. Modified cottle mildly pos left, ULC support good No lesions of oral cavity/oropharynx No obviously palpable neck masses/lymphadenopathy/thyromegaly No respiratory distress or stridor  No hypoglobus or enopthalmos; EOM intact V2 sensation intact Otherwise no midface stepoff  PROCEDURE:  Prior to initiating any procedures, risks/benefits/alternatives were explained to the patient and verbal consent obtained. Diagnostic Nasal Endoscopy Pre-procedure diagnosis: Concern for nasal obstruction Post-procedure diagnosis: same Indication: See pre-procedure diagnosis and physical exam above Complications: None apparent EBL: 0 mL Anesthesia: Lidocaine  4% and topical decongestant was topically sprayed in each nasal cavity  Description of Procedure:  Patient was identified. A rigid 30 degree endoscope was utilized to evaluate the sinonasal cavities, mucosa, sinus ostia and turbinates and septum.  Overall, signs of mucosal inflammation are noted. No mucopurulence, polyps, or masses noted.   Right Middle meatus: clear Right SE Recess: clear Left MM: clear Left SE Recess: clear Septum as above Photodocumentation was obtained.  CPT CODE -- 31231 - Mod 25   ASSESSMENT:  68 y.o. with:   1. Nasal septal deviation   2. Nasal congestion   3. Nasal obstruction   4. Hypertrophy of both inferior nasal turbinates   5. Chronic rhinitis   6. Closed fracture of left orbital floor, initial  encounter [facility])    Multiple issues but clear structural issues with septal deviation and turbinate hypertrophy; no significant nasal valve collapse. This is a structural issue which will not resolve with medical management. Regardless, will try  We discussed the goals of septoplasty and turbinate reduction, and expectations for postoperative management. Will plan to leave splints in place, and removal was  also discussed. We also discussed nasal obstruction post-operatively until splints in place and pain management.  We discussed R/B/A including pain, infection, bleeding (~2% risk of operative visit for control), persistent symptoms, need for revision surgery, and other risks including damage to surrounding structures, septal perforation, anesthetic complications, among others.  We discussed use of nasal saline spray post-op We also discussed use of intranasal steroid post-operatively until healing occurs  Orbit/eye without issues with EOM intact - encourage optho visit  She will think about all this and contact us  if she wishes to proceed In interim will do flonase daily and atrovent BID (for rhinitis/dripping)  See below regarding exact medications prescribed this encounter including dosages and route: Meds ordered this encounter  Medications   fluticasone (FLONASE SENSIMIST) 27.5 MCG/SPRAY nasal spray    Sig: Place 2 sprays into the nose daily.    Dispense:  10 g    Refill:  12   DISCONTD: ipratropium (ATROVENT) 0.06 % nasal spray    Sig: Place 2 sprays into both nostrils 4 (four) times daily as needed.    Dispense:  15 mL    Refill:  12   ipratropium (ATROVENT) 0.06 % nasal spray    Sig: Place 2 sprays into both nostrils 4 (four) times daily as needed.    Dispense:  15 mL    Refill:  12     Thank you for allowing me the opportunity to care for your patient. Please do not hesitate to contact me should you have any other questions.  Sincerely, Eldora Blanch, MD Otolaryngologist  (ENT), [facility] Phone: [phone] Fax: [phone]  MDM:  Level 4: [phone] Complexity/Problems addressed: mod - chronic problems, multiple Data complexity: mod - independent interpretation of CT; review of note, lab - Morbidity: mod  - Prescription Drug prescribed or managed: y  04/30/2024, 10:51 AM

## 2024-05-09 NOTE — Progress Notes (Unsigned)
 Referring Physician:  Oris Camie BRAVO, NP [address] [place],  [place] [address]  Primary Physician:  Oris Camie BRAVO, NP  History of Present Illness: 05/12/2024 [name] has a history of HTN, CAD, GERD, hyperlipidemia, anxiety, prediabetes, GAD.   Last seen by me on 04/24/24 for constant LBP with no leg pain. She has intermittent numbness in left leg with constant weakness. Pain started after left THA in April of 2025.   She has known slip L3-L4 with moderate/severe right foraminal stenosis and severe central stenosis. Slip L4-L5 with severe central stenosis and severe left foraminal stenosis. Mild left foraminal stenosis L5-S1. May have component of epidural lipomatosis as well.   She was having balance issues at last visit. She has history of cervical fusion. She was hyper reflexic with positive hoffmans on right.   She is here to review her lumbar xrays and cervical MRI. We were working on getting her PT notes as well.   She is about the same. She continues with constant LBP. No leg pain, but she has intermittent numbness in left leg and constant weakness in entire left leg. She has fallen due to weakness. Pain is worse with walking and better with sitting.   She has intermittent numbness in right > left hand. She has intermittent neck pain that is more tolerable. No arm pain.   She did PT for her back at [facility] for 6 weeks.   She is taking diclofenac, robaxin/zanaflex (takes rarely and not at the same time).  Injections not recommended by PMR at [facility]) due to history of AVN (left shoulder and both hips) along with epidural lipomatosis. She was being referred to Dr. Burnetta for surgery.   Tobacco use: Does not smoke.   Bowel/Bladder Dysfunction: none  Conservative measures:  Physical therapy:  has participated with [facility]- last visit was 03/18/24, was initial evaluation 02/08/24 Multimodal medical therapy including regular antiinflammatories:  Tizanidine, Methocarbamol, Diclofenac. Injections: no epidural steroid injections  Past Surgery:  Cervical fusion in [place]  around 2015   Caitlin Rogers has no symptoms of cervical myelopathy.  The symptoms are causing a significant impact on the patient's life.   Review of Systems:  A 10 point review of systems is negative, except for the pertinent positives and negatives detailed in the HPI.  Past Medical History: Past Medical History:  Diagnosis Date   Acute non-recurrent pansinusitis 08/27/2023   Allergy 2015   Itchy   Anemia 02/26/2023   Anxiety    Aortic atherosclerosis 09/29/2020   On coronary CT    Arthritis    Atypical chest pain 11/11/2016   Cancer (HCC) 03/2022   Basal carcinoma mohs 04/18/22   Cardiac murmur 01/19/2020   Cataract 2023   Removed both eyes   Cervical radiculopathy 02/13/2020   Chest discomfort 01/19/2020   Coronary artery calcification 10/07/2020   COVID-19 06/2019   Current moderate episode of major depressive disorder without prior episode (HCC) 07/06/2020   Dermatitis 11/01/2020   Encounter for pre-operative cardiovascular clearance 07/18/2022   Essential hypertension 11/11/2016   GERD (gastroesophageal reflux disease) 11/11/2016   Hyperlipemia    Hyperlipidemia 11/11/2016   Hypertension    Numbness 01/02/2020   Other spondylosis with myelopathy, cervical region 01/19/2020   Palpitations 11/11/2016   Ringing in the ears    post covid symptom   Rotator cuff tear 10/28/2021    Past Surgical History: Past Surgical History:  Procedure Laterality Date   APPENDECTOMY     BICEPT  TENODESIS Left 10/28/2021   Procedure: BICEPS TENODESIS;  Surgeon: Tobie Priest, MD;  Location: [facility];  Service: Orthopedics;  Laterality: Left;   BUNIONECTOMY     bilat   CERVICAL SPINE SURGERY     CESAREAN SECTION     in toe   COLONOSCOPY  03/10/2016   Dr.Stark   JOINT REPLACEMENT  2023   Left shoulder   MOUTH SURGERY  2019   implant with crown    POLYPECTOMY     REVERSE SHOULDER ARTHROPLASTY Left 10/28/2021   Procedure: Left reverse shoulder arthroplasty, biceps tenodesis;  Surgeon: Tobie Priest, MD;  Location: [facility];  Service: Orthopedics;  Laterality: Left;   SHOULDER ARTHROSCOPY WITH ROTATOR CUFF REPAIR AND SUBACROMIAL DECOMPRESSION Left 07/01/2021   Procedure: Left shoulder arthroscopic subscapularis repair, mini-open supraspinatus repair, subacromial decompression, and bicepst tenodesis;  Surgeon: Tobie Priest, MD;  Location: [facility];  Service: Orthopedics;  Laterality: Left;   SPINE SURGERY  2015   TONSILLECTOMY     TUBAL LIGATION     UPPER GASTROINTESTINAL ENDOSCOPY     WISDOM TOOTH EXTRACTION      Allergies: Allergies as of 05/12/2024 - Review Complete 05/12/2024  Allergen Reaction Noted   Codeine Itching 04/05/2020   Tramadol  Itching 05/23/2021    Medications: Outpatient Encounter Medications as of 05/12/2024  Medication Sig   ALPRAZolam  (XANAX ) 0.5 MG tablet TAKE ONE TABLET BY MOUTH NO MORE THAN TWICE A DAY FOR SEVERE ANXIETY   amoxicillin  (AMOXIL ) 500 MG capsule Before dental procedure   aspirin  EC 81 MG tablet Take 81 mg by mouth daily. Swallow whole.   diclofenac Sodium (VOLTAREN) 1 % GEL Apply 1 application  topically 4 (four) times daily as needed (pain).   escitalopram  (LEXAPRO ) 5 MG tablet Take 1 tablet (5 mg total) by mouth daily.   ezetimibe  (ZETIA ) 10 MG tablet TAKE ONE TABLET BY MOUTH ONE TIME DAILY   fluticasone (FLONASE SENSIMIST) 27.5 MCG/SPRAY nasal spray Place 2 sprays into the nose daily.   hydrochlorothiazide  (HYDRODIURIL ) 25 MG tablet Take 1 tablet (25 mg total) by mouth daily.   ipratropium (ATROVENT) 0.06 % nasal spray Place 2 sprays into both nostrils 4 (four) times daily as needed.   methocarbamol (ROBAXIN) 500 MG tablet Take 500 mg by mouth every 8 (eight) hours as needed. Prn rarely   metoprolol  succinate (TOPROL -XL) 25 MG 24 hr tablet Take 1 tablet (25 mg total) by mouth  daily.   olmesartan  (BENICAR ) 5 MG tablet TAKE ONE TABLET BY MOUTH ONE TIME DAILY   omeprazole  (PRILOSEC) 40 MG capsule Take 1 capsule (40 mg total) by mouth daily.   potassium chloride  (KLOR-CON  M) 10 MEQ tablet Take 1 tablet (10 mEq total) by mouth daily. KEEP OV.   rosuvastatin  (CRESTOR ) 20 MG tablet Take 1 tablet (20 mg total) by mouth daily.   tiZANidine (ZANAFLEX) 2 MG tablet Take 2 mg by mouth every 8 (eight) hours as needed.   No facility-administered encounter medications on file as of 05/12/2024.    Social History: Social History   Tobacco Use   Smoking status: Former    Current packs/day: 0.00    Average packs/day: 0.5 packs/day for 15.0 years (7.5 ttl pk-yrs)    Types: Cigarettes    Start date: 09/30/1997    Quit date: 09/30/2012    Years since quitting: 11.6   Smokeless tobacco: Never  Vaping Use   Vaping status: Never Used  Substance Use Topics   Alcohol use: Yes    Alcohol/week:  6.0 standard drinks of alcohol    Types: 3 Glasses of wine, 3 Standard drinks or equivalent per week   Drug use: Yes    Types: Marijuana    Family Medical History: Family History  Problem Relation Age of Onset   Hypertension Mother    Diabetes Mellitus II Mother    Alcohol abuse Mother    Anxiety disorder Mother    Arthritis Mother    Diabetes Mother    Early death Mother    Heart disease Mother    Hyperlipidemia Mother    Obesity Mother    Hypertension Father    Stroke Father    Heart disease Father    Alcohol abuse Father    Diabetes Father    Hyperlipidemia Father    Stroke Brother    Heart disease Brother    Alcohol abuse Brother    Hyperlipidemia Brother    Hypertension Brother    CAD Brother    Heart disease Brother    Hyperlipidemia Brother    Colon cancer Paternal Aunt        30's   Heart attack Paternal Grandfather    Colon polyps Neg Hx    Esophageal cancer Neg Hx    Rectal cancer Neg Hx    Stomach cancer Neg Hx     Physical Examination: Vitals:    05/12/24 0936  BP: 124/68      Awake, alert, oriented to person, place, and time.  Speech is clear and fluent. Fund of knowledge is appropriate.   Cranial Nerves: Pupils equal round and reactive to light.  Facial tone is symmetric.    No posterior lumbar tenderness.   No abnormal lesions on exposed skin.   Strength: Side Biceps Triceps Deltoid Interossei Grip Wrist Ext. Wrist Flex.  R 5 5 5 5 5 5 5   L 5 5 5 5 5 5 5    Side Iliopsoas Quads Hamstring PF DF EHL  R 5 5 5 5 5 5   L 5 5 5 5 5 5    Reflexes are 3+ and symmetric at the biceps, brachioradialis, patella and achilles.   Hoffman's is present on right, negative on left.  Clonus is not present.   Bilateral upper and lower extremity sensation is intact to light touch.     She has negative tinels at wrist and elbow bilaterally. She has positive phalens at wrist bilaterally.   She has slight tremor in bilateral upper extremities.  She has no pain with IR/ER of both hips.   Gait is slow.   Medical Decision Making  Imaging: Cervical MRI dated 04/30/24:  FINDINGS: The craniocervical junction is normal.   There is no significant bone marrow signal abnormality.   The cervical cord is mildly atrophic at the C5-C6 level   C2-C3: There is a mild disc bulge. There is severe left and mild right facet arthropathy. No spinal stenosis   C3-C4: There is mild degenerative disc disease. There is severe bilateral facet arthropathy. There is no significant spinal stenosis. There is severe right neural foraminal stenosis due to a foraminal spur   C4-C5: Prior ACDF with solid arthrodesis. No spinal stenosis or foraminal stenosis   C5-C6: Prior ACDF with solid arthrodesis. No spinal stenosis or significant foraminal stenosis   C6-C7: Prior ACDF with solid arthrodesis. No significant spinal stenosis or foraminal stenosis   C7-T1: Mild degenerative disc disease. There is severe left and mild right facet arthropathy. No spinal  stenosis or significant foraminal stenosis  IMPRESSION: 1.  Prior ACDF at C4-C7 with solid arthrodesis   2. The cord is mildly atrophic at the C5-C6 level likely due to previous cord compression.   3.  There is no significant spinal stenosis.   4. There is severe left facet arthropathy at C2-3, severe bilateral facet arthropathy at C3-4 and severe left facet arthropathy at C7-T1   5. There is a severe right neural foraminal stenosis at C3-4 due to a prominent foraminal spur.   There is no significant change compared with January 02, 2020     Electronically Signed   By: Nancyann Burns M.D.   On: 04/30/2024 10:22   Lumbar xrays dated 04/30/24:  FINDINGS:   LUMBAR SPINE: BONES: Mild curvature of the lumbar spine. Grade 1 anterolisthesis of L4 on L5 and L5 on S1. Approximately 2 mm increase in anterolisthesis at L3-L4 on flexion images and similar appearance on extension images. Approximately 3 mm increase in anterolisthesis at L4-L5 on flexion images; anterolisthesis appears unchanged on extension images. Vertebral body heights are maintained. No radiographic evidence of fracture. No aggressive appearing osseous lesion.   DISCS AND DEGENERATIVE CHANGES: Facet arthrosis particularly within the lower lumbar spine. Mild disc space narrowing at L2-L3. Moderate disc space narrowing at L3-L4, L4-L5, and L5-S1.   SOFT TISSUES: No acute abnormality.   IMPRESSION: 1. Grade 1 anterolisthesis of L4 on L5 and L5 on S1. Approximately 3 mm increase in anterolisthesis at L4-L5 on flexion images, unchanged on extension images. 2. Approximately 2mm increase in anterolisthesis at L3-4 on flexion images, unchanged on extension images. 3. Mild disc space narrowing at L2-L3 and moderate disc space narrowing at L3-L4, L4-L5, and L5-S1. 4. Facet arthrosis, particularly within the lower lumbar spine.   Electronically signed by: Donnice Mania MD 05/05/2024 10:03 PM EDT RP Workstation:  [id number]  I have personally reviewed the images and agree with the above interpretation.   Assessment and Plan: Ms. Bing had left THA 10/2023 (she has bilateral THA) and since that time she has constant LBP. No leg pain, but she has intermittent numbness in left leg with constant weakness.   She has known slip L3-L4 with moderate/severe right foraminal stenosis and severe central stenosis. Slip L4-L5 with severe central stenosis and severe left foraminal stenosis. Mild left foraminal stenosis L5-S1. May have component of epidural lipomatosis as well. She has some movement in slip at L4-L5 on flexion.   She has intermittent numbness in right > left hand. She has intermittent neck pain that is more tolerable. No arm pain. History of cervical fusion as above.   She is hyper reflexic on exam. She has severe left/mild right foraminal stenosis C2-C3 and severe right foraminal stenosis C3-C4. ACDF C4-C7. Note made of cord atrophy at C5-C6 that was likely due to previous cord compression.   She has positive phalens bilaterally at wrist. Numbness and tingling in hands is suspicious for carpal tunnel syndrome.   Treatment options discussed with patient and following plan made:   - She has failed conservative treatment including time, medications, and PT.  - Appointment scheduled with Dr. Clois to discuss possible lumbar surgery options.  - She will get OTC carpal tunnel braces to wear at night. If symptoms persist/get worse, will get EMG of bilateral upper extremities.   I spent a total of 30 minutes in face-to-face and non-face-to-face activities related to this patient's care today including review of outside records, review of imaging, review of symptoms, physical exam, discussion of differential diagnosis, discussion  of treatment options, and documentation.   Glade Boys PA-C Dept. of Neurosurgery

## 2024-05-12 ENCOUNTER — Ambulatory Visit (INDEPENDENT_AMBULATORY_CARE_PROVIDER_SITE_OTHER): Admitting: Family

## 2024-05-12 ENCOUNTER — Encounter: Payer: Self-pay | Admitting: Orthopedic Surgery

## 2024-05-12 ENCOUNTER — Ambulatory Visit (INDEPENDENT_AMBULATORY_CARE_PROVIDER_SITE_OTHER): Admitting: Orthopedic Surgery

## 2024-05-12 ENCOUNTER — Encounter (HOSPITAL_BASED_OUTPATIENT_CLINIC_OR_DEPARTMENT_OTHER): Payer: Self-pay | Admitting: Family

## 2024-05-12 VITALS — BP 136/60 | HR 82 | Ht 61.5 in | Wt 175.8 lb

## 2024-05-12 VITALS — BP 124/68 | Ht 61.5 in | Wt 170.0 lb

## 2024-05-12 DIAGNOSIS — Z981 Arthrodesis status: Secondary | ICD-10-CM

## 2024-05-12 DIAGNOSIS — I10 Essential (primary) hypertension: Secondary | ICD-10-CM | POA: Diagnosis not present

## 2024-05-12 DIAGNOSIS — M5416 Radiculopathy, lumbar region: Secondary | ICD-10-CM

## 2024-05-12 DIAGNOSIS — M542 Cervicalgia: Secondary | ICD-10-CM | POA: Diagnosis not present

## 2024-05-12 DIAGNOSIS — R292 Abnormal reflex: Secondary | ICD-10-CM

## 2024-05-12 DIAGNOSIS — M48062 Spinal stenosis, lumbar region with neurogenic claudication: Secondary | ICD-10-CM

## 2024-05-12 DIAGNOSIS — R202 Paresthesia of skin: Secondary | ICD-10-CM

## 2024-05-12 DIAGNOSIS — M4802 Spinal stenosis, cervical region: Secondary | ICD-10-CM

## 2024-05-12 DIAGNOSIS — M47816 Spondylosis without myelopathy or radiculopathy, lumbar region: Secondary | ICD-10-CM

## 2024-05-12 DIAGNOSIS — E876 Hypokalemia: Secondary | ICD-10-CM

## 2024-05-12 DIAGNOSIS — M48061 Spinal stenosis, lumbar region without neurogenic claudication: Secondary | ICD-10-CM | POA: Diagnosis not present

## 2024-05-12 DIAGNOSIS — R200 Anesthesia of skin: Secondary | ICD-10-CM

## 2024-05-12 DIAGNOSIS — E782 Mixed hyperlipidemia: Secondary | ICD-10-CM

## 2024-05-12 DIAGNOSIS — Z01810 Encounter for preprocedural cardiovascular examination: Secondary | ICD-10-CM | POA: Diagnosis not present

## 2024-05-12 DIAGNOSIS — M4726 Other spondylosis with radiculopathy, lumbar region: Secondary | ICD-10-CM | POA: Diagnosis not present

## 2024-05-12 DIAGNOSIS — M4316 Spondylolisthesis, lumbar region: Secondary | ICD-10-CM

## 2024-05-12 MED ORDER — METOPROLOL SUCCINATE ER 25 MG PO TB24: 25.0000 mg | ORAL_TABLET | Freq: Every day | ORAL | 3 refills | Status: AC

## 2024-05-12 MED ORDER — ROSUVASTATIN CALCIUM 20 MG PO TABS: 20.0000 mg | ORAL_TABLET | Freq: Every day | ORAL | 3 refills | Status: AC

## 2024-05-12 MED ORDER — HYDROCHLOROTHIAZIDE 25 MG PO TABS: 25.0000 mg | ORAL_TABLET | Freq: Every day | ORAL | 3 refills | Status: AC

## 2024-05-12 MED ORDER — EZETIMIBE 10 MG PO TABS: 10.0000 mg | ORAL_TABLET | Freq: Every day | ORAL | 3 refills | Status: AC

## 2024-05-12 MED ORDER — POTASSIUM CHLORIDE CRYS ER 10 MEQ PO TBCR: 10.0000 meq | EXTENDED_RELEASE_TABLET | Freq: Every day | ORAL | 3 refills | Status: AC

## 2024-05-12 NOTE — Progress Notes (Signed)
 Cardiology Office Note   Date:  05/12/2024  ID:  Caitlin Rogers, DOB [birth year], MRN [id number] PCP: Oris Camie BRAVO, NP  [facility] Providers Cardiologist:  Annabella Scarce, MD     History of Present Illness [name] is a 68 y.o. female with CAD, HTN, HLD, prior tobacco use, GERD.   Initial seen 11/10/16 for palpitations at request of her primary care. Treated with metoprolol  after episode of palpitations while being treated for H pylori. Prior ETT with no ischemia. Nuclear stress test 2021 with LVEF >65%, no ischemia. Echo with mild LVH. Monitor 01/2020 with predominantly NSR, rare PVC/PAC. CT cardiac score 09/29/20 of 225 placing her in the 92nd percentile for age/sex matched control. Due to elevated blood pressure Olmesartan  HCTZ previously increased to 40-12.5 mg.  At visit 01/30/22 she was hypotensive and Olmesartan -HCTZ reduced and later transitioned to Olmesartan  alone. At visit 05/02/22 her Rosuvastatin  was increased as LDL not <70. 08/07/22 her Rosuvastatin  was decreased and Zetia  added due to elevated liver enzymes. Later due to LE edema, she was started back on hydrochlorothiazide  and olmesartan  discontinued.   She was last seen 02/12/2023 by Dr. Scarce. She was referred to [facility].   Presents today for follow up. Since last seen had bilaterla hip replacements. Now following with neurosurgery and pending surgery with Dr. Katrina, considering back surgery. A couple of days of low blood pressure at home. Has since resolved. Endorses following a heart healthy diet. Has a goal to drink 62 oz of water. Physically unable to compelte much exercise. Does try to stay active around her home, achieves >4 METS. Excited for upcoming trip to [place] in April  ROS: .Please see the history of present illness.    All other systems reviewed and are negative.   Studies Reviewed EKG Interpretation Date/Time:  Monday May 12 2024 11:32:49 EDT Ventricular Rate:  82 PR  Interval:  186 QRS Duration:  76 QT Interval:  380 QTC Calculation: 443 R Axis:   13  Text Interpretation: Normal sinus rhythm  No acute ST/T wave changes. Confirmed by Vannie Mora ([id number]) on 05/12/2024 11:37:01 AM    Cardiac Studies & Procedures   ______________________________________________________________________________________________   STRESS TESTS  MYOCARDIAL PERFUSION IMAGING 02/16/2020  Interpretation Summary  Nuclear stress EF: 90%.  There was no ST segment deviation noted during stress.  The study is normal.  This is a low risk study.  The left ventricular ejection fraction is hyperdynamic (>65%).  Normal resting and stress perfusion. No ischemia or infarction EF Estimated at 90% May not be that high but is normal   ECHOCARDIOGRAM  ECHOCARDIOGRAM COMPLETE 02/16/2020  Narrative ECHOCARDIOGRAM REPORT    Patient Name:   Caitlin Rogers Date of Exam: 02/16/2020 Medical Rec #:  [id number]     Height:       63.0 in Accession #:    [id number]    Weight:       165.0 lb Date of Birth:  1955/10/13    BSA:          1.782 m Patient Age:    63 years      BP:           142/72 mmHg Patient Gender: F             HR:           68 bpm. Exam Location:  [address]  Procedure: 2D Echo, Cardiac Doppler, Color Doppler, 3D Echo and Strain Analysis  Indications:  R01.1 Murmur  History:        Patient has no prior history of Echocardiogram examinations. Risk Factors:Hypertension and Dyslipidemia. Chest pain. Anxiety. Palpitations.  Sonographer:    Carl Rodgers-Jones RDCS Referring Phys: [id number] RAJAN R [facility]  IMPRESSIONS   1. Global longitudinal strain is -18.9%. Left ventricular ejection fraction, by estimation, is 60 to 65%. The left ventricle has normal function. The left ventricle has no regional wall motion abnormalities. There is mild left ventricular hypertrophy. Left ventricular diastolic parameters were normal. 2. Right ventricular systolic function  is normal. The right ventricular size is normal. There is mildly elevated pulmonary artery systolic pressure. 3. The mitral valve is normal in structure. Mild mitral valve regurgitation. 4. The aortic valve is tricuspid. Aortic valve regurgitation is trivial. Mild aortic valve sclerosis is present, with no evidence of aortic valve stenosis.  FINDINGS Left Ventricle: Global longitudinal strain is -18.9%. Left ventricular ejection fraction, by estimation, is 60 to 65%. The left ventricle has normal function. The left ventricle has no regional wall motion abnormalities. The left ventricular internal cavity size was normal in size. There is mild left ventricular hypertrophy. Left ventricular diastolic parameters were normal.  Right Ventricle: The right ventricular size is normal. No increase in right ventricular wall thickness. Right ventricular systolic function is normal. There is mildly elevated pulmonary artery systolic pressure. The tricuspid regurgitant velocity is 2.57 m/s, and with an assumed right atrial pressure of 10 mmHg, the estimated right ventricular systolic pressure is 36.4 mmHg.  Left Atrium: Left atrial size was normal in size.  Right Atrium: Right atrial size was normal in size.  Pericardium: There is no evidence of pericardial effusion.  Mitral Valve: The mitral valve is normal in structure. Mild mitral valve regurgitation.  Tricuspid Valve: The tricuspid valve is normal in structure. Tricuspid valve regurgitation is mild.  Aortic Valve: The aortic valve is tricuspid. Aortic valve regurgitation is trivial. Mild aortic valve sclerosis is present, with no evidence of aortic valve stenosis.  Pulmonic Valve: The pulmonic valve was normal in structure. Pulmonic valve regurgitation is not visualized.  Aorta: The aortic root is normal in size and structure.  IAS/Shunts: The interatrial septum was not assessed.   LEFT VENTRICLE PLAX 2D LVIDd:         3.40 cm   Diastology LVIDs:         2.30 cm  LV e' lateral:   8.92 cm/s LV PW:         1.00 cm  LV E/e' lateral: 11.0 LV IVS:        1.00 cm  LV e' medial:    7.51 cm/s LVOT diam:     2.00 cm  LV E/e' medial:  13.0 LV SV:         79 LV SV Index:   44 LVOT Area:     3.14 cm   RIGHT VENTRICLE             IVC RV Basal diam:  3.50 cm     IVC diam: 1.70 cm RV S prime:     11.80 cm/s TAPSE (M-mode): 2.4 cm  LEFT ATRIUM             Index       RIGHT ATRIUM          Index LA diam:        4.00 cm 2.24 cm/m  RA Area:     9.17 cm LA Vol (A2C):   30.2 ml 16.95  ml/m RA Volume:   19.50 ml 10.94 ml/m LA Vol (A4C):   25.1 ml 14.09 ml/m LA Biplane Vol: 28.3 ml 15.88 ml/m AORTIC VALVE LVOT Vmax:   105.00 cm/s LVOT Vmean:  74.600 cm/s LVOT VTI:    0.251 m  AORTA Ao Root diam: 3.20 cm Ao Asc diam:  3.00 cm  MITRAL VALVE                TRICUSPID VALVE MV Area (PHT): 3.85 cm     TR Peak grad:   26.4 mmHg MV Decel Time: 197 msec     TR Vmax:        257.00 cm/s MV E velocity: 98.00 cm/s MV A velocity: 102.00 cm/s  SHUNTS MV E/A ratio:  0.96         Systemic VTI:  0.25 m Systemic Diam: 2.00 cm  Vina Gull MD Electronically signed by Vina Gull MD Signature Date/Time: 02/16/2020/3:43:25 PM    Final    MONITORS  LONG TERM MONITOR (3-14 DAYS) 02/19/2020  Narrative [name], DOB 1955/10/28, MRN [id number]  EVENT MONITOR REPORT:   Patient was monitored from 01/19/2020 to 02/02/2020. Indication:                    Palpitations Ordering physician:  Jennifer JONELLE Crape, MD Referring physician:  Jennifer JONELLE Crape, MD   Baseline rhythm: Sinus  Minimum heart rate: 58 BPM.  Average heart rate: 80 BPM.  Maximal heart rate 116 BPM.  Atrial arrhythmia: Rare PACs  Ventricular arrhythmia: Rare PVCs  Conduction abnormality: None significant  Symptoms: None significant   Conclusion: Unremarkable event monitor  Interpreting  cardiologist: Jennifer JONELLE Crape, MD Date: 02/20/2020 3:10 PM    CT SCANS  CT CARDIAC SCORING (SELF PAY ONLY) 09/29/2020  Addendum 09/29/2020  6:19 PM ADDENDUM REPORT: 09/29/2020 18:17  CLINICAL DATA:  35F with hypertension and hyperlipidemia for risk stratification  EXAM: Coronary Calcium  Score  TECHNIQUE: The patient was scanned on a [employer] scanner. Axial non-contrast 3 mm slices were carried out through the heart. The data set was analyzed on a dedicated work station and scored using the Agatson method.  FINDINGS: Non-cardiac: See separate report from [facility].  Ascending Aorta: Normal caliber. Calcification noted in the descending aorta.  Pericardium: Normal  Coronary arteries: Normal origins and anatomy. Right dominant. Calcification note din all three coronary distributions.  IMPRESSION: Coronary calcium  score of 225. This was 92nd percentile for age and sex matched control.  Recommend aggressive risk factor modification including LDL goal <70.  Annabella Scarce, MD   Electronically Signed By: Annabella Scarce On: 09/29/2020 18:17  Narrative EXAM: OVER-READ INTERPRETATION  CT CHEST  The following report is an over-read performed by radiologist Dr. Toribio Aye of [facility] on 09/29/2020. This over-read does not include interpretation of cardiac or coronary anatomy or pathology. The coronary calcium  score interpretation by the cardiologist is attached.  COMPARISON:  None.  FINDINGS: Aortic atherosclerosis. Diffuse bronchial wall thickening with patchy areas of mild peribronchovascular ground-glass attenuation are noted in the lungs bilaterally. Within the visualized portions of the thorax there are no suspicious appearing pulmonary nodules or masses, no pleural effusions, no pneumothorax and no lymphadenopathy. Visualized portions of the upper abdomen are unremarkable. Moderate-sized hiatal hernia. There are no aggressive appearing lytic or blastic lesions noted in the  visualized portions of the skeleton.  IMPRESSION: 1. Findings in the lungs are suggestive of bronchitis, potentially with developing multilobar bilateral bronchopneumonia. 2. Aortic  Atherosclerosis (ICD10-I70.0). 3. Moderate-sized hiatal hernia.  Electronically Signed: By: Toribio Aye M.D. On: 09/29/2020 08:58     ______________________________________________________________________________________________      Risk Assessment/Calculations           Physical Exam VS:  BP 136/60   Pulse 82   Ht 5' 1.5 (1.562 m)   Wt 175 lb 12.8 oz (79.7 kg)   SpO2 98%   BMI 32.68 kg/m        Wt Readings from Last 3 Encounters:  05/12/24 175 lb 12.8 oz (79.7 kg)  05/12/24 170 lb (77.1 kg)  04/30/24 170 lb (77.1 kg)    GEN: Well nourished, well developed in no acute distress NECK: No JVD; No carotid bruits CARDIAC: RRR, no murmurs, rubs, gallops RESPIRATORY:  Clear to auscultation without rales, wheezing or rhonchi  ABDOMEN: Soft, non-tender, non-distended EXTREMITIES:  No edema; No deformity   ASSESSMENT AND PLAN  Preop-considering back surgery with Dr. Katrina.  Exercise tolerance greater than 4 METS. Per AHA/ACC guidelines, she is deemed acceptable risk for the planned procedure without additional cardiovascular testing. Will route to surgical team so they are aware.  Would be permissible to hold Aspirin  5-7 days prior to procedure.  Coronary calcification / Aortic atherosclerosis / HLD, LDL goal < 70 / Transaminitis - Stable with no anginal symptoms. No indication for ischemic evaluation.  Rosuvastatin  previously reduced and Zetia  added as LDL not at goal <70 but elevated liver enzymes. 08/2023 LDL 64.  Continue GDMT aspirin  81 mg daily, Zetia  10 mg daily, rosuvastatin  20 mg daily, Toprol  25 mg daily. Refills provided.    LE edema - controlled on hydrochlorothiazide  25mg  daily. Leg elevation encouraged.    HTN - BP relatively controlled. Likely mildly elevated with some  pain related to her back.  Continue present antihypertensive regimen HCTZ 25 mg daily, Toprol  25 mg daily, olmesartan  5 mg daily.       Dispo: follow up in 1 year  Signed, Reche GORMAN Finder, NP

## 2024-05-12 NOTE — Patient Instructions (Signed)
 It was so nice to see you today. Thank you so much for coming in.    MRI of your neck looks okay with no cord compression.   I think you may have carpal tunnel syndrome in both hands. Get over the counter short carpal tunnel brace to wear at night. If this persists or gets worse, we can consider a nerve test.   I want you to see Dr. Clois to discuss possible surgery options for your back.   Please do not hesitate to call if you have any questions or concerns. You can also message me in MyChart.   Glade Boys PA-C [phone]     The physicians and staff at [facility] at [facility] are committed to providing excellent care. You may receive a survey asking for feedback about your experience at our office. We value you your feedback and appreciate you taking the time to to fill it out. The [facility] leadership team is also available to discuss your experience in person, feel free to contact us  [phone].

## 2024-05-12 NOTE — Patient Instructions (Addendum)
 Medication Instructions:   Recommend holding Aspirin  7 days prior to back surgery.   *If you need a refill on your cardiac medications before your next appointment, please call your pharmacy*  Testing/Procedures: Your EKG today looks good!  Follow-Up: At [facility], you and your health needs are our priority.  As part of our continuing mission to provide you with exceptional heart care, our providers are all part of one team.  This team includes your primary Cardiologist (physician) and Advanced Practice Providers or APPs (Physician Assistants and Nurse Practitioners) who all work together to provide you with the care you need, when you need it.  Your next appointment:   1 year(s)  Provider:   Annabella Scarce, MD, Rosaline Bane, NP, or Reche Finder, NP    We recommend signing up for the patient portal called MyChart.  Sign up information is provided on this After Visit Summary.  MyChart is used to connect with patients for Virtual Visits (Telemedicine).  Patients are able to view lab/test results, encounter notes, upcoming appointments, etc.  Non-urgent messages can be sent to your provider as well.   To learn more about what you can do with MyChart, go to [email or web address].   Other Instructions  Heart Healthy Diet Recommendations: A low-salt diet is recommended. Meats should be grilled, baked, or boiled. Avoid fried foods. Focus on lean protein sources like fish or chicken with vegetables and fruits. The American Heart Association is a Chief Technology Officer!  American Heart Association Diet and Lifeystyle Recommendations   Exercise recommendations: The American Heart Association recommends 150 minutes of moderate intensity exercise weekly. Try 30 minutes of moderate intensity exercise 4-5 times per week. This could include walking, jogging, or swimming. Recommend gentle stretching while awaiting back surgery

## 2024-05-14 ENCOUNTER — Other Ambulatory Visit: Payer: BC Managed Care – PPO

## 2024-05-21 NOTE — Progress Notes (Signed)
 Referring Physician:  Oris Camie BRAVO, NP [address] [place],  [place] [address]  Primary Physician:  Oris Camie BRAVO, NP  History of Present Illness: 05/27/2024 [name] is here today with a chief complaint of back and leg pain.  She experiences significant pain in her buttocks radiating down her left leg, which limits her ability to walk long distances. She often Rogers to sit or lean on a car for support when shopping. She uses a rollator for mobility but sometimes forgets to use it. Physical therapy and medications, including Advil, have not provided sufficient relief.  She has undergone neck surgery in [place], [place] , and experiences numbness on both sides of her body. An MRI of her neck was conducted to assess for adjacent segment disease, but it was not found to be substantive.   Discussed the use of AI scribe software for clinical note transcription with the patient, who gave verbal consent to proceed.  The symptoms are causing a significant impact on the patient's life.   I have utilized the care everywhere function in epic to review the outside records available from external health systems.  Progress Note form Glade Boys, [place] on 05/12/24:   History of Present Illness: 05/12/2024 [name] has a history of HTN, CAD, GERD, hyperlipidemia, anxiety, prediabetes, GAD.    Last seen by me on 04/24/24 for constant LBP with no leg pain. She has intermittent numbness in left leg with constant weakness. Pain started after left THA in April of 2025.    She has known slip L3-L4 with moderate/severe right foraminal stenosis and severe central stenosis. Slip L4-L5 with severe central stenosis and severe left foraminal stenosis. Mild left foraminal stenosis L5-S1. May have component of epidural lipomatosis as well.    She was having balance issues at last visit. She has history of cervical fusion. She was hyper reflexic with positive hoffmans on right.    She  is here to review her lumbar xrays and cervical MRI. We were working on getting her PT notes as well.    She is about the same. She continues with constant LBP. No leg pain, but she has intermittent numbness in left leg and constant weakness in entire left leg. She has fallen due to weakness. Pain is worse with walking and better with sitting.    She has intermittent numbness in right > left hand. She has intermittent neck pain that is more tolerable. No arm pain.    She did PT for her back at [facility] for 6 weeks.    She is taking diclofenac, robaxin/zanaflex (takes rarely and not at the same time).   Injections not recommended by PMR at [facility]) due to history of AVN (left shoulder and both hips) along with epidural lipomatosis. She was being referred to Dr. Burnetta for surgery.    Tobacco use: Does not smoke.    Bowel/Bladder Dysfunction: none   Conservative measures:  Physical therapy:  has participated with [facility]- last visit was 03/18/24, was initial evaluation 02/08/24 Multimodal medical therapy including regular antiinflammatories: Tizanidine, Methocarbamol, Diclofenac. Injections: no epidural steroid injections   Past Surgery:  Cervical fusion in [place]  around 2015   Review of Systems:  A 10 point review of systems is negative, except for the pertinent positives and negatives detailed in the HPI.  Past Medical History: Past Medical History:  Diagnosis Date   Acute non-recurrent pansinusitis 08/27/2023   Allergy 2015   Itchy   Anemia 02/26/2023  Anxiety    Aortic atherosclerosis 09/29/2020   On coronary CT    Arthritis    Atypical chest pain 11/11/2016   Cancer (HCC) 03/2022   Basal carcinoma mohs 04/18/22   Cardiac murmur 01/19/2020   Cataract 2023   Removed both eyes   Cervical radiculopathy 02/13/2020   Chest discomfort 01/19/2020   Coronary artery calcification 10/07/2020   COVID-19 06/2019   Current moderate episode of major depressive disorder  without prior episode (HCC) 07/06/2020   Dermatitis 11/01/2020   Encounter for pre-operative cardiovascular clearance 07/18/2022   Essential hypertension 11/11/2016   GERD (gastroesophageal reflux disease) 11/11/2016   Hyperlipemia    Hyperlipidemia 11/11/2016   Hypertension    Numbness 01/02/2020   Other spondylosis with myelopathy, cervical region 01/19/2020   Palpitations 11/11/2016   Ringing in the ears    post covid symptom   Rotator cuff tear 10/28/2021    Past Surgical History: Past Surgical History:  Procedure Laterality Date   APPENDECTOMY     BICEPT TENODESIS Left 10/28/2021   Procedure: BICEPS TENODESIS;  Surgeon: Tobie Priest, MD;  Location: [facility];  Service: Orthopedics;  Laterality: Left;   BUNIONECTOMY     bilat   CERVICAL SPINE SURGERY     CESAREAN SECTION     in toe   COLONOSCOPY  03/10/2016   Dr.Stark   JOINT REPLACEMENT  2023   Left shoulder   MOUTH SURGERY  2019   implant with crown   POLYPECTOMY     REVERSE SHOULDER ARTHROPLASTY Left 10/28/2021   Procedure: Left reverse shoulder arthroplasty, biceps tenodesis;  Surgeon: Tobie Priest, MD;  Location: [facility];  Service: Orthopedics;  Laterality: Left;   SHOULDER ARTHROSCOPY WITH ROTATOR CUFF REPAIR AND SUBACROMIAL DECOMPRESSION Left 07/01/2021   Procedure: Left shoulder arthroscopic subscapularis repair, mini-open supraspinatus repair, subacromial decompression, and bicepst tenodesis;  Surgeon: Tobie Priest, MD;  Location: [facility];  Service: Orthopedics;  Laterality: Left;   SPINE SURGERY  2015   TONSILLECTOMY     TUBAL LIGATION     UPPER GASTROINTESTINAL ENDOSCOPY     WISDOM TOOTH EXTRACTION      Allergies: Allergies as of 05/27/2024 - Review Complete 05/12/2024  Allergen Reaction Noted   Codeine Itching 04/05/2020   Tramadol  Itching 05/23/2021    Medications:  Current Outpatient Medications:    ALPRAZolam  (XANAX ) 0.5 MG tablet, TAKE ONE TABLET BY MOUTH NO MORE THAN TWICE A DAY  FOR SEVERE ANXIETY, Disp: 60 tablet, Rfl: 0   amoxicillin  (AMOXIL ) 500 MG capsule, Before dental procedure, Disp: , Rfl:    aspirin  EC 81 MG tablet, Take 81 mg by mouth daily. Swallow whole., Disp: , Rfl:    diclofenac Sodium (VOLTAREN) 1 % GEL, Apply 1 application  topically 4 (four) times daily as needed (pain)., Disp: , Rfl:    escitalopram  (LEXAPRO ) 5 MG tablet, Take 1 tablet (5 mg total) by mouth daily., Disp: 30 tablet, Rfl: 3   ezetimibe  (ZETIA ) 10 MG tablet, Take 1 tablet (10 mg total) by mouth daily., Disp: 90 tablet, Rfl: 3   fluticasone (FLONASE SENSIMIST) 27.5 MCG/SPRAY nasal spray, Place 2 sprays into the nose daily., Disp: 10 g, Rfl: 12   hydrochlorothiazide  (HYDRODIURIL ) 25 MG tablet, Take 1 tablet (25 mg total) by mouth daily., Disp: 90 tablet, Rfl: 3   ipratropium (ATROVENT) 0.06 % nasal spray, Place 2 sprays into both nostrils 4 (four) times daily as needed., Disp: 15 mL, Rfl: 12   methocarbamol (ROBAXIN) 500 MG tablet, Take  500 mg by mouth every 8 (eight) hours as needed. Prn rarely, Disp: , Rfl:    metoprolol  succinate (TOPROL -XL) 25 MG 24 hr tablet, Take 1 tablet (25 mg total) by mouth daily., Disp: 90 tablet, Rfl: 3   olmesartan  (BENICAR ) 5 MG tablet, TAKE ONE TABLET BY MOUTH ONE TIME DAILY, Disp: 90 tablet, Rfl: 1   omeprazole  (PRILOSEC) 40 MG capsule, Take 1 capsule (40 mg total) by mouth daily., Disp: 90 capsule, Rfl: 3   potassium chloride  (KLOR-CON  M) 10 MEQ tablet, Take 1 tablet (10 mEq total) by mouth daily., Disp: 90 tablet, Rfl: 3   rosuvastatin  (CRESTOR ) 20 MG tablet, Take 1 tablet (20 mg total) by mouth daily., Disp: 90 tablet, Rfl: 3   tiZANidine (ZANAFLEX) 2 MG tablet, Take 2 mg by mouth every 8 (eight) hours as needed., Disp: , Rfl:   Social History: Social History   Tobacco Use   Smoking status: Former    Current packs/day: 0.00    Average packs/day: 0.5 packs/day for 15.0 years (7.5 ttl pk-yrs)    Types: Cigarettes    Start date: 09/30/1997    Quit date:  09/30/2012    Years since quitting: 11.6   Smokeless tobacco: Never  Vaping Use   Vaping status: Never Used  Substance Use Topics   Alcohol use: Yes    Alcohol/week: 6.0 standard drinks of alcohol    Types: 3 Glasses of wine, 3 Standard drinks or equivalent per week   Drug use: Yes    Types: Marijuana    Family Medical History: Family History  Problem Relation Age of Onset   Hypertension Mother    Diabetes Mellitus II Mother    Alcohol abuse Mother    Anxiety disorder Mother    Arthritis Mother    Diabetes Mother    Early death Mother    Heart disease Mother    Hyperlipidemia Mother    Obesity Mother    Hypertension Father    Stroke Father    Heart disease Father    Alcohol abuse Father    Diabetes Father    Hyperlipidemia Father    Stroke Brother    Heart disease Brother    Alcohol abuse Brother    Hyperlipidemia Brother    Hypertension Brother    CAD Brother    Heart disease Brother    Hyperlipidemia Brother    Colon cancer Paternal Aunt        30's   Heart attack Paternal Grandfather    Colon polyps Neg Hx    Esophageal cancer Neg Hx    Rectal cancer Neg Hx    Stomach cancer Neg Hx     Physical Examination: Vitals:   05/27/24 1554  BP: 128/70    General: Patient is in no apparent distress. Attention to examination is appropriate.  Neck:   Supple.  Full range of motion.  Respiratory: Patient is breathing without any difficulty.   NEUROLOGICAL:     Awake, alert, oriented to person, place, and time.  Speech is clear and fluent.   Cranial Nerves: Pupils equal round and reactive to light.  Facial tone is symmetric.  Facial sensation is symmetric. Shoulder shrug is symmetric. Tongue protrusion is midline.  There is no pronator drift.  Strength: Side Biceps Triceps Deltoid Interossei Grip Wrist Ext. Wrist Flex.  R 5 5 5 5 5 5 5   L 5 5 5 5 5 5 5    Side Iliopsoas Quads Hamstring PF DF EHL  R 5  5 5 5 5 5   L 5 5 5 5 5 5    Reflexes are 3+ and  symmetric at the biceps, triceps, brachioradialis, patella and achilles.   Hoffman's is absent.   Bilateral upper and lower extremity sensation is intact to light touch.    No evidence of dysmetria noted.  Gait is antalgic.     Medical Decision Making  Imaging: MR C spine 04/30/2024 IMPRESSION: 1.  Prior ACDF at C4-C7 with solid arthrodesis   2. The cord is mildly atrophic at the C5-C6 level likely due to previous cord compression.   3.  There is no significant spinal stenosis.   4. There is severe left facet arthropathy at C2-3, severe bilateral facet arthropathy at C3-4 and severe left facet arthropathy at C7-T1   5. There is a severe right neural foraminal stenosis at C3-4 due to a prominent foraminal spur.   There is no significant change compared with January 02, 2020     Electronically Signed   By: Nancyann Burns M.D.   On: 04/30/2024 10:22  MRI L spine 04/04/2024 Grade 1 spondylolisthesis at L3-4 and L4-5 with severe facet arthropathy and ligamentum flavum thickening and thickening of the posterior epidural fat resulting in severe central stenosis at both levels.  This is most severe at L4-5.  Moderate to severe right foraminal stenosis at L3-4.  Severe left foraminal stenosis at L4-5.  Severe facet arthropathy on the left at L5-S1 with mild left foraminal stenosis. I have personally reviewed the images and agree with the above interpretation.  Assessment and Plan: Caitlin Rogers is a pleasant 68 y.o. female with chronic back pain with bilateral sciatica.  She has spondylolisthesis of L3-L4 and L4-5.  She has tried and failed conservative management.  Given her spondylolisthesis and back pain in addition to severe compression of her nerve roots, I have recommended that she consider surgical intervention as there are no longer conservative options for her.  I recommended L3-5 lateral lumbar interbody fusion with percutaneous fixation.  I discussed the planned procedure at length  with the patient, including the risks, benefits, alternatives, and indications. The risks discussed include but are not limited to bleeding, infection, need for reoperation, spinal fluid leak, stroke, vision loss, anesthetic complication, coma, paralysis, and even death. I also described the possibility of psoas weakness and paresthesias. I described in detail that improvement was not guaranteed.  The patient expressed understanding of these risks, and asked that we proceed with surgery. I described the surgery in layman's terms, and gave ample opportunity for questions, which were answered to the best of my ability.    I spent a total of 30 minutes in this patient's care today. This time was spent reviewing pertinent records including imaging studies, obtaining and confirming history, performing a directed evaluation, formulating and discussing my recommendations, and documenting the visit within the medical record.      Thank you for involving me in the care of this patient.      Caitlin Simerly K. Clois MD, [facility]

## 2024-05-27 ENCOUNTER — Ambulatory Visit (INDEPENDENT_AMBULATORY_CARE_PROVIDER_SITE_OTHER): Admitting: Neurosurgery

## 2024-05-27 VITALS — BP 128/70 | Ht 61.0 in | Wt 175.2 lb

## 2024-05-27 DIAGNOSIS — M5441 Lumbago with sciatica, right side: Secondary | ICD-10-CM

## 2024-05-27 DIAGNOSIS — M4316 Spondylolisthesis, lumbar region: Secondary | ICD-10-CM | POA: Diagnosis not present

## 2024-05-27 DIAGNOSIS — M5442 Lumbago with sciatica, left side: Secondary | ICD-10-CM | POA: Diagnosis not present

## 2024-05-27 DIAGNOSIS — G8929 Other chronic pain: Secondary | ICD-10-CM | POA: Diagnosis not present

## 2024-05-27 NOTE — Patient Instructions (Signed)
 Please see below for information in regards to your upcoming surgery:   Planned surgery: L3-5 Lateral Lumbar Interbody Fusion and Posterior Spinal Fusion   Surgery date: 07/30/24 at [facility] (Medical Mall: [address] [place], [place] [address]) - you will find out your arrival time the business day before your surgery.   Pre-op appointment at [facility] Pre-admit Testing: you will receive a call with a date/time for this appointment. If you are scheduled for an in person appointment, Pre-admit Testing is located on the first floor of the [facility] building, [address], [address]. During this appointment, they will advise you which medications you can take the morning of surgery, and which medications you will need to hold for surgery. Labs (such as blood work, EKG) may be done at your pre-op appointment. You are not required to fast for these labs. Should you need to change your pre-op appointment, please call Pre-admit testing at [phone].       Blood thinners:  Aspirin  81mg : stop aspirin  7 days prior, resume aspirin  14 days after      Surgical clearance: we will send a clearance form to Camie Doing, NP & [facility]. They may wish to see you in their office prior to signing the clearance form. If so, they may call you to schedule an appointment.       NSAIDS (Non-steroidal anti-inflammatory drugs): because you are having a fusion, please avoid taking any NSAIDS (examples: ibuprofen, motrin, aleve, naproxen, meloxicam, diclofenac) for 3 months after surgery. Celebrex is an exception and is OK to take, if prescribed. Tylenol  is not an NSAID.    Common restrictions after spine surgery: No bending, lifting, or twisting ("BLT"). Avoid lifting objects heavier than 10 pounds for the first 6 weeks after surgery. Where possible, avoid household activities that involve lifting, bending, reaching, pushing, or pulling such as laundry,  vacuuming, grocery shopping, and childcare. Try to arrange for help from friends and family for these activities while you heal. Do not drive while taking prescription pain medication. Weeks 6 through 12 after surgery: avoid lifting more than 25 pounds.     X-rays after surgery: Because you are having a fusion or arthroplasty: for appointments after your 2 week follow-up: please arrive our office 30 minutes prior to your appointment for x-rays. This applies to every appointment after your 2 week follow-up. Failure to do so may result in your appointment being rescheduled.     How to contact us :  If you have any questions/concerns before or after surgery, you can reach us  at [phone], or you can send a mychart message. We can be reached by phone or mychart 8am-4pm, Monday-Friday.  *Please note: Calls after 4pm are forwarded to a third party answering service. Mychart messages are not routinely monitored during evenings, weekends, and holidays. Please call our office to contact the answering service for urgent concerns during non-business hours.     If you have FMLA/disability paperwork, please drop it off or fax it to [phone]   Appointments/FMLA & disability paperwork: Reche Hait, & Nichole Registered Nurse/Surgery scheduler: Kendelyn, RN & Katie, RN Certified Medical Assistants: Don, CMA, Elenor, CMA, Damien, CMA, & Auston, [place] Physician Assistants: Lyle Decamp, PA-C, Edsel Goods, PA-C & Glade Boys, PA-C Surgeons: Penne Sharps, MD & Reeves Daisy, MD    [facility] PREADMIT TESTING VISIT and SURGERY INFORMATION SHEET   Now that surgery has been scheduled you can anticipate several phone calls  from [facility] services. A pharmacy technician will call you to verify your current list of medications taken at home.               The Pre-Service Center will call to verify your insurance information and to give you billing estimates and  information.             The Preadmit Testing Office will be calling to schedule a visit to obtain information for the anesthesia team and provide instructions on preparation for surgery.  What can you expect for the Preadmit Testing Visit: Appointments may be scheduled in-person or by telephone.  If a telephone visit is scheduled, you may be asked to come into the office to have lab tests or other studies performed.   This visit will not be completed any greater than 14 days prior to your surgery.  If your surgery has been scheduled for a future date, please do not be alarmed if we have not contacted you to schedule an appointment more than a month prior to the surgery date.    Please be prepared to provide the following information during this appointment:            -Personal medical history                                               -Medication and allergy list            -Any history of problems with anesthesia              -Recent lab work or diagnostic studies            -Please notify us  of any needs we should be aware of to provide the best care possible           -You will be provided with instructions on how to prepare for your surgery.    On The Day of Surgery:  You must have a driver to take you home after surgery, you will be asked not to drive for 24 hours following surgery.  Taxi, Gisele and non-medical transport will not be acceptable means of transportation unless you have a responsible individual who will be traveling with you.  Visitors in the surgical area:   2 people will be able to visit you in your room once your preparation for surgery has been completed. During surgery, your visitors will be asked to wait in the Surgery Waiting Area.  It is not a requirement for them to stay, if they prefer to leave and come back.  Your visitor(s) will be given an update once the surgery has been completed.  No visitors are allowed in the initial recovery room to respect patient  privacy and safety.  Once you are more awake and transfer to the secondary recovery area, or are transferred to an inpatient room, visitors will again be able to see you.  To respect and protect your privacy: We will ask on the day of surgery who your driver will be and what the contact number for that individual will be. We will ask if it is okay to share information with this individual, or if there is an alternative individual that we, or the surgeon, should contact to provide updates and information. If family or friends come to the surgical information desk requesting information about you,  who you have not listed with us , no information will be given.   It may be helpful to designate someone as the main contact who will be responsible for updating your other friends and family.    PREADMIT TESTING OFFICE: [phone] SAME DAY SURGERY: [phone] We look forward to caring for you before and throughout the process of your surgery.

## 2024-05-27 NOTE — Addendum Note (Signed)
 Addended by: Keimora Swartout E on: 05/27/2024 05:35 PM   Modules accepted: Orders

## 2024-05-28 ENCOUNTER — Other Ambulatory Visit: Payer: Self-pay

## 2024-05-28 DIAGNOSIS — Z01818 Encounter for other preprocedural examination: Secondary | ICD-10-CM

## 2024-05-28 DIAGNOSIS — M4316 Spondylolisthesis, lumbar region: Secondary | ICD-10-CM

## 2024-05-28 DIAGNOSIS — G8929 Other chronic pain: Secondary | ICD-10-CM

## 2024-06-03 ENCOUNTER — Ambulatory Visit (HOSPITAL_BASED_OUTPATIENT_CLINIC_OR_DEPARTMENT_OTHER)
Admission: RE | Admit: 2024-06-03 | Discharge: 2024-06-03 | Disposition: A | Discharge status: Home / Self Care | Source: Ambulatory Visit | Attending: Nurse Practitioner | Admitting: Nurse Practitioner

## 2024-06-03 DIAGNOSIS — Z780 Asymptomatic menopausal state: Secondary | ICD-10-CM | POA: Diagnosis not present

## 2024-06-03 DIAGNOSIS — Z13820 Encounter for screening for osteoporosis: Secondary | ICD-10-CM | POA: Diagnosis present

## 2024-06-03 DIAGNOSIS — Z09 Encounter for follow-up examination after completed treatment for conditions other than malignant neoplasm: Secondary | ICD-10-CM | POA: Insufficient documentation

## 2024-06-04 ENCOUNTER — Encounter: Payer: Self-pay | Admitting: Nurse Practitioner

## 2024-06-04 ENCOUNTER — Ambulatory Visit: Payer: Self-pay | Admitting: Nurse Practitioner

## 2024-06-05 ENCOUNTER — Telehealth: Payer: Self-pay | Admitting: Nurse Practitioner

## 2024-06-05 NOTE — Telephone Encounter (Signed)
 Surgical Clearance form [facility] received and sent back in folder

## 2024-06-12 NOTE — Telephone Encounter (Signed)
 Clearance form faxed to Neurosurgery

## 2024-06-26 ENCOUNTER — Telehealth: Payer: Self-pay

## 2024-06-26 DIAGNOSIS — F419 Anxiety disorder, unspecified: Secondary | ICD-10-CM

## 2024-06-26 DIAGNOSIS — F321 Major depressive disorder, single episode, moderate: Secondary | ICD-10-CM

## 2024-06-26 MED ORDER — ALPRAZOLAM 0.5 MG PO TABS: ORAL_TABLET | ORAL | 0 refills | Status: AC

## 2024-06-26 NOTE — Telephone Encounter (Signed)
 Received Fax from [facility] to Refill Alprazolam  0.5 mg tab 60 qty Take one tab by mouth no more than 2x per day for severe anxiety.

## 2024-07-02 ENCOUNTER — Telehealth: Payer: Self-pay

## 2024-07-02 NOTE — Telephone Encounter (Signed)
 Patient called to ask about scheduling her PAT appointment. She wanted to schedule appointment around her holiday travels. Provided with PAT telephone number to schedule.   Patient also wanted to know if she would be evaluated for PT while in the hospital. Informed her that she would be getting a PT evaluation while in hospital.   She also asked what clothes she should bring with her to the hospital. Advised her that she can bring whatever she wants, but that incision would likely be tender, so loose, comfortable clothing would probably be best.  Lastly, she asked about restrictions and if she would be able to use the bathroom and perform personal hygiene independently. Advised her to pay close attention to body mechanics and to do her best, and that PT/OT may be able to provide additional advice when she is in the hospital.

## 2024-07-21 ENCOUNTER — Telehealth: Payer: Self-pay | Admitting: Neurosurgery

## 2024-07-21 NOTE — Telephone Encounter (Signed)
 Called patient to inform her that surgical patients are generally asked to arrive for surgery with natural nails. Patient agreeable.

## 2024-07-21 NOTE — Telephone Encounter (Signed)
 The patient has acrylic nails with French tips and gel polish on both fingers and toes. She would like to know if these should be removed prior to surgery

## 2024-07-23 ENCOUNTER — Encounter
Admission: RE | Admit: 2024-07-23 | Discharge: 2024-07-23 | Disposition: A | Discharge status: Home / Self Care | Source: Ambulatory Visit | Attending: Neurosurgery | Admitting: Neurosurgery

## 2024-07-23 ENCOUNTER — Other Ambulatory Visit: Payer: Self-pay

## 2024-07-23 VITALS — BP 122/52 | HR 69 | Resp 16 | Wt 173.3 lb

## 2024-07-23 DIAGNOSIS — Z01812 Encounter for preprocedural laboratory examination: Secondary | ICD-10-CM | POA: Insufficient documentation

## 2024-07-23 DIAGNOSIS — M5442 Lumbago with sciatica, left side: Secondary | ICD-10-CM | POA: Diagnosis not present

## 2024-07-23 DIAGNOSIS — I25118 Atherosclerotic heart disease of native coronary artery with other forms of angina pectoris: Secondary | ICD-10-CM | POA: Diagnosis not present

## 2024-07-23 DIAGNOSIS — M5416 Radiculopathy, lumbar region: Secondary | ICD-10-CM

## 2024-07-23 DIAGNOSIS — M5441 Lumbago with sciatica, right side: Secondary | ICD-10-CM | POA: Insufficient documentation

## 2024-07-23 DIAGNOSIS — I10 Essential (primary) hypertension: Secondary | ICD-10-CM | POA: Insufficient documentation

## 2024-07-23 DIAGNOSIS — M4316 Spondylolisthesis, lumbar region: Secondary | ICD-10-CM | POA: Diagnosis not present

## 2024-07-23 DIAGNOSIS — Z79899 Other long term (current) drug therapy: Secondary | ICD-10-CM | POA: Insufficient documentation

## 2024-07-23 DIAGNOSIS — G8929 Other chronic pain: Secondary | ICD-10-CM | POA: Insufficient documentation

## 2024-07-23 DIAGNOSIS — Z01818 Encounter for other preprocedural examination: Secondary | ICD-10-CM | POA: Diagnosis present

## 2024-07-23 HISTORY — DX: Pneumonia, unspecified organism: J18.9

## 2024-07-23 HISTORY — DX: Prediabetes: R73.03

## 2024-07-23 LAB — URINALYSIS, COMPLETE (UACMP) WITH MICROSCOPIC
Bilirubin Urine: NEGATIVE
Glucose, UA: NEGATIVE mg/dL
Hgb urine dipstick: NEGATIVE
Ketones, ur: NEGATIVE mg/dL
Nitrite: NEGATIVE
Protein, ur: NEGATIVE mg/dL
Specific Gravity, Urine: 1.024 (ref 1.005–1.030)
pH: 5 (ref 5.0–8.0)

## 2024-07-23 LAB — BASIC METABOLIC PANEL WITH GFR
Anion gap: 13 (ref 5–15)
BUN: 22 mg/dL (ref 8–23)
CO2: 28 mmol/L (ref 22–32)
Calcium: 9.7 mg/dL (ref 8.9–10.3)
Chloride: 98 mmol/L (ref 98–111)
Creatinine, Ser: 0.99 mg/dL (ref 0.44–1.00)
GFR, Estimated: >60 mL/min
Glucose, Bld: 91 mg/dL (ref 70–99)
Potassium: 4.2 mmol/L (ref 3.5–5.1)
Sodium: 139 mmol/L (ref 135–145)

## 2024-07-23 LAB — CBC
HCT: 34.8 % — ABNORMAL LOW (ref 36.0–46.0)
Hemoglobin: 11.5 g/dL — ABNORMAL LOW (ref 12.0–15.0)
MCH: 30.3 pg (ref 26.0–34.0)
MCHC: 33 g/dL (ref 30.0–36.0)
MCV: 91.8 fL (ref 80.0–100.0)
Platelets: 196 K/uL (ref 150–400)
RBC: 3.79 MIL/uL — ABNORMAL LOW (ref 3.87–5.11)
RDW: 13.4 % (ref 11.5–15.5)
WBC: 4.3 K/uL (ref 4.0–10.5)
nRBC: 0 % (ref 0.0–0.2)

## 2024-07-23 LAB — TYPE AND SCREEN
ABO/RH(D): A POS
Antibody Screen: NEGATIVE

## 2024-07-23 LAB — SURGICAL PCR SCREEN
MRSA, PCR: NEGATIVE
Staphylococcus aureus: NEGATIVE

## 2024-07-23 NOTE — Progress Notes (Signed)
 Medical Clearance from Dr [name] (06-11-24) on chart-Low Risk  Cardiac Clearance in Epic from 04-24-24 from Reche Gaskins NP-Acceptable Risk

## 2024-07-23 NOTE — Patient Instructions (Addendum)
 Your procedure is scheduled on: 07/30/24- Wednesday Report to the Registration Desk on the 1st floor of the [facility]. To find out your arrival time, please call [phone] between 1PM - 3PM on: 07/29/24 - Tuesday If your arrival time is 6:00 am, do not arrive before that time as the [facility] entrance doors do not open until 6:00 am.  REMEMBER: Instructions that are not followed completely may result in serious medical risk, up to and including death; or upon the discretion of your surgeon and anesthesiologist your surgery may need to be rescheduled.  Do not eat food after midnight the night before surgery.  No gum chewing or hard candies.  You may however, drink CLEAR liquids up to 2 hours before you are scheduled to arrive for your surgery. Do not drink anything within 2 hours of your scheduled arrival time.  Clear liquids include: - water  - apple juice without pulp - gatorade (not RED colors) - black coffee or tea (Do NOT add milk or creamers to the coffee or tea) Do NOT drink anything that is not on this list.  Stop ANY OVER THE COUNTER supplements until after surgery.  You may continue to take Tylenol  if needed for pain up until the day of surgery.  NSAIDS (Non-steroidal anti-inflammatory drugs): because you are having a fusion, please avoid taking any NSAIDS (examples: ibuprofen, motrin, aleve, naproxen, meloxicam, diclofenac) for 3 months after surgery. Celebrex is an exception and is OK to take, if prescribed. Tylenol  is not an NSAID.    We have instructed the patient to hold their blood thinner for surgery as listed below: Aspirin  81mg : stop aspirin  7 days prior, resume aspirin  14 days after   ON THE DAY OF SURGERY ONLY TAKE THESE MEDICATIONS WITH SIPS OF WATER:  metoprolol  succinate  omeprazole  (PRILOSEC)   No Alcohol for 24 hours before or after surgery.  No Smoking including e-cigarettes for 24 hours before surgery.  No chewable tobacco products for at  least 6 hours before surgery.  No nicotine patches on the day of surgery.  Do not use any recreational drugs for at least a week (preferably 2 weeks) before your surgery.  Please be advised that the combination of cocaine and anesthesia may have negative outcomes, up to and including death. If you test positive for cocaine, your surgery will be cancelled.  On the morning of surgery brush your teeth with toothpaste and water, you may rinse your mouth with mouthwash if you wish. Do not swallow any toothpaste or mouthwash.  Use CHG Soap or wipes as directed on instruction sheet.  Do not wear jewelry, make-up, hairpins, clips or nail polish.  For welded (permanent) jewelry: bracelets, anklets, waist bands, etc.  Please have this removed prior to surgery.  If it is not removed, there is a chance that hospital personnel will need to cut it off on the day of surgery.  Do not wear lotions, powders, or perfumes.   Do not shave body hair from the neck down 48 hours before surgery.  Contact lenses, hearing aids and dentures may not be worn into surgery.  Do not bring valuables to the hospital. [facility] is not responsible for any missing/lost belongings or valuables.   Notify your doctor if there is any change in your medical condition (cold, fever, infection).  Wear comfortable clothing (specific to your surgery type) to the hospital.  After surgery, you can help prevent lung complications by doing breathing exercises.  Take deep breaths  and cough every 1-2 hours. Your doctor may order a device called an Incentive Spirometer to help you take deep breaths.  If you are being admitted to the hospital overnight, leave your suitcase in the car. After surgery it may be brought to your room.  In case of increased patient census, it may be necessary for you, the patient, to continue your postoperative care in the Same Day Surgery department.  If you are being discharged the day of surgery, you  will not be allowed to drive home. You will need a responsible individual to drive you home and stay with you for 24 hours after surgery.   If you are taking public transportation, you will need to have a responsible individual with you.  Please call the Pre-admissions Testing Dept. at [phone] if you have any questions about these instructions.  Surgery Visitation Policy:  Patients having surgery or a procedure may have two visitors.  Children under the age of 2 must have an adult with them who is not the patient.  Inpatient Visitation:    Visiting hours are 7 a.m. to 8 p.m. Up to four visitors are allowed at one time in a patient room. The visitors may rotate out with other people during the day.  One visitor age 53 or older may stay with the patient overnight and must be in the room by 8 p.m.   Merchandiser, Retail to address health-related social needs:  [email or web address]    Pre-operative 4 CHG Bath Instructions   You can play a key role in reducing the risk of infection after surgery. Your skin needs to be as free of germs as possible. You can reduce the number of germs on your skin by washing with CHG (chlorhexidine  gluconate) soap before surgery. CHG is an antiseptic soap that kills germs and continues to kill germs even after washing.   DO NOT use if you have an allergy to chlorhexidine /CHG or antibacterial soaps. If your skin becomes reddened or irritated, stop using the CHG and notify one of our RNs at [phone].   Please shower with the CHG soap starting 4 days before surgery using the following schedule: 01/03 - 01/06.    Please keep in mind the following:  DO NOT shave, including legs and underarms, starting the day of your first shower.   You may shave your face at any point before/day of surgery.  Place clean sheets on your bed the day you start using CHG soap. Use a clean washcloth (not used since being washed) for each shower. DO  NOT sleep with pets once you start using the CHG.   CHG Shower Instructions:  If you choose to wash your hair and private area, wash first with your normal shampoo/soap.  After you use shampoo/soap, rinse your hair and body thoroughly to remove shampoo/soap residue.  Turn the water OFF and apply about 3 tablespoons (45 ml) of CHG soap to a CLEAN washcloth.  Apply CHG soap ONLY FROM YOUR NECK DOWN TO YOUR TOES (washing for 3-5 minutes)  DO NOT use CHG soap on face, private areas, open wounds, or sores.  Pay special attention to the area where your surgery is being performed.  If you are having back surgery, having someone wash your back for you may be helpful. Wait 2 minutes after CHG soap is applied, then you may rinse off the CHG soap.  Pat dry with a clean towel  Put on clean clothes/pajamas   If you choose to  wear lotion, please use ONLY the CHG-compatible lotions on the back of this paper.     Additional instructions for the day of surgery: DO NOT APPLY any lotions, deodorants, cologne, or perfumes.   Put on clean/comfortable clothes.  Brush your teeth.  Ask your nurse before applying any prescription medications to the skin.      CHG Compatible Lotions   Aveeno Moisturizing lotion  Cetaphil Moisturizing Cream  Cetaphil Moisturizing Lotion  Clairol Herbal Essence Moisturizing Lotion, Dry Skin  Clairol Herbal Essence Moisturizing Lotion, Extra Dry Skin  Clairol Herbal Essence Moisturizing Lotion, Normal Skin  Curel Age Defying Therapeutic Moisturizing Lotion with Alpha Hydroxy  Curel Extreme Care Body Lotion  Curel Soothing Hands Moisturizing Hand Lotion  Curel Therapeutic Moisturizing Cream, Fragrance-Free  Curel Therapeutic Moisturizing Lotion, Fragrance-Free  Curel Therapeutic Moisturizing Lotion, Original Formula  Eucerin Daily Replenishing Lotion  Eucerin Dry Skin Therapy Plus Alpha Hydroxy Crme  Eucerin Dry Skin Therapy Plus Alpha Hydroxy Lotion  Eucerin Original  Crme  Eucerin Original Lotion  Eucerin Plus Crme Eucerin Plus Lotion  Eucerin TriLipid Replenishing Lotion  Keri Anti-Bacterial Hand Lotion  Keri Deep Conditioning Original Lotion Dry Skin Formula Softly Scented  Keri Deep Conditioning Original Lotion, Fragrance Free Sensitive Skin Formula  Keri Lotion Fast Absorbing Fragrance Free Sensitive Skin Formula  Keri Lotion Fast Absorbing Softly Scented Dry Skin Formula  Keri Original Lotion  Keri Skin Renewal Lotion Keri Silky Smooth Lotion  Keri Silky Smooth Sensitive Skin Lotion  Nivea Body Creamy Conditioning Oil  Nivea Body Extra Enriched Lotion  Nivea Body Original Lotion  Nivea Body Sheer Moisturizing Lotion Nivea Crme  Nivea Skin Firming Lotion  NutraDerm 30 Skin Lotion  NutraDerm Skin Lotion  NutraDerm Therapeutic Skin Cream  NutraDerm Therapeutic Skin Lotion  ProShield Protective Hand Cream  Provon moisturizing lotion  How to Use an Incentive Spirometer  An incentive spirometer is a tool that measures how well you are filling your lungs with each breath. Learning to take long, deep breaths using this tool can help you keep your lungs clear and active. This may help to reverse or lessen your chance of developing breathing (pulmonary) problems, especially infection. You may be asked to use a spirometer: After a surgery. If you have a lung problem or a history of smoking. After a long period of time when you have been unable to move or be active. If the spirometer includes an indicator to show the highest number that you have reached, your health care provider or respiratory therapist will help you set a goal. Keep a log of your progress as told by your health care provider. What are the risks? Breathing too quickly may cause dizziness or cause you to pass out. Take your time so you do not get dizzy or light-headed. If you are in pain, you may need to take pain medicine before doing incentive spirometry. It is harder to take a  deep breath if you are having pain. How to use your incentive spirometer  Sit up on the edge of your bed or on a chair. Hold the incentive spirometer so that it is in an upright position. Before you use the spirometer, breathe out normally. Place the mouthpiece in your mouth. Make sure your lips are closed tightly around it. Breathe in slowly and as deeply as you can through your mouth, causing the piston or the ball to rise toward the top of the chamber. Hold your breath for 3-5 seconds, or for as long as  possible. If the spirometer includes a coach indicator, use this to guide you in breathing. Slow down your breathing if the indicator goes above the marked areas. Remove the mouthpiece from your mouth and breathe out normally. The piston or ball will return to the bottom of the chamber. Rest for a few seconds, then repeat the steps 10 or more times. Take your time and take a few normal breaths between deep breaths so that you do not get dizzy or light-headed. Do this every 1-2 hours when you are awake. If the spirometer includes a goal marker to show the highest number you have reached (best effort), use this as a goal to work toward during each repetition. After each set of 10 deep breaths, cough a few times. This will help to make sure that your lungs are clear. If you have an incision on your chest or abdomen from surgery, place a pillow or a rolled-up towel firmly against the incision when you cough. This can help to reduce pain while taking deep breaths and coughing. General tips When you are able to get out of bed: Walk around often. Continue to take deep breaths and cough in order to clear your lungs. Keep using the incentive spirometer until your health care provider says it is okay to stop using it. If you have been in the hospital, you may be told to keep using the spirometer at home. Contact a health care provider if: You are having difficulty using the spirometer. You have  trouble using the spirometer as often as instructed. Your pain medicine is not giving enough relief for you to use the spirometer as told. You have a fever. Get help right away if: You develop shortness of breath. You develop a cough with bloody mucus from the lungs. You have fluid or blood coming from an incision site after you cough. Summary An incentive spirometer is a tool that can help you learn to take long, deep breaths to keep your lungs clear and active. You may be asked to use a spirometer after a surgery, if you have a lung problem or a history of smoking, or if you have been inactive for a long period of time. Use your incentive spirometer as instructed every 1-2 hours while you are awake. If you have an incision on your chest or abdomen, place a pillow or a rolled-up towel firmly against your incision when you cough. This will help to reduce pain. Get help right away if you have shortness of breath, you cough up bloody mucus, or blood comes from your incision when you cough. This information is not intended to replace advice given to you by your health care provider. Make sure you discuss any questions you have with your health care provider. Document Revised: 09/29/2019 Document Reviewed: 09/29/2019 Elsevier Patient Education  2023 [employer].

## 2024-07-27 ENCOUNTER — Ambulatory Visit: Payer: Self-pay | Admitting: Urgent Care

## 2024-07-27 LAB — URINE CULTURE: Culture: <10000 — AB

## 2024-07-28 ENCOUNTER — Telehealth: Payer: Self-pay | Admitting: Neurosurgery

## 2024-07-28 ENCOUNTER — Other Ambulatory Visit: Payer: Self-pay | Admitting: Neurosurgery

## 2024-07-28 MED ORDER — SULFAMETHOXAZOLE-TRIMETHOPRIM 800-160 MG PO TABS: 1.0000 | ORAL_TABLET | Freq: Two times a day (BID) | ORAL | 0 refills | Status: AC

## 2024-07-28 NOTE — Telephone Encounter (Signed)
 Called patient to make aware of new prescription for bactrim . Told her that we were not concerned about her H&H and that we would still proceed with surgery as planned. Patient to pick up script this afternoon and begin antibiotic this evening.

## 2024-07-28 NOTE — Telephone Encounter (Signed)
 Patient is calling to let our office know that her hemoglobin and hematocrit were low when they were checked. She also states that she had an abnormal urine culture. She would like to make sure this is not going to interfere with her surgery on 07/31/2023.

## 2024-07-30 ENCOUNTER — Encounter: Payer: Self-pay | Admitting: Urgent Care

## 2024-07-30 ENCOUNTER — Ambulatory Visit

## 2024-07-30 ENCOUNTER — Encounter: Payer: Self-pay | Admitting: Neurosurgery

## 2024-07-30 ENCOUNTER — Other Ambulatory Visit: Payer: Self-pay

## 2024-07-30 ENCOUNTER — Encounter
Admission: RE | Disposition: A | Discharge status: Home Health | Payer: Self-pay | Source: Home / Self Care | Attending: Neurosurgery

## 2024-07-30 ENCOUNTER — Observation Stay
Admission: RE | Admit: 2024-07-30 | Discharge: 2024-07-31 | Disposition: A | Discharge status: Home Health | Attending: Neurosurgery | Admitting: Neurosurgery

## 2024-07-30 ENCOUNTER — Ambulatory Visit: Payer: Self-pay | Admitting: Urgent Care

## 2024-07-30 DIAGNOSIS — Z01812 Encounter for preprocedural laboratory examination: Secondary | ICD-10-CM

## 2024-07-30 DIAGNOSIS — M5441 Lumbago with sciatica, right side: Secondary | ICD-10-CM | POA: Diagnosis not present

## 2024-07-30 DIAGNOSIS — M4316 Spondylolisthesis, lumbar region: Principal | ICD-10-CM

## 2024-07-30 DIAGNOSIS — Z79899 Other long term (current) drug therapy: Secondary | ICD-10-CM | POA: Diagnosis not present

## 2024-07-30 DIAGNOSIS — R8271 Bacteriuria: Secondary | ICD-10-CM

## 2024-07-30 DIAGNOSIS — Z981 Arthrodesis status: Secondary | ICD-10-CM | POA: Insufficient documentation

## 2024-07-30 DIAGNOSIS — M6281 Muscle weakness (generalized): Secondary | ICD-10-CM | POA: Diagnosis not present

## 2024-07-30 DIAGNOSIS — M5416 Radiculopathy, lumbar region: Principal | ICD-10-CM

## 2024-07-30 DIAGNOSIS — I25119 Atherosclerotic heart disease of native coronary artery with unspecified angina pectoris: Secondary | ICD-10-CM | POA: Diagnosis not present

## 2024-07-30 DIAGNOSIS — I10 Essential (primary) hypertension: Secondary | ICD-10-CM | POA: Diagnosis not present

## 2024-07-30 DIAGNOSIS — M5440 Lumbago with sciatica, unspecified side: Secondary | ICD-10-CM | POA: Diagnosis present

## 2024-07-30 DIAGNOSIS — Z01818 Encounter for other preprocedural examination: Secondary | ICD-10-CM

## 2024-07-30 DIAGNOSIS — G8929 Other chronic pain: Secondary | ICD-10-CM

## 2024-07-30 DIAGNOSIS — M5442 Lumbago with sciatica, left side: Secondary | ICD-10-CM

## 2024-07-30 DIAGNOSIS — R8290 Unspecified abnormal findings in urine: Secondary | ICD-10-CM

## 2024-07-30 DIAGNOSIS — Z87891 Personal history of nicotine dependence: Secondary | ICD-10-CM | POA: Insufficient documentation

## 2024-07-30 HISTORY — PX: ANTERIOR LATERAL LUMBAR FUSION WITH PERCUTANEOUS SCREW 2 LEVEL: SHX5554

## 2024-07-30 HISTORY — PX: APPLICATION OF INTRAOPERATIVE CT SCAN: SHX6668

## 2024-07-30 MED ORDER — ONDANSETRON HCL 4 MG PO TABS: 4.0000 mg | ORAL_TABLET | Freq: Four times a day (QID) | ORAL | Status: DC | PRN

## 2024-07-30 MED ORDER — CHLORHEXIDINE GLUCONATE 0.12 % MT SOLN
OROMUCOSAL | Status: AC
  Filled 2024-07-30: qty 15

## 2024-07-30 MED ORDER — DEXAMETHASONE SOD PHOSPHATE PF 10 MG/ML IJ SOLN
INTRAMUSCULAR | Status: AC
  Filled 2024-07-30: qty 1

## 2024-07-30 MED ORDER — VANCOMYCIN HCL IN DEXTROSE 1-5 GM/200ML-% IV SOLN
1000.0000 mg | Freq: Once | INTRAVENOUS | Status: AC
  Administered 2024-07-30: 1000 mg via INTRAVENOUS

## 2024-07-30 MED ORDER — METOPROLOL SUCCINATE ER 25 MG PO TB24
25.0000 mg | ORAL_TABLET | Freq: Every day | ORAL | Status: DC
  Administered 2024-07-31: 25 mg via ORAL
  Filled 2024-07-30: qty 1

## 2024-07-30 MED ORDER — ACETAMINOPHEN 325 MG PO TABS: 650.0000 mg | ORAL_TABLET | ORAL | Status: DC | PRN

## 2024-07-30 MED ORDER — PHENYLEPHRINE 80 MCG/ML (10ML) SYRINGE FOR IV PUSH (FOR BLOOD PRESSURE SUPPORT)
PREFILLED_SYRINGE | INTRAVENOUS | Status: AC
  Filled 2024-07-30: qty 10

## 2024-07-30 MED ORDER — MAGNESIUM CITRATE PO SOLN: 1.0000 | Freq: Once | ORAL | Status: DC | PRN

## 2024-07-30 MED ORDER — SODIUM CHLORIDE 0.9% FLUSH
3.0000 mL | Freq: Two times a day (BID) | INTRAVENOUS | Status: DC
  Administered 2024-07-31 (×2): 3 mL via INTRAVENOUS

## 2024-07-30 MED ORDER — REMIFENTANIL HCL 1 MG IV SOLR
INTRAVENOUS | Status: DC | PRN
  Administered 2024-07-30: .2 ug/kg/min via INTRAVENOUS

## 2024-07-30 MED ORDER — BUPIVACAINE-EPINEPHRINE 0.5% -1:200000 IJ SOLN
INTRAMUSCULAR | Status: DC | PRN
  Administered 2024-07-30: 10 mL
  Administered 2024-07-30: 4 mL

## 2024-07-30 MED ORDER — SUCCINYLCHOLINE CHLORIDE 200 MG/10ML IV SOSY
PREFILLED_SYRINGE | INTRAVENOUS | Status: AC
  Filled 2024-07-30: qty 10

## 2024-07-30 MED ORDER — REMIFENTANIL HCL 1 MG IV SOLR
INTRAVENOUS | Status: AC
  Filled 2024-07-30: qty 1000

## 2024-07-30 MED ORDER — DOCUSATE SODIUM 100 MG PO CAPS
100.0000 mg | ORAL_CAPSULE | Freq: Two times a day (BID) | ORAL | Status: DC
  Administered 2024-07-30: 100 mg via ORAL
  Filled 2024-07-30 (×2): qty 1

## 2024-07-30 MED ORDER — HYDROMORPHONE HCL 1 MG/ML IJ SOLN
INTRAMUSCULAR | Status: DC | PRN
  Administered 2024-07-30 (×2): .5 mg via INTRAVENOUS

## 2024-07-30 MED ORDER — LIDOCAINE HCL (CARDIAC) PF 100 MG/5ML IV SOSY
PREFILLED_SYRINGE | INTRAVENOUS | Status: DC | PRN
  Administered 2024-07-30: 80 mg via INTRAVENOUS

## 2024-07-30 MED ORDER — MIDAZOLAM HCL (PF) 2 MG/2ML IJ SOLN
INTRAMUSCULAR | Status: DC | PRN
  Administered 2024-07-30: 2 mg via INTRAVENOUS

## 2024-07-30 MED ORDER — LACTATED RINGERS IV SOLN: INTRAVENOUS | Status: DC

## 2024-07-30 MED ORDER — SEVOFLURANE IN SOLN
RESPIRATORY_TRACT | Status: AC
  Filled 2024-07-30: qty 250

## 2024-07-30 MED ORDER — OXYCODONE HCL 5 MG PO TABS
5.0000 mg | ORAL_TABLET | Freq: Once | ORAL | Status: AC | PRN
  Administered 2024-07-30: 5 mg via ORAL

## 2024-07-30 MED ORDER — POTASSIUM CHLORIDE CRYS ER 10 MEQ PO TBCR
10.0000 meq | EXTENDED_RELEASE_TABLET | Freq: Every day | ORAL | Status: DC
  Administered 2024-07-31: 10 meq via ORAL
  Filled 2024-07-30: qty 1

## 2024-07-30 MED ORDER — LACTATED RINGERS IV SOLN: INTRAVENOUS | Status: DC | PRN

## 2024-07-30 MED ORDER — FENTANYL CITRATE (PF) 100 MCG/2ML IJ SOLN
INTRAMUSCULAR | Status: AC
  Filled 2024-07-30: qty 2

## 2024-07-30 MED ORDER — ACETAMINOPHEN 500 MG PO TABS
1000.0000 mg | ORAL_TABLET | Freq: Four times a day (QID) | ORAL | Status: DC
  Administered 2024-07-30 – 2024-07-31 (×3): 1000 mg via ORAL
  Filled 2024-07-30 (×3): qty 2

## 2024-07-30 MED ORDER — OXYCODONE HCL 5 MG/5ML PO SOLN: 5.0000 mg | Freq: Once | ORAL | Status: AC | PRN

## 2024-07-30 MED ORDER — SODIUM CHLORIDE 0.9% FLUSH: 3.0000 mL | INTRAVENOUS | Status: DC | PRN

## 2024-07-30 MED ORDER — HYDROMORPHONE HCL 1 MG/ML IJ SOLN
0.5000 mg | INTRAMUSCULAR | Status: DC | PRN
  Administered 2024-07-30 (×2): 0.5 mg via INTRAVENOUS

## 2024-07-30 MED ORDER — METHOCARBAMOL 1000 MG/10ML IJ SOLN: 500.0000 mg | Freq: Four times a day (QID) | INTRAMUSCULAR | Status: DC | PRN

## 2024-07-30 MED ORDER — 0.9 % SODIUM CHLORIDE (POUR BTL) OPTIME
TOPICAL | Status: DC | PRN
  Administered 2024-07-30: 250 mL

## 2024-07-30 MED ORDER — FENTANYL CITRATE (PF) 100 MCG/2ML IJ SOLN
25.0000 ug | INTRAMUSCULAR | Status: AC | PRN
  Administered 2024-07-30 (×8): 25 ug via INTRAVENOUS

## 2024-07-30 MED ORDER — ONDANSETRON HCL 4 MG/2ML IJ SOLN
INTRAMUSCULAR | Status: DC | PRN
  Administered 2024-07-30: 4 mg via INTRAVENOUS

## 2024-07-30 MED ORDER — HYDROMORPHONE HCL 1 MG/ML IJ SOLN
INTRAMUSCULAR | Status: AC
  Filled 2024-07-30: qty 1

## 2024-07-30 MED ORDER — METHOCARBAMOL 500 MG PO TABS
500.0000 mg | ORAL_TABLET | Freq: Four times a day (QID) | ORAL | Status: DC | PRN
  Administered 2024-07-30 – 2024-07-31 (×4): 500 mg via ORAL
  Filled 2024-07-30 (×3): qty 1

## 2024-07-30 MED ORDER — HYDROMORPHONE HCL 1 MG/ML IJ SOLN: 0.5000 mg | INTRAMUSCULAR | Status: AC | PRN

## 2024-07-30 MED ORDER — MENTHOL 3 MG MT LOZG: 1.0000 | LOZENGE | OROMUCOSAL | Status: DC | PRN

## 2024-07-30 MED ORDER — SORBITOL 70 % SOLN: 30.0000 mL | Freq: Every day | Status: DC | PRN

## 2024-07-30 MED ORDER — SODIUM CHLORIDE 0.9 % IV SOLN: 250.0000 mL | INTRAVENOUS | Status: DC

## 2024-07-30 MED ORDER — ORAL CARE MOUTH RINSE: 15.0000 mL | Freq: Once | OROMUCOSAL | Status: AC

## 2024-07-30 MED ORDER — METHOCARBAMOL 500 MG PO TABS
ORAL_TABLET | ORAL | Status: AC
  Filled 2024-07-30: qty 1

## 2024-07-30 MED ORDER — CEFAZOLIN SODIUM-DEXTROSE 2-4 GM/100ML-% IV SOLN
INTRAVENOUS | Status: AC
  Filled 2024-07-30: qty 100

## 2024-07-30 MED ORDER — HYDROCHLOROTHIAZIDE 25 MG PO TABS
25.0000 mg | ORAL_TABLET | Freq: Every day | ORAL | Status: DC
  Administered 2024-07-31: 25 mg via ORAL
  Filled 2024-07-30: qty 1

## 2024-07-30 MED ORDER — SUCCINYLCHOLINE CHLORIDE 200 MG/10ML IV SOSY
PREFILLED_SYRINGE | INTRAVENOUS | Status: DC | PRN
  Administered 2024-07-30: 120 mg via INTRAVENOUS

## 2024-07-30 MED ORDER — DEXAMETHASONE SOD PHOSPHATE PF 10 MG/ML IJ SOLN
INTRAMUSCULAR | Status: DC | PRN
  Administered 2024-07-30: 10 mg via INTRAVENOUS

## 2024-07-30 MED ORDER — PROPOFOL 500 MG/50ML IV EMUL
INTRAVENOUS | Status: DC | PRN
  Administered 2024-07-30: 50 ug/kg/min via INTRAVENOUS

## 2024-07-30 MED ORDER — SULFAMETHOXAZOLE-TRIMETHOPRIM 800-160 MG PO TABS
1.0000 | ORAL_TABLET | Freq: Two times a day (BID) | ORAL | Status: DC
  Administered 2024-07-31: 1 via ORAL
  Filled 2024-07-30: qty 1

## 2024-07-30 MED ORDER — ONDANSETRON HCL 4 MG/2ML IJ SOLN
INTRAMUSCULAR | Status: AC
  Filled 2024-07-30: qty 2

## 2024-07-30 MED ORDER — FENTANYL CITRATE (PF) 100 MCG/2ML IJ SOLN
INTRAMUSCULAR | Status: DC | PRN
  Administered 2024-07-30 (×2): 50 ug via INTRAVENOUS

## 2024-07-30 MED ORDER — ENOXAPARIN SODIUM 40 MG/0.4ML IJ SOSY
40.0000 mg | PREFILLED_SYRINGE | INTRAMUSCULAR | Status: DC
  Administered 2024-07-31: 40 mg via SUBCUTANEOUS
  Filled 2024-07-30: qty 0.4

## 2024-07-30 MED ORDER — ACETAMINOPHEN 10 MG/ML IV SOLN: 1000.0000 mg | Freq: Once | INTRAVENOUS | Status: DC | PRN

## 2024-07-30 MED ORDER — LIDOCAINE HCL (PF) 2 % IJ SOLN
INTRAMUSCULAR | Status: AC
  Filled 2024-07-30: qty 5

## 2024-07-30 MED ORDER — OXYCODONE HCL 5 MG PO TABS
5.0000 mg | ORAL_TABLET | ORAL | Status: DC | PRN
  Administered 2024-07-31: 5 mg via ORAL
  Filled 2024-07-30: qty 1

## 2024-07-30 MED ORDER — KETOROLAC TROMETHAMINE 15 MG/ML IJ SOLN
INTRAMUSCULAR | Status: AC
  Filled 2024-07-30: qty 1

## 2024-07-30 MED ORDER — PHENYLEPHRINE HCL (PRESSORS) 10 MG/ML IV SOLN
INTRAVENOUS | Status: AC
  Filled 2024-07-30: qty 1

## 2024-07-30 MED ORDER — ONDANSETRON HCL 4 MG/2ML IJ SOLN: 4.0000 mg | Freq: Four times a day (QID) | INTRAMUSCULAR | Status: DC | PRN

## 2024-07-30 MED ORDER — ACETAMINOPHEN 10 MG/ML IV SOLN
INTRAVENOUS | Status: DC | PRN
  Administered 2024-07-30: 1000 mg via INTRAVENOUS

## 2024-07-30 MED ORDER — OXYCODONE HCL 5 MG PO TABS
10.0000 mg | ORAL_TABLET | ORAL | Status: DC | PRN
  Administered 2024-07-30 – 2024-07-31 (×2): 10 mg via ORAL
  Filled 2024-07-30 (×2): qty 2

## 2024-07-30 MED ORDER — ROSUVASTATIN CALCIUM 10 MG PO TABS
20.0000 mg | ORAL_TABLET | Freq: Every day | ORAL | Status: DC
  Administered 2024-07-30: 20 mg via ORAL
  Filled 2024-07-30: qty 2

## 2024-07-30 MED ORDER — CHLORHEXIDINE GLUCONATE 0.12 % MT SOLN
15.0000 mL | Freq: Once | OROMUCOSAL | Status: AC
  Administered 2024-07-30: 15 mL via OROMUCOSAL

## 2024-07-30 MED ORDER — IRBESARTAN 75 MG PO TABS
37.5000 mg | ORAL_TABLET | Freq: Every day | ORAL | Status: DC
  Administered 2024-07-31: 37.5 mg via ORAL
  Filled 2024-07-30: qty 0.5

## 2024-07-30 MED ORDER — ACETAMINOPHEN 650 MG RE SUPP: 650.0000 mg | RECTAL | Status: DC | PRN

## 2024-07-30 MED ORDER — EZETIMIBE 10 MG PO TABS
10.0000 mg | ORAL_TABLET | Freq: Every day | ORAL | Status: DC
  Administered 2024-07-31: 10 mg via ORAL

## 2024-07-30 MED ORDER — VANCOMYCIN HCL IN DEXTROSE 1-5 GM/200ML-% IV SOLN
INTRAVENOUS | Status: AC
  Filled 2024-07-30: qty 200

## 2024-07-30 MED ORDER — POLYETHYLENE GLYCOL 3350 17 G PO PACK: 17.0000 g | PACK | Freq: Every day | ORAL | Status: DC | PRN

## 2024-07-30 MED ORDER — SURGIFLO WITH THROMBIN (HEMOSTATIC MATRIX KIT) OPTIME
TOPICAL | Status: DC | PRN
  Administered 2024-07-30: 1 via TOPICAL

## 2024-07-30 MED ORDER — SENNA 8.6 MG PO TABS
1.0000 | ORAL_TABLET | Freq: Two times a day (BID) | ORAL | Status: DC
  Administered 2024-07-30: 8.6 mg via ORAL
  Filled 2024-07-30: qty 1

## 2024-07-30 MED ORDER — PHENOL 1.4 % MT LIQD: 1.0000 | OROMUCOSAL | Status: DC | PRN

## 2024-07-30 MED ORDER — CEFAZOLIN SODIUM-DEXTROSE 2-4 GM/100ML-% IV SOLN
2.0000 g | Freq: Once | INTRAVENOUS | Status: AC
  Administered 2024-07-30: 2 g via INTRAVENOUS

## 2024-07-30 MED ORDER — DROPERIDOL 2.5 MG/ML IJ SOLN: 0.6250 mg | Freq: Once | INTRAMUSCULAR | Status: DC | PRN

## 2024-07-30 MED ORDER — OXYCODONE HCL 5 MG PO TABS
ORAL_TABLET | ORAL | Status: AC
  Filled 2024-07-30: qty 1

## 2024-07-30 MED ORDER — IRRISEPT - 450ML BOTTLE WITH 0.05% CHG IN STERILE WATER, USP 99.95% OPTIME
TOPICAL | Status: DC | PRN
  Administered 2024-07-30: 450 mL via TOPICAL

## 2024-07-30 MED ORDER — ALPRAZOLAM 0.25 MG PO TABS
0.5000 mg | ORAL_TABLET | Freq: Two times a day (BID) | ORAL | Status: DC | PRN
  Administered 2024-07-31: 0.5 mg via ORAL
  Filled 2024-07-30: qty 2

## 2024-07-30 MED ORDER — MIDAZOLAM HCL 2 MG/2ML IJ SOLN
INTRAMUSCULAR | Status: AC
  Filled 2024-07-30: qty 2

## 2024-07-30 MED ORDER — PANTOPRAZOLE SODIUM 40 MG PO TBEC
80.0000 mg | DELAYED_RELEASE_TABLET | Freq: Every day | ORAL | Status: DC
  Administered 2024-07-31: 80 mg via ORAL
  Filled 2024-07-30: qty 2

## 2024-07-30 MED ORDER — PHENYLEPHRINE HCL-NACL 20-0.9 MG/250ML-% IV SOLN
INTRAVENOUS | Status: DC | PRN
  Administered 2024-07-30: 20 ug/min via INTRAVENOUS

## 2024-07-30 MED ORDER — PROPOFOL 10 MG/ML IV BOLUS
INTRAVENOUS | Status: DC | PRN
  Administered 2024-07-30: 150 mg via INTRAVENOUS

## 2024-07-30 MED ORDER — ACETAMINOPHEN 10 MG/ML IV SOLN
INTRAVENOUS | Status: AC
  Filled 2024-07-30: qty 100

## 2024-07-30 MED ORDER — KETOROLAC TROMETHAMINE 15 MG/ML IJ SOLN
7.5000 mg | Freq: Four times a day (QID) | INTRAMUSCULAR | Status: AC
  Administered 2024-07-30 – 2024-07-31 (×4): 7.5 mg via INTRAVENOUS
  Filled 2024-07-30 (×3): qty 1

## 2024-07-30 NOTE — Anesthesia Preprocedure Evaluation (Signed)
 "                                  Anesthesia Evaluation  Patient identified by MRN, date of birth, ID band Patient awake    Reviewed: Allergy & Precautions, H&P , NPO status , Patient's Chart, lab work & pertinent test results, reviewed documented beta blocker date and time   Airway Mallampati: II  TM Distance: >3 FB Neck ROM: full    Dental  (+) Teeth Intact   Pulmonary pneumonia, resolved, former smoker   Pulmonary exam normal        Cardiovascular Exercise Tolerance: Poor hypertension, On Medications + angina with exertion + CAD  Normal cardiovascular exam+ Valvular Problems/Murmurs  Rhythm:regular Rate:Normal     Neuro/Psych   Anxiety Depression     Neuromuscular disease  negative psych ROS   GI/Hepatic Neg liver ROS,GERD  Medicated,,  Endo/Other  negative endocrine ROS    Renal/GU negative Renal ROS  negative genitourinary   Musculoskeletal   Abdominal   Peds  Hematology  (+) Blood dyscrasia, anemia   Anesthesia Other Findings Past Medical History: 08/27/2023: Acute non-recurrent pansinusitis 2015: Allergy     Comment:  Itchy 02/26/2023: Anemia No date: Anxiety 09/29/2020: Aortic atherosclerosis     Comment:  On coronary CT  No date: Arthritis 11/11/2016: Atypical chest pain 03/2022: Cancer (HCC)     Comment:  Basal carcinoma mohs 04/18/22 01/19/2020: Cardiac murmur 2023: Cataract     Comment:  Removed both eyes 02/13/2020: Cervical radiculopathy 01/19/2020: Chest discomfort 10/07/2020: Coronary artery calcification 06/2019: COVID-19 07/06/2020: Current moderate episode of major depressive disorder  without prior episode (HCC) 11/01/2020: Dermatitis 07/18/2022: Encounter for pre-operative cardiovascular clearance 11/11/2016: Essential hypertension 11/11/2016: GERD (gastroesophageal reflux disease) No date: Hyperlipemia 11/11/2016: Hyperlipidemia No date: Hypertension 01/02/2020: Numbness 01/19/2020: Other spondylosis with  myelopathy, cervical region 11/11/2016: Palpitations No date: Pneumonia 02/26/2023: Post-concussion syndrome No date: Pre-diabetes No date: Ringing in the ears     Comment:  post covid symptom 10/28/2021: Rotator cuff tear Past Surgical History: No date: APPENDECTOMY 10/28/2021: BICEPT TENODESIS; Left     Comment:  Procedure: BICEPS TENODESIS;  Surgeon: Tobie Priest, MD;              Location: [facility] ORS;  Service: Orthopedics;  Laterality:               Left; No date: Bilateral Hip Replacement      Comment:  2023 - 2024 No date: BUNIONECTOMY     Comment:  bilat No date: CERVICAL SPINE SURGERY No date: CESAREAN SECTION     Comment:  x 2 03/10/2016: COLONOSCOPY     Comment:  Dr.Stark 2023: EYE SURGERY; Bilateral     Comment:  cataract both eyes with lense replacement 2023: JOINT REPLACEMENT     Comment:  Left shoulder 2019: MOUTH SURGERY     Comment:  implant with crown No date: POLYPECTOMY 10/28/2021: REVERSE SHOULDER ARTHROPLASTY; Left     Comment:  Procedure: Left reverse shoulder arthroplasty, biceps               tenodesis;  Surgeon: Tobie Priest, MD;  Location: [facility]               ORS;  Service: Orthopedics;  Laterality: Left; 07/01/2021: SHOULDER ARTHROSCOPY WITH ROTATOR CUFF REPAIR AND  SUBACROMIAL DECOMPRESSION; Left     Comment:  Procedure: Left shoulder arthroscopic subscapularis  repair, mini-open supraspinatus repair, subacromial               decompression, and bicepst tenodesis;  Surgeon: Tobie Priest, MD;  Location: [facility];  Service:               Orthopedics;  Laterality: Left; 2015: SPINE SURGERY No date: TONSILLECTOMY No date: TUBAL LIGATION No date: UPPER GASTROINTESTINAL ENDOSCOPY No date: WISDOM TOOTH EXTRACTION BMI    Body Mass Index: 32.12 kg/m     Reproductive/Obstetrics negative OB ROS                              Anesthesia Physical Anesthesia Plan  ASA: 3  Anesthesia  Plan: General ETT   Post-op Pain Management:    Induction:   PONV Risk Score and Plan: 4 or greater  Airway Management Planned:   Additional Equipment:   Intra-op Plan:   Post-operative Plan:   Informed Consent: I have reviewed the patients History and Physical, chart, labs and discussed the procedure including the risks, benefits and alternatives for the proposed anesthesia with the patient or authorized representative who has indicated his/her understanding and acceptance.     Dental Advisory Given  Plan Discussed with: CRNA  Anesthesia Plan Comments:         Anesthesia Quick Evaluation  "

## 2024-07-30 NOTE — Discharge Instructions (Signed)
 Your surgeon has performed an operation on your lumbar spine (low back) to relieve pressure on one or more nerves. Many times, patients feel better immediately after surgery and can "overdo it." Even if you feel well, it is important that you follow these activity guidelines. If you do not let your back heal properly from the surgery, you can increase the chance of hardware complications and/or return of your symptoms. The following are instructions to help in your recovery once you have been discharged from the hospital.  Do not use NSAIDs for 3 months after surgery.  *Regarding compression stockings-  Please wear day and night until you are walking a couple hundred feet three times a day.   Activity    No bending, lifting, or twisting ("BLT"). Avoid lifting objects heavier than 10 pounds (gallon milk jug).  Where possible, avoid household activities that involve lifting, bending, pushing, or pulling such as laundry, vacuuming, grocery shopping, and childcare. Try to arrange for help from friends and family for these activities while your back heals.  Increase physical activity slowly as tolerated.  Taking short walks is encouraged, but avoid strenuous exercise. Do not jog, run, bicycle, lift weights, or participate in any other exercises unless specifically allowed by your doctor. Avoid prolonged sitting, including car rides.  Talk to your doctor before resuming sexual activity.  You should not drive until cleared by your doctor.  Until released by your doctor, you should not return to work or school.  You should rest at home and let your body heal.   You may shower three days after your surgery.  After showering, lightly dab your incision dry. Do not take a tub bath or go swimming for 3 weeks, or until approved by your doctor at your follow-up appointment.  If you smoke, we strongly recommend that you quit.  Smoking has been proven to interfere with normal healing in your back and will  dramatically reduce the success rate of your surgery. Please contact [facility] (800-QUIT-NOW) and use the resources at www.[facility].com for assistance in stopping smoking.  Surgical Incision   If you have a dressing on your incision, you may remove it three days after your surgery. Keep your incision area clean and dry.  Your incision was closed with Dermabond glue. The glue should begin to peel away within about a week.  Diet            You may return to your usual diet. Be sure to stay hydrated.  When to Contact Us   Although your surgery and recovery will likely be uneventful, you may have some residual numbness, aches, and pains in your back and/or legs. This is normal and should improve in the next few weeks.  However, should you experience any of the following, contact us  immediately: New numbness or weakness Pain that is progressively getting worse, and is not relieved by your pain medications or rest Bleeding, redness, swelling, pain, or drainage from surgical incision Chills or flu-like symptoms Fever greater than 101.0 F (38.3 C) Problems with bowel or bladder functions Difficulty breathing or shortness of breath Warmth, tenderness, or swelling in your calf  Contact Information How to contact us :  If you have any questions/concerns before or after surgery, you can reach us  at [phone], or you can send a mychart message. We can be reached by phone or mychart 8am-4pm, Monday-Friday.  *Please note: Calls after 4pm are forwarded to a third party answering service. Mychart messages are not routinely monitored during evenings,  weekends, and holidays. Please call our office to contact the answering service for urgent concerns during non-business hours.

## 2024-07-30 NOTE — Anesthesia Procedure Notes (Signed)
 Procedure Name: Intubation Date/Time: 07/30/2024 2:17 PM  Performed by: Rumor Sun, CRNAPre-anesthesia Checklist: Patient identified, Emergency Drugs available, Suction available and Patient being monitored Patient Re-evaluated:Patient Re-evaluated prior to induction Oxygen Delivery Method: Circle System Utilized Preoxygenation: Pre-oxygenation with 100% oxygen Induction Type: IV induction Ventilation: Mask ventilation without difficulty Laryngoscope Size: McGrath and 3 Grade View: Grade I Tube type: Oral Tube size: 7.0 mm Number of attempts: 1 Airway Equipment and Method: Stylet and Oral airway Placement Confirmation: ETT inserted through vocal cords under direct vision, positive ETCO2 and breath sounds checked- equal and bilateral Secured at: 21 cm Tube secured with: Tape Dental Injury: Teeth and Oropharynx as per pre-operative assessment  Comments: Easy, atraumatic intubation, lips, teeth and tongue unchanged. Head and neck midline.

## 2024-07-30 NOTE — Transfer of Care (Signed)
 Immediate Anesthesia Transfer of Care Note  Patient: Caitlin Rogers  Procedure(s) Performed: ANTERIOR LATERAL LUMBAR FUSION WITH PERCUTANEOUS SCREW 2 LEVEL (Left) APPLICATION OF INTRAOPERATIVE CT SCAN (Left)  Patient Location: PACU  Anesthesia Type:General  Level of Consciousness: awake, alert , and oriented  Airway & Oxygen Therapy: Patient Spontanous Breathing and Patient connected to face mask oxygen  Post-op Assessment: Report given to RN and Post -op Vital signs reviewed and stable  Post vital signs: Reviewed and stable  Last Vitals:  Vitals Value Taken Time  BP 162/78 07/30/24 17:15  Temp 36.2 C 07/30/24 17:15  Pulse 86 07/30/24 17:18  Resp 23 07/30/24 17:18  SpO2 100 % 07/30/24 17:18  Vitals shown include unfiled device data.  Last Pain:  Vitals:   07/30/24 1715  TempSrc:   PainSc: 8          Complications: No notable events documented.

## 2024-07-30 NOTE — Op Note (Signed)
 Indications: Ms. Nereida Avera is a 69 y.o. female with M43.16 Spondylolisthesis of lumbar region, M54.42, M54.41, G89.29 Chronic bilateral low back pain with bilateral sciatica   Findings: expansion of disc space  Preoperative Diagnosis: M43.16 Spondylolisthesis of lumbar region, M54.42, M54.41, G89.29 Chronic bilateral low back pain with bilateral sciatica  Postoperative Diagnosis: same   EBL: 50 ml IVF: see anesthesia record Drains: none Disposition: Extubated and Stable to PACU Complications: none  A foley catheter was placed.   Preoperative Note:   Risks of surgery discussed include: infection, bleeding, stroke, coma, death, paralysis, CSF leak, nerve/spinal cord injury, numbness, tingling, weakness, complex regional pain syndrome, recurrent stenosis and/or disc herniation, vascular injury, development of instability, neck/back pain, need for further surgery, persistent symptoms, development of deformity, and the risks of anesthesia. The patient understood these risks and agreed to proceed.  NAME OF ANTERIOR PROCEDURE:               1. Anterior lumbar interbody fusion via a left lateral retroperitoneal approach at L3/4 and L4/5 2. Placement of a Lordotic Globus Hedron interbody cage, filled with Demineralized Bone Matrix  NAME OF POSTERIOR PROCEDURE 1. Posterior instrumentation using Nuvasive Reline Instrumentation 2. Posterolateral fusion, L3/4 and L4/5, utilizing demineralized bone matrix 3. Use of Stereotaxis   PROCEDURE:  Patient was brought to the operating room, intubated, turned to the lateral position.  All pressure points were checked and double-checked.  The patient was prepped and draped in the standard fashion. Prior to prepping, fluoroscopy was brought in and the patient was positioned with a large bump under the contralateral side between the iliac crest and rib cage, allowing the area between the iliac crest and the lateral aspect of the rib cage to open and  increase the ability to reach inferiorly, to facilitate entry into the disc space.  The incision was marked upon the skin both the location of the disc space as well as the superior most aspect of the iliac crest.  Based on the identification of the disc space an incision was prepared, marked upon the skin and eventually was used for our lateral incision.  The fluoroscopy was turned into a cross table A/P image in order to confirm that the patients spine remained in a perpendicular trajectory to the floor without rotation.  Once confirming that all the pressure points were checked and double-checked and the patient remained in sturdy position strapped down in this slightly jack-knifed lateral position, the patient was prepped and draped in standard fashion.  The skin was injected with local anesthetic, then incised until the abdominal wall fascia was noted.  I bluntly dissected posteriorly until we were able to identify the posterior musculature near petits triangle.  At this point, using primarily blunt dissection with our finger aided with a metzenbaum scissor, were able to enter the retroperitoneal cavity.  The retroperitoneal potential space was opened further until palpating out the psoas muscle, the medial aspect of the iliac crest, the medial aspect of the last rib and continued to define the retroperitoneal space with blunt dissection in order to facilitate safe placement of our dilators.    While protecting by dissecting directly onto a finger in the retroperitoneum, the retroperitoneal space was entered safely from the lateral incision and the initial dilator placed onto the muscle belly of the psoas.  While directly stimulating the dilator and after radiographically confirming our location relative to the disc space, I placed the dilator through the psoas.  The dilators were stimulated to  ensure remaining safely away from any of the lumbar plexus nerves; the dilators were repositioned until no  pathologic stimulation was appreciated.  Once I had confirmed the location of our initial dilator radiographically, a K-wire secured the dilator into the L4/5 disc space and confirmed position under A/P and lateral fluoroscopy.  At this point, I dilated up with direct stimulation to confirm lack of pathologic stimulation.  Once all the dilators were in position, I placed in the retractor and secured it onto the table, locked into position and confirmed under A/P and lateral fluoroscopy to confirm our approach angle to the disc space as well as location relative to the disc space.  I then placed the muscle stimulator in through the working channel down to the vertebral body, stimulating the entire lateral surface of the vertebral body and any of the visualized psoas muscle that was adjacent to the retractor, confirming again the safe passage to the psoas before we began performing the discectomy.  At this point, we began our discectomy at L4/5.  The disc was incised laterally throughout the extent of our exposure. Using a combination of pituitary rongeurs, Kerrison rongeurs, rasps, curettes of various sorts, we were able to begin to clean out the disc space.  Once we had cleaned out the majority of the disc space, we then cut the lateral annulus with a cob, breaking the lateral annual attachments on the contralateral side by subtly working the cob through the annulus while using flouroscopy.  Care was taken not to extend further than required after cutting the annular attachments.  After this had been performed, we prepared the endplates for placement of our graft, sized a graft to the disc space by serially dilating up in trial sizes until we confirmed that our graft would be well positioned, allowing distraction while maintaining good grip.  This was confirmed under A/P and lateral fluoroscopy in order to ensure its placement as an eventual trial for placement of our final graft.  We irrigated with saline.   Once confirmed placement, the Hedron implant filled with allograft was impacted into position at L4/5.   Through a combination of intradiscal distraction and anterior releasing, we were able to correct the anterior deformity during disc preparation and placement of the graft.  After performing the lateral lumbar interbody procedure at L4/5, attention was moved to the L3/4 level.   While protecting by dissecting directly onto a finger in the retroperitoneum, the retroperitoneal space was entered safely from the lateral incision and the initial dilator placed onto the muscle belly of the psoas.  While directly stimulating the dilator and after radiographically confirming our location relative to the disc space, I placed the dilator through the psoas.  The dilators were stimulated to ensure remaining safely away from any of the lumbar plexus nerves; the dilators were repositioned until no pathologic stimulation was appreciated.  Once I had confirmed the location of our initial dilator radiographically, a K-wire secured the dilator into the L3/4 disc space and confirmed position under A/P and lateral fluoroscopy.  At this point, I dilated up with direct stimulation to confirm lack of pathologic stimulation.  Once all the dilators were in position, I placed in the retractor and secured it to the table, locked into position and confirmed under lateral fluoroscopy.  I then placed the muscle stimulator in through the working channel down to the vertebral body, stimulating the entire lateral surface of the vertebral body and any of the visualized psoas muscle that was  adjacent to the retractor, confirming again the safe passage to the psoas before we began performing the discectomy.  At this point, we began our discectomy at L3/4.  The disc was incised laterally throughout the extent of our exposure. Using a combination of pituitary rongeurs, Kerrison rongeurs, rasps, curettes of various sorts, we were able to  begin to clean out the disc space.  Once we had cleaned out the majority of the disc space, we then cut the lateral annulus with a cob, breaking the lateral annual attachments on the contralateral side by subtly working the cob through the annulus while using flouroscopy.  Care was taken not to extend further than required after cutting the annular attachments.  After this had been performed, we prepared the endplates for placement of our graft, sized a graft to the disc space by serially dilating up in trial sizes until we confirmed that our graft would be well positioned, allowing distraction while maintaining good grip.  This was confirmed under A/P and lateral fluoroscopy in order to ensure its placement as an eventual trial for placement of our final graft.  We irrigated with bacteriostatic saline.  Once confirmed placement, the Hedron graft was impacted into position at L3/4.     At this point, final radiographs were performed, and we began closure.  The wound was closed using 0 Vicryl interrupted suture in the fascia and 2-0 Vicryl inverted suture were placed in the subcutaneous tissue and dermis. 3-0 monocryl was used for final closure. Dermabond was used to close the skin.    After closing the anterior part in layers, the patient was repositioned into prone position.  All pressure points were checked and double-checked.  The posterior operative site was prepped and draped in standard fashion.  The stereotactic array was placed.  Stereotactic images were acquired using intraoperative CT scanning.  This was registered to the patient.  Using stereotaxis, screw trajectories were planned and incisions made.  The pedicles from L3 to L5 were cannulated bilaterally and K wires used to secure the tracks.  We then utilized a stereotactic screwdriver to place pedicle screws from L3 to L5.  At each level, Nuvasive Reline pedicle screws were placed.  Once the screws were placed, the screw extensions were  then linked, a path was formed for the rod and a rod was utilized to connect the screws.  We then compressed, torqued / counter-torqued and removed the screw assembly. Once performed on each side, the C-arm was brought back and to take confirmatory CT scan showing appropriate placement of all instrumentation and anatomic alignment.    Posterolateral arthrodesis was performed at L3-L4 and L4-5 utilizing demineralized bone matrix.  We irrigated each incision and obtained hemostasis. Each wound was closed using 0 Vicryl interrupted suture in the fascia, 2-0 Vicryl inverted suture were placed in the subcutaneous tissue and dermis. 3-0 monocryl was used for final closure. Dermabond was used to close the skin.    Needle, lap and all counts were correct at the end of the case.     Edsel Goods PA assisted in the entire procedure. An assistant was required for this procedure due to the complexity.  The assistant provided assistance in tissue manipulation and suction, and was required for the successful and safe performance of the procedure. I performed the critical portions of the procedure.   Reeves Daisy MD Neurosurgery

## 2024-07-30 NOTE — H&P (Signed)
 "   Referring Physician:  No referring provider defined for this encounter.  Primary Physician:  Oris Camie BRAVO, NP  History of Present Illness: 07/30/2024 Caitlin Rogers presents today for surgical intervention.   05/27/2024 Caitlin Rogers is here today with a chief complaint of back and leg pain.  She experiences significant pain in her buttocks radiating down her left leg, which limits her ability to walk long distances. She often needs to sit or lean on a car for support when shopping. She uses a rollator for mobility but sometimes forgets to use it. Physical therapy and medications, including Advil, have not provided sufficient relief.  She has undergone neck surgery in [place], [place] , and experiences numbness on both sides of her body. An MRI of her neck was conducted to assess for adjacent segment disease, but it was not found to be substantive.   Discussed the use of AI scribe software for clinical note transcription with the patient, who gave verbal consent to proceed.  The symptoms are causing a significant impact on the patient's life.   I have utilized the care everywhere function in epic to review the outside records available from external health systems.  Progress Note form [facility], [place] on 05/12/24:   History of Present Illness: 05/12/2024 Caitlin Rogers has a history of HTN, CAD, GERD, hyperlipidemia, anxiety, prediabetes, GAD.    Last seen by me on 04/24/24 for constant LBP with no leg pain. She has intermittent numbness in left leg with constant weakness. Pain started after left THA in April of 2025.    She has known slip L3-L4 with moderate/severe right foraminal stenosis and severe central stenosis. Slip L4-L5 with severe central stenosis and severe left foraminal stenosis. Mild left foraminal stenosis L5-S1. May have component of epidural lipomatosis as well.    She was having balance issues at last visit. She has history of cervical fusion. She was hyper  reflexic with positive hoffmans on right.    She is here to review her lumbar xrays and cervical MRI. We were working on getting her PT notes as well.    She is about the same. She continues with constant LBP. No leg pain, but she has intermittent numbness in left leg and constant weakness in entire left leg. She has fallen due to weakness. Pain is worse with walking and better with sitting.    She has intermittent numbness in right > left hand. She has intermittent neck pain that is more tolerable. No arm pain.    She did PT for her back at [facility] for 6 weeks.    She is taking diclofenac, robaxin /zanaflex (takes rarely and not at the same time).   Injections not recommended by PMR at [facility]) due to history of AVN (left shoulder and both hips) along with epidural lipomatosis. She was being referred to Dr. Burnetta for surgery.    Tobacco use: Does not smoke.    Bowel/Bladder Dysfunction: none   Conservative measures:  Physical therapy:  has participated with [facility]- last visit was 03/18/24, was initial evaluation 02/08/24 Multimodal medical therapy including regular antiinflammatories: Tizanidine, Methocarbamol , Diclofenac. Injections: no epidural steroid injections   Past Surgery:  Cervical fusion in [place]  around 2015   Review of Systems:  A 10 point review of systems is negative, except for the pertinent positives and negatives detailed in the HPI.  Past Medical History: Past Medical History:  Diagnosis Date   Acute non-recurrent pansinusitis 08/27/2023   Allergy 2015  Itchy   Anemia 02/26/2023   Anxiety    Aortic atherosclerosis 09/29/2020   On coronary CT    Arthritis    Atypical chest pain 11/11/2016   Cancer (HCC) 03/2022   Basal carcinoma mohs 04/18/22   Cardiac murmur 01/19/2020   Cataract 2023   Removed both eyes   Cervical radiculopathy 02/13/2020   Chest discomfort 01/19/2020   Coronary artery calcification 10/07/2020   COVID-19 06/2019    Current moderate episode of major depressive disorder without prior episode (HCC) 07/06/2020   Dermatitis 11/01/2020   Encounter for pre-operative cardiovascular clearance 07/18/2022   Essential hypertension 11/11/2016   GERD (gastroesophageal reflux disease) 11/11/2016   Hyperlipemia    Hyperlipidemia 11/11/2016   Hypertension    Numbness 01/02/2020   Other spondylosis with myelopathy, cervical region 01/19/2020   Palpitations 11/11/2016   Pneumonia    Post-concussion syndrome 02/26/2023   Pre-diabetes    Ringing in the ears    post covid symptom   Rotator cuff tear 10/28/2021    Past Surgical History: Past Surgical History:  Procedure Laterality Date   APPENDECTOMY     BICEPT TENODESIS Left 10/28/2021   Procedure: BICEPS TENODESIS;  Surgeon: Tobie Priest, MD;  Location: [facility];  Service: Orthopedics;  Laterality: Left;   Bilateral Hip Replacement      2023 - 2024   BUNIONECTOMY     bilat   CERVICAL SPINE SURGERY     CESAREAN SECTION     x 2   COLONOSCOPY  03/10/2016   Dr.Stark   EYE SURGERY Bilateral 2023   cataract both eyes with lense replacement   JOINT REPLACEMENT  2023   Left shoulder   MOUTH SURGERY  2019   implant with crown   POLYPECTOMY     REVERSE SHOULDER ARTHROPLASTY Left 10/28/2021   Procedure: Left reverse shoulder arthroplasty, biceps tenodesis;  Surgeon: Tobie Priest, MD;  Location: [facility];  Service: Orthopedics;  Laterality: Left;   SHOULDER ARTHROSCOPY WITH ROTATOR CUFF REPAIR AND SUBACROMIAL DECOMPRESSION Left 07/01/2021   Procedure: Left shoulder arthroscopic subscapularis repair, mini-open supraspinatus repair, subacromial decompression, and bicepst tenodesis;  Surgeon: Tobie Priest, MD;  Location: [facility];  Service: Orthopedics;  Laterality: Left;   SPINE SURGERY  2015   TONSILLECTOMY     TUBAL LIGATION     UPPER GASTROINTESTINAL ENDOSCOPY     WISDOM TOOTH EXTRACTION      Allergies: Allergies as of 05/28/2024 - Review  Complete 05/12/2024  Allergen Reaction Noted   Codeine Itching 04/05/2020   Tramadol  Itching 05/23/2021    Medications:  Current Facility-Administered Medications:    ceFAZolin  (ANCEF ) IVPB 2g/100 mL premix, 2 g, Intravenous, Once, Clois Fret, MD   lactated ringers  infusion, , Intravenous, Continuous, Chesley Lendia CROME, MD, Last Rate: 10 mL/hr at 07/30/24 1245, Continued from Pre-op at 07/30/24 1245   vancomycin  (VANCOCIN ) IVPB 1000 mg/200 mL premix, 1,000 mg, Intravenous, Once, Clois Fret, MD, Last Rate: 200 mL/hr at 07/30/24 1239, 1,000 mg at 07/30/24 1239  Social History: Social History   Tobacco Use   Smoking status: Former    Current packs/day: 0.00    Average packs/day: 0.5 packs/day for 15.0 years (7.5 ttl pk-yrs)    Types: Cigarettes    Start date: 09/30/1997    Quit date: 09/30/2012    Years since quitting: 11.8   Smokeless tobacco: Never  Vaping Use   Vaping status: Never Used  Substance Use Topics   Alcohol use: Yes    Alcohol/week: 6.0  standard drinks of alcohol    Types: 3 Glasses of wine, 3 Standard drinks or equivalent per week   Drug use: Not Currently    Types: Marijuana    Family Medical History: Family History  Problem Relation Age of Onset   Hypertension Mother    Diabetes Mellitus II Mother    Alcohol abuse Mother    Anxiety disorder Mother    Arthritis Mother    Diabetes Mother    Early death Mother    Heart disease Mother    Hyperlipidemia Mother    Obesity Mother    Hypertension Father    Stroke Father    Heart disease Father    Alcohol abuse Father    Diabetes Father    Hyperlipidemia Father    Stroke Brother    Heart disease Brother    Alcohol abuse Brother    Hyperlipidemia Brother    Hypertension Brother    CAD Brother    Heart disease Brother    Hyperlipidemia Brother    Colon cancer Paternal Aunt        30's   Heart attack Paternal Grandfather    Colon polyps Neg Hx    Esophageal cancer Neg Hx    Rectal  cancer Neg Hx    Stomach cancer Neg Hx     Physical Examination: Vitals:   07/30/24 1207  BP: (!) 148/72  Pulse: 84  Resp: 15  SpO2: 100%   Heart sounds normal no MRG. Chest Clear to Auscultation Bilaterally.  General: Patient is in no apparent distress. Attention to examination is appropriate.  Neck:   Supple.  Full range of motion.  Respiratory: Patient is breathing without any difficulty.   NEUROLOGICAL:     Awake, alert, oriented to person, place, and time.  Speech is clear and fluent.   Cranial Nerves: Pupils equal round and reactive to light.  Facial tone is symmetric.  Facial sensation is symmetric. Shoulder shrug is symmetric. Tongue protrusion is midline.  There is no pronator drift.  Strength: Side Biceps Triceps Deltoid Interossei Grip Wrist Ext. Wrist Flex.  R 5 5 5 5 5 5 5   L 5 5 5 5 5 5 5    Side Iliopsoas Quads Hamstring PF DF EHL  R 5 5 5 5 5 5   L 5 5 5 5 5 5    Reflexes are 3+ and symmetric at the biceps, triceps, brachioradialis, patella and achilles.   Hoffman's is absent.   Bilateral upper and lower extremity sensation is intact to light touch.    No evidence of dysmetria noted.  Gait is untested.     Medical Decision Making  Imaging: MR C spine 04/30/2024 IMPRESSION: 1.  Prior ACDF at C4-C7 with solid arthrodesis   2. The cord is mildly atrophic at the C5-C6 level likely due to previous cord compression.   3.  There is no significant spinal stenosis.   4. There is severe left facet arthropathy at C2-3, severe bilateral facet arthropathy at C3-4 and severe left facet arthropathy at C7-T1   5. There is a severe right neural foraminal stenosis at C3-4 due to a prominent foraminal spur.   There is no significant change compared with January 02, 2020     Electronically Signed   By: Nancyann Burns M.D.   On: 04/30/2024 10:22  MRI L spine 04/04/2024 Grade 1 spondylolisthesis at L3-4 and L4-5 with severe facet arthropathy and ligamentum flavum  thickening and thickening of the posterior epidural fat resulting  in severe central stenosis at both levels.  This is most severe at L4-5.  Moderate to severe right foraminal stenosis at L3-4.  Severe left foraminal stenosis at L4-5.  Severe facet arthropathy on the left at L5-S1 with mild left foraminal stenosis. I have personally reviewed the images and agree with the above interpretation.  Assessment and Plan: Caitlin Rogers is a pleasant 69 y.o. female with chronic back pain with bilateral sciatica.  She has spondylolisthesis of L3-L4 and L4-5.  She has tried and failed conservative management.  We will proceed with L3-5 lateral lumbar interbody fusion with percutaneous fixation.    Sherea Liptak K. Clois MD, [facility] Neurosurgery  "

## 2024-07-31 ENCOUNTER — Other Ambulatory Visit: Payer: Self-pay

## 2024-07-31 DIAGNOSIS — M4316 Spondylolisthesis, lumbar region: Secondary | ICD-10-CM | POA: Diagnosis not present

## 2024-07-31 MED ORDER — EZETIMIBE 10 MG PO TABS
ORAL_TABLET | ORAL | Status: AC
  Filled 2024-07-31: qty 1

## 2024-07-31 MED ORDER — DEXAMETHASONE 6 MG PO TABS
6.0000 mg | ORAL_TABLET | Freq: Once | ORAL | Status: AC
  Administered 2024-07-31: 6 mg via ORAL
  Filled 2024-07-31: qty 1

## 2024-07-31 MED ORDER — METHOCARBAMOL 500 MG PO TABS
500.0000 mg | ORAL_TABLET | Freq: Four times a day (QID) | ORAL | 0 refills | Status: AC | PRN
  Filled 2024-07-31: qty 120, 30d supply, fill #0

## 2024-07-31 MED ORDER — OXYCODONE HCL 5 MG PO TABS
5.0000 mg | ORAL_TABLET | ORAL | 0 refills | Status: DC | PRN
  Filled 2024-07-31: qty 45, 4d supply, fill #0

## 2024-07-31 MED ORDER — SENNA 8.6 MG PO TABS
1.0000 | ORAL_TABLET | Freq: Two times a day (BID) | ORAL | 0 refills | Status: DC | PRN
  Filled 2024-07-31: qty 30, 15d supply, fill #0

## 2024-07-31 MED ORDER — POLYETHYLENE GLYCOL 3350 17 GM/SCOOP PO POWD
17.0000 g | Freq: Every day | ORAL | 0 refills | Status: DC | PRN
  Filled 2024-07-31: qty 238, 14d supply, fill #0

## 2024-07-31 MED ORDER — CELECOXIB 200 MG PO CAPS
200.0000 mg | ORAL_CAPSULE | Freq: Two times a day (BID) | ORAL | 0 refills | Status: AC
  Filled 2024-07-31: qty 60, 30d supply, fill #0

## 2024-07-31 NOTE — Plan of Care (Signed)
  Problem: Clinical Measurements: Goal: Postoperative complications will be avoided or minimized Outcome: Progressing   

## 2024-07-31 NOTE — Care Management Obs Status (Signed)
 MEDICARE OBSERVATION STATUS NOTIFICATION   Patient Details  Name: Shalita Notte MRN: [id number] Date of Birth: Jan 20, 1956   Medicare Observation Status Notification Given:  Chaney BRANDY CHRISTIANE LELON, CMA 07/31/2024, 1:45 PM

## 2024-07-31 NOTE — Progress Notes (Signed)
 Discharge Summary for Caitlin Rogers  Discharge Plan: Patient will be discharged home as per the MD's order. We discussed prescriptions and follow-up appointments with the patient. The prescriptions were provided, and the medication list was explained in detail. Patient confirmed understanding of the instructions.  Skin Assessment: The patient's skin is clean, dry, and intact, with no signs of breakdown or tears. The IV catheter was removed, and the skin remains intact. The site shows no signs or symptoms of complications. A dressing and pressure were applied to the site. Patient reports no pain and has no complaints.  After-Visit Summary: An After-Visit Summary was printed and given to the patient. The following items were sent home with patient:  Discharge medications Two TED hose placed on both patient legs  The patient was escorted via wheelchair and discharged home in a private vehicle.  Araina Butrick D. Geofm, RN

## 2024-07-31 NOTE — Progress Notes (Signed)
" ° °  Neurosurgery Progress Note  History: Meiya Bessette is s/p L3-5 XLIF and PSF  POD1: Patient was sitting in the chair eating breakfast. She states that her radiating leg pain has significantly improved. Her only concern is flank and groin pain. Foley was removed and patient had urine output.  Physical Exam: Vitals:   07/31/24 0454 07/31/24 0739  BP: (!) 133/54 136/66  Pulse: 69 70  Resp: 16 15  Temp: 97.7 F (36.5 C) 98.2 F (36.8 C)  SpO2: 93% 100%    AA Ox3 CNI  Strength: Side Biceps Triceps Deltoid Interossei Grip Wrist Ext. Wrist Flex.  R 5 5 5 5 5 5 5   L 5 5 5 5 5 5 5     Side Iliopsoas Quads Hamstring PF DF EHL  R 5 5 5 5 5 5   L 4- 3 (pain limited) 5 5 5 5    Incision: all incisions clean and dry with dressings in place   Data:  Other tests/results: N/A  Assessment/Plan:  Martia Rosado is a 69 year old female that presented with radiating left leg pain and back pain due to spondylolisthesis of L3-5. Patient is s/p L3-5 XLIF and PSF.  - mobilize - pain control; will give one time dose of dexamethasone  - DVT prophylaxis - PTOT; will reevaluate at lunchtime. Dispo planning underway  Edsel Goods PA-C (seen in conjunction with Kiera Pugalee, PA-S) Department of Neurosurgery    "

## 2024-07-31 NOTE — Anesthesia Postprocedure Evaluation (Signed)
"   Anesthesia Post Note  Patient: Caitlin Rogers  Procedure(s) Performed: ANTERIOR LATERAL LUMBAR FUSION WITH PERCUTANEOUS SCREW 2 LEVEL (Left) APPLICATION OF INTRAOPERATIVE CT SCAN (Left)  Patient location during evaluation: PACU Anesthesia Type: General Level of consciousness: awake and alert Pain management: pain level controlled Vital Signs Assessment: post-procedure vital signs reviewed and stable Respiratory status: spontaneous breathing, nonlabored ventilation, respiratory function stable and patient connected to nasal cannula oxygen Cardiovascular status: blood pressure returned to baseline and stable Postop Assessment: no apparent nausea or vomiting Anesthetic complications: no   No notable events documented.   Last Vitals:  Vitals:   07/31/24 0000 07/31/24 0454  BP: 132/65 (!) 133/54  Pulse: 72 69  Resp: 16 16  Temp: 36.6 C 36.5 C  SpO2: 96% 93%    Last Pain:  Vitals:   07/31/24 0454  TempSrc: Temporal  PainSc:                  Lynwood KANDICE Clause      "

## 2024-07-31 NOTE — Discharge Summary (Signed)
 Discharge Summary  Patient ID: Caitlin Rogers MRN: [id number] DOB/AGE: [birth year] 69 y.o.  Admit date: 07/30/2024 Discharge date: 07/31/2024  Admission Diagnoses:  M43.16 Spondylolisthesis of lumbar region, M54.42, M54.41, G89.29 Chronic bilateral low back pain with bilateral sciatica  Discharge Diagnoses:  Principal Problem:   S/P lumbar fusion Active Problems:   Spondylolisthesis of lumbar region   Chronic bilateral low back pain with bilateral sciatica   Discharged Condition: good  Hospital Course:  Caitlin Rogers is a 69 y.o presenting with bilateral sciatica s/p L3-5 XLIF and PSF. Her intraoperative course was uncomplicated. She was admitted for pain control and therapy evaluation. She was seen by therapy and deemed appropriate for discharge home with [facility] on POD1. She experienced some left groin pain and pain limited weakness on POD1 which was treated with one dose of IV steroids. This improved her pain and she discharged home on the afternoon of POD1 with medications for pain, muscle relaxer, and stool softener  Consults: None  Significant Diagnostic Studies: NA  Treatments: surgery: as above. Please see separately dictated operative report for further details   Discharge Exam: Blood pressure 136/66, pulse 70, temperature 98.2 F (36.8 C), temperature source Oral, resp. rate 15, height 5' 1 (1.549 m), weight 77.1 kg, SpO2 100%.  AA Ox3 CNI   Strength: Side Biceps Triceps Deltoid Interossei Grip Wrist Ext. Wrist Flex.  R 5 5 5 5 5 5 5   L 5 5 5 5 5 5 5     Side Iliopsoas Quads Hamstring PF DF EHL  R 5 5 5 5 5 5   L 4- 3 (pain limited) 5 5 5 5     Incision: all incisions clean and dry with dressings in place   Disposition: Discharge disposition: 06-Home-Health Care Svc       Discharge Instructions     Incentive spirometry RT   Complete by: As directed    Remove dressing in 48 hours   Complete by: As directed       Allergies as of 07/31/2024       Reactions    Codeine Itching   Tramadol  Itching        Medication List     PAUSE taking these medications    aspirin  EC 81 MG tablet Wait to take this until your doctor or other care provider tells you to start again. Take 81 mg by mouth daily. Swallow whole.       STOP taking these medications    amoxicillin  500 MG capsule Commonly known as: AMOXIL    diclofenac 75 MG EC tablet Commonly known as: VOLTAREN   diclofenac Sodium 1 % Gel Commonly known as: VOLTAREN   Flonase  Sensimist 27.5 MCG/SPRAY nasal spray Generic drug: fluticasone   ipratropium 0.06 % nasal spray Commonly known as: ATROVENT        TAKE these medications    acetaminophen  500 MG tablet Commonly known as: TYLENOL  Take 500 mg by mouth every 6 (six) hours as needed for mild pain (pain score 1-3) or moderate pain (pain score 4-6).   ALPRAZolam  0.5 MG tablet Commonly known as: XANAX  TAKE ONE TABLET BY MOUTH NO MORE THAN TWICE A DAY FOR SEVERE ANXIETY What changed: additional instructions   CALCIUM  + VITAMIN D3 PO Take 1 tablet by mouth in the morning. 1200mg  calcium  & 1000mg  d3   celecoxib  200 MG capsule Commonly known as: CeleBREX  Take 1 capsule (200 mg total) by mouth 2 (two) times daily.   ezetimibe  10 MG tablet Commonly known as: ZETIA  Take  1 tablet (10 mg total) by mouth daily.   hydrochlorothiazide  25 MG tablet Commonly known as: HYDRODIURIL  Take 1 tablet (25 mg total) by mouth daily.   methocarbamol  500 MG tablet Commonly known as: ROBAXIN  Take 1 tablet (500 mg total) by mouth every 6 (six) hours as needed for muscle spasms. What changed:  when to take this reasons to take this additional instructions   metoprolol  succinate 25 MG 24 hr tablet Commonly known as: TOPROL -XL Take 1 tablet (25 mg total) by mouth daily.   MILK THISTLE PO Take 300 mg by mouth daily.   mineral oil-hydrophilic petrolatum ointment Apply 1 Application topically daily as needed for dry skin.   olmesartan  5 MG  tablet Commonly known as: BENICAR  TAKE ONE TABLET BY MOUTH ONE TIME DAILY   omeprazole  40 MG capsule Commonly known as: PRILOSEC Take 1 capsule (40 mg total) by mouth daily.   oxyCODONE  5 MG immediate release tablet Commonly known as: Oxy IR/ROXICODONE  Take 1-2 tablets (5-10 mg total) by mouth every 4 (four) hours as needed for up to 5 days for moderate pain (pain score 4-6) or severe pain (pain score 7-10).   polyethylene glycol 17 g packet Commonly known as: MIRALAX  / GLYCOLAX  Take 17 g by mouth daily as needed for moderate constipation.   potassium chloride  10 MEQ tablet Commonly known as: KLOR-CON  M Take 1 tablet (10 mEq total) by mouth daily.   rosuvastatin  20 MG tablet Commonly known as: CRESTOR  Take 1 tablet (20 mg total) by mouth daily.   senna 8.6 MG Tabs tablet Commonly known as: SENOKOT Take 1 tablet (8.6 mg total) by mouth 2 (two) times daily as needed for mild constipation.   sulfamethoxazole -trimethoprim  800-160 MG tablet Commonly known as: Bactrim  DS Take 1 tablet by mouth 2 (two) times daily for 3 days.         Signed: Edsel Jama Goods 07/31/2024, 11:01 AM

## 2024-07-31 NOTE — Evaluation (Signed)
 Occupational Therapy Evaluation Patient Details Name: Caitlin Rogers MRN: [id number] DOB: November 03, 1955 Today's Date: 07/31/2024   History of Present Illness   [name] is a 69 y.o. female with M43.16 Spondylolisthesis of lumbar region, M54.42, M54.41, G89.29 Chronic bilateral low back pain with bilateral sciatica     Clinical Impressions Upon entering the room, pt supine in bed and agreeable to OT evaluation. OT reviewed precautions and pt performs log roll with supervision and no physical assistance. Pt pt able to demonstrate circle sitting on EOB to don underwear while maintaining precautions and stands with min guard for balance to pull over B hips. Pt declines need for toileting. Pt ambulating 200' with min guard progressing to supervision with use of RW. Pt returning to room. OT discussed other home situations and how to navigate safely. Pt returning to supine. Call bell and all needed items within reach upon exiting the room.      If plan is discharge home, recommend the following:   A little help with walking and/or transfers;A little help with bathing/dressing/bathroom;Assist for transportation;Help with stairs or ramp for entrance     Functional Status Assessment   Patient has had a recent decline in their functional status and demonstrates the ability to make significant improvements in function in a reasonable and predictable amount of time.     Equipment Recommendations   None recommended by OT       Mobility Bed Mobility Overal bed mobility: Needs Assistance Bed Mobility: Supine to Sit, Sit to Supine     Supine to sit: Supervision Sit to supine: Supervision   General bed mobility comments: log roll    Transfers Overall transfer level: Needs assistance Equipment used: Rolling walker (2 wheels) Transfers: Sit to/from Stand Sit to Stand: Contact guard assist, Supervision                  Balance Overall balance assessment: Needs  assistance Sitting-balance support: Feet supported Sitting balance-Leahy Scale: Good     Standing balance support: During functional activity Standing balance-Leahy Scale: Fair                             ADL either performed or assessed with clinical judgement   ADL Overall ADL's : Needs assistance/impaired                     Lower Body Dressing: Contact guard assist   Toilet Transfer: Supervision/safety                   Vision Patient Visual Report: No change from baseline              Pertinent Vitals/Pain Pain Assessment Pain Assessment: 0-10 Pain Score: 3  Pain Location: back Pain Descriptors / Indicators: Discomfort Pain Intervention(s): Monitored during session, Premedicated before session     Extremity/Trunk Assessment Upper Extremity Assessment Upper Extremity Assessment: Overall WFL for tasks assessed   Lower Extremity Assessment Lower Extremity Assessment: Generalized weakness       Communication Communication Communication: No apparent difficulties   Cognition Arousal: Alert Behavior During Therapy: WFL for tasks assessed/performed Cognition: No apparent impairments                               Following commands: Intact       Cueing  General Comments   Cueing Techniques: Verbal cues  Home Living Family/patient expects to be discharged to:: Private residence Living Arrangements: Spouse/significant other Available Help at Discharge: Family;Available 24 hours/day Type of Home: House Home Access: Stairs to enter [employer] of Steps: 1 Entrance Stairs-Rails: None Home Layout: Two level;Able to live on main level with bedroom/bathroom     Bathroom Shower/Tub: Producer, Television/film/video: Standard     Home Equipment: Agricultural Consultant (2 wheels);Cane - single point;Shower seat          Prior Functioning/Environment Prior Level of Function :  Independent/Modified Independent                    OT Problem List: Decreased strength;Impaired balance (sitting and/or standing);Decreased safety awareness;Decreased activity tolerance;Decreased knowledge of precautions   OT Treatment/Interventions: Self-care/ADL training;Therapeutic exercise;Patient/family education;Balance training;Energy conservation;Therapeutic activities;DME and/or AE instruction      OT Goals(Current goals can be found in the care plan section)   Acute Rehab OT Goals Patient Stated Goal: to go home OT Goal Formulation: With patient Time For Goal Achievement: 08/14/24 Potential to Achieve Goals: Fair ADL Goals Pt Will Perform Grooming: with modified independence;standing Pt Will Perform Lower Body Dressing: with modified independence;sit to/from stand Pt Will Transfer to Toilet: with modified independence;ambulating Pt Will Perform Toileting - Clothing Manipulation and hygiene: with modified independence;sit to/from stand   OT Frequency:  Min 2X/week       AM-PAC OT 6 Clicks Daily Activity     Outcome Measure Help from another person eating meals?: None Help from another person taking care of personal grooming?: None Help from another person toileting, which includes using toliet, bedpan, or urinal?: A Little Help from another person bathing (including washing, rinsing, drying)?: A Little Help from another person to put on and taking off regular upper body clothing?: None Help from another person to put on and taking off regular lower body clothing?: A Little 6 Click Score: 21   End of Session Equipment Utilized During Treatment: Rolling walker (2 wheels) Nurse Communication: Mobility status  Activity Tolerance: Patient tolerated treatment well Patient left: in bed;with call bell/phone within reach  OT Visit Diagnosis: Unsteadiness on feet (R26.81);Muscle weakness (generalized) (M62.81)                Time: 8978-8948 OT Time Calculation  (min): 30 min Charges:  OT General Charges $OT Visit: 1 Visit OT Evaluation $OT Eval Moderate Complexity: 1 Mod OT Treatments $Self Care/Home Management : 8-22 mins  [name], MS, OTR/L , CBIS ascom [phone]  07/31/2024, 2:15 PM

## 2024-07-31 NOTE — Evaluation (Signed)
 Physical Therapy Evaluation Patient Details Name: Caitlin Rogers MRN: [id number] DOB: [birth year] Today's Date: 07/31/2024  History of Present Illness  [name] is a 69 y.o. female with M43.16 Spondylolisthesis of lumbar region, M54.42, M54.41, G89.29 Chronic bilateral low back pain with bilateral sciatica  Clinical Impression  Patient noted to be in seated position at PT arrival in room, for an initial PT evaluation due to a decline in functional status, with baseline mobility reported as independent, and currently requiring CGA for room and hallway ambulation distance of 60' feet. The patient is A&O x 4, presenting with good willingness to work with PT. The patient resides in a house and lives with husband with family/friend support. There are noSTE inside the residence.  Vitals are stable. Gait was assessed with RW. Gait mechanic observations noted expected PO lumbar fusion. The overall clinical impression is that the patient presents with mild mobility limitations. Recommended skilled PT will address safety, mobility, and discharge planning.           If plan is discharge home, recommend the following: A little help with walking and/or transfers;A little help with bathing/dressing/bathroom;Help with stairs or ramp for entrance;Assist for transportation   Can travel by private vehicle        Equipment Recommendations None recommended by PT  Recommendations for Other Services       Functional Status Assessment Patient has had a recent decline in their functional status and demonstrates the ability to make significant improvements in function in a reasonable and predictable amount of time.     Precautions / Restrictions Precautions Precautions: Back Precaution Booklet Issued: Yes (comment) Restrictions Weight Bearing Restrictions Per Provider Order: No      Mobility  Bed Mobility                    Transfers Overall transfer level: Needs assistance Equipment  used: Rolling walker (2 wheels) Transfers: Sit to/from Stand Sit to Stand: Contact guard assist           General transfer comment: vc for movement sequence and safety precautions    Ambulation/Gait Ambulation/Gait assistance: Contact guard assist Gait Distance (Feet): 60 Feet Assistive device: Rolling walker (2 wheels) Gait Pattern/deviations: Step-through pattern, Shuffle Gait velocity: decreased     General Gait Details: vc for RW use  Stairs            Wheelchair Mobility     Tilt Bed    Modified Rankin (Stroke Patients Only)       Balance Overall balance assessment: Needs assistance Sitting-balance support: Feet supported Sitting balance-Leahy Scale: Good     Standing balance support: During functional activity Standing balance-Leahy Scale: Fair                               Pertinent Vitals/Pain Pain Assessment Pain Assessment: Faces Faces Pain Scale: Hurts even more Pain Location: lumbar back Pain Descriptors / Indicators: Shooting, Aching Pain Intervention(s): Monitored during session, Limited activity within patient's tolerance    Home Living Family/patient expects to be discharged to:: Private residence Living Arrangements: Spouse/significant other Available Help at Discharge: Family;Available 24 hours/day Type of Home: House Home Access: Stairs to enter Entrance Stairs-Rails: None Entrance Stairs-Number of Steps: 1   Home Layout: Two level;Able to live on main level with bedroom/bathroom Home Equipment: Rolling Walker (2 wheels);Cane - single point;Shower Rogers      Prior Function Prior Level of Function :  Independent/Modified Independent                     Extremity/Trunk Assessment   Upper Extremity Assessment Upper Extremity Assessment: Generalized weakness    Lower Extremity Assessment Lower Extremity Assessment: Generalized weakness    Cervical / Trunk Assessment Cervical / Trunk Assessment:  Kyphotic  Communication   Communication Communication: No apparent difficulties    Cognition Arousal: Alert Behavior During Therapy: WFL for tasks assessed/performed   PT - Cognitive impairments: No apparent impairments                         Following commands: Intact       Cueing Cueing Techniques: Verbal cues     General Comments      Exercises     Assessment/Plan    PT Assessment Patient needs continued PT services  PT Problem List Decreased strength;Decreased activity tolerance;Decreased balance       PT Treatment Interventions      PT Goals (Current goals can be found in the Care Plan section)  Acute Rehab PT Goals Patient Stated Goal: pt want to retrun home PT Goal Formulation: With patient Time For Goal Achievement: 08/21/24 Potential to Achieve Goals: Good    Frequency Min 2X/week     Co-evaluation               AM-PAC PT 6 Clicks Mobility  Outcome Measure Help needed turning from your back to your side while in a flat bed without using bedrails?: None Help needed moving from lying on your back to sitting on the side of a flat bed without using bedrails?: None Help needed moving to and from a bed to a chair (including a wheelchair)?: None Help needed standing up from a chair using your arms (e.g., wheelchair or bedside chair)?: A Little Help needed to walk in hospital room?: A Little Help needed climbing 3-5 steps with a railing? : A Little 6 Click Score: 21    End of Session   Activity Tolerance: Patient tolerated treatment well Patient left: in chair;with call bell/phone within reach;with family/visitor present Nurse Communication: Mobility status PT Visit Diagnosis: Other abnormalities of gait and mobility (R26.89);Difficulty in walking, not elsewhere classified (R26.2)    Time: 9164-9154 PT Time Calculation (min) (ACUTE ONLY): 10 min   Charges:   PT Evaluation $PT Eval Low Complexity: 1 Low   PT General Charges $$  ACUTE PT VISIT: 1 Visit         Sherlean Lesches DPT, PT    Sherlean A Clifford Coudriet 07/31/2024, 8:54 AM

## 2024-07-31 NOTE — Plan of Care (Signed)
" °  Problem: Education: Goal: Knowledge of the prescribed therapeutic regimen will improve Outcome: Progressing   Problem: Bowel/Gastric: Goal: Gastrointestinal status for postoperative course will improve Outcome: Progressing   Problem: Cardiac: Goal: Ability to maintain an adequate cardiac output Outcome: Progressing   Problem: Respiratory: Goal: Will regain and/or maintain adequate ventilation Outcome: Progressing   Problem: Skin Integrity: Goal: Demonstrates signs of wound healing without infection Outcome: Progressing   "

## 2024-07-31 NOTE — TOC Transition Note (Signed)
 Transition of Care [facility]) - Discharge Note   Patient Details  Name: Caitlin Rogers MRN: [id number] Date of Birth: 14-Jul-1956  Transition of Care [facility]) CM/SW Contact:  Victory Jackquline RAMAN, RN Phone Number: 07/31/2024, 12:33 PM   Clinical Narrative:    RNCM met with patient at bedside. RNCM introduced myself and my role and explained that discharge planning would be discussed. Referral's sent for HH/PT/OT and Offers reviewed with patient. Patient discharging home with [facility] for PT/OT. Patient states that she doesn't need any DME. She already has equipment at home. Patient's husband will be picking her up at the time of discharge. Pt has discharge orders, no further concerns. RNCM signing off.  Final next level of care: Home w Home Health Services Barriers to Discharge: Barriers Resolved   Patient Goals and CMS Choice            Discharge Placement                Patient to be transferred to facility by: Husband Name of family member notified: Luis Patient and family notified of of transfer: 07/31/24  Discharge Plan and Services Additional resources added to the After Visit Summary for                            [facility] Arranged: PT, OT HH Agency: [facility] Date [facility] Agency Contacted: 07/31/24   Representative spoke with at [facility] Agency: Referrals sent via EPIC  Social Drivers of Health (SDOH) Interventions SDOH Screenings   Food Insecurity: Patient Declined (02/26/2024)  Housing: Unknown (02/26/2024)  Transportation Needs: Patient Declined (02/26/2024)  Alcohol Screen: Low Risk (02/26/2024)  Depression (PHQ2-9): Low Risk (09/04/2023)  Financial Resource Strain: Patient Declined (02/26/2024)  Physical Activity: Unknown (02/26/2024)  Social Connections: Patient Declined (07/30/2024)  Stress: Patient Declined (02/26/2024)  Tobacco Use: Medium Risk (07/30/2024)     Readmission Risk Interventions     No data to display

## 2024-08-01 ENCOUNTER — Telehealth: Payer: Self-pay | Admitting: Neurosurgery

## 2024-08-01 NOTE — Telephone Encounter (Signed)
 Faxed LSO brace order to [facility] in [place]. Called patient to inform her that order has been faxed and to give the clinic a call to schedule. Additionally told her that if she has any challenges with the brace once obtained to reach out to [facility] clinic and they should be able to help her. Patient to call clinic to schedule appointment.

## 2024-08-01 NOTE — Telephone Encounter (Signed)
 DOS 07/30/24 L3-5 Lateral Lumbar Interbody Fusion, Posterior Spinal Fusion with Dr. Clois  Patient calling asking if she needs a brace and says that you mentioned it during rounds. I do not see anything in your note or on the AVS. Did you discuss a brace with her?

## 2024-08-01 NOTE — Telephone Encounter (Signed)
 Will do!

## 2024-08-01 NOTE — Telephone Encounter (Signed)
 Patient is calling to get clarification on whether or not she should be wearing a brace? She states that when Edsel came around to round, this was mentioned to her but she was not discharged with one. Please advise.

## 2024-08-04 NOTE — Telephone Encounter (Signed)
 Attempted to fax 3x to number provided. Called clinic; wrong number provided. Refaxed to [phone], confirmation received.

## 2024-08-04 NOTE — Telephone Encounter (Signed)
 [facility] called to let our office know that they did not receive the order for the brace. Can this be re-faxed to [phone]?

## 2024-08-04 NOTE — Telephone Encounter (Addendum)
 Patient called to inform us  that [facility] cannot get her in for an appointment until 08/25/24. Advised her to inquire about going to their [facility] or [facility] office and being put on a cancellation list.

## 2024-08-05 MED ORDER — OXYCODONE HCL 5 MG PO TABS: 5.0000 mg | ORAL_TABLET | ORAL | 0 refills | Status: DC | PRN

## 2024-08-05 NOTE — Telephone Encounter (Signed)
 Called patient to follow up regarding brace appointment. Patient states she was able to get an appointment today at 2pm.

## 2024-08-05 NOTE — Addendum Note (Signed)
 Addended byBETHA HILMA HASTINGS on: 08/05/2024 11:32 AM   Modules accepted: Orders

## 2024-08-05 NOTE — Telephone Encounter (Signed)
 Called patient and left voicemail regarding refill and follow up on concern for pneumonia per Stacy's instructions.

## 2024-08-05 NOTE — Telephone Encounter (Signed)
 DOS: 07/30/24 L3-5 Lateral Lumbar Interbody Fusion, Posterior Spinal Fusion with Dr. Clois   If she is concerned about possible aspiration, she should follow up with PCP.   If she develops any SOB or fever, she should go to ED.     Refill of oxycodone  sent to pharmacy. Will keep directions at 1-2 q 4 hours, but she should take least amount possible. PMP reviewed and is appropriate.

## 2024-08-05 NOTE — Telephone Encounter (Signed)
 DOS: 07/30/24 L3-5 Lateral Lumbar Interbody Fusion, Posterior Spinal Fusion with Dr. Clois  Patient relays concern for potential complications from aspirating one of the first days after surgery. She states she may have been laying too flat and aspirated. Since then, she feels like it has settled in her chest and complains of tightness in her chest and coughing up green phlegm. She denies fever or difficulty breathing and states that she has been checking her pulse ox, which has remained >90%. She has not been around anyone who has been sick and has been mostly staying home. When she had cervical fusion this also happened and got pneumonia.   Additionally, patient is requesting a refill on oxycodone . She has 10 pills left.   Current Med Regimen: Tylenol  500mg  Q3H Robaxin  500mg  Q6H Oxycodone  5mg  Q4H

## 2024-08-06 ENCOUNTER — Telehealth: Payer: Self-pay | Admitting: Neurosurgery

## 2024-08-06 NOTE — Telephone Encounter (Signed)
 Patient is calling to let our office know that she is having a lot of itching around her incision site. She would like to know if she could put some Eucerin cream around it. Please advise.

## 2024-08-08 NOTE — Progress Notes (Unsigned)
" ° °  REFERRING PHYSICIAN:  Early, Sara E, Np [address] [place],  [place] [address]  DOS: 07/30/24  XLIF/PSF L3-L5  HISTORY OF PRESENT ILLNESS: Caitlin Rogers is approximately 2 weeks status post above surgery. Was given celebrex , robaxin , oxycodone  on discharge from the hospital.   She was given batrim DS on d/c?***  Preop she had pain in buttocks and left leg pain with walking***   PHYSICAL EXAMINATION:  General: Patient is well developed, well nourished, calm, collected, and in no apparent distress.   NEUROLOGICAL:  General: In no acute distress.   Awake, alert, oriented to person, place, and time.  Pupils equal round and reactive to light.  Facial tone is symmetric.     Strength:            Side Iliopsoas Quads Hamstring PF DF EHL  R 5 5 5 5 5 5   L 4-*** 3 (due to pain) 5 5 5 5    Incisions c/d/i   ROS (Neurologic):  Negative except as noted above  IMAGING: Nothing new to review.   ASSESSMENT/PLAN:  Caitlin Rogers is doing well s/p above surgery. Treatment options reviewed with patient and following plan made:   - I have advised the patient to lift up to 10 pounds until 6 weeks after surgery (follow up with Dr. Clois).  - Reviewed wound care.  - No bending, twisting, or lifting.  - Continue on current medications including ***.  - Follow up as scheduled in 4 weeks and prn.   Advised to contact the office if any questions or concerns arise.  Glade Boys PA-C Department of neurosurgery "

## 2024-08-12 ENCOUNTER — Encounter: Payer: Self-pay | Admitting: Neurosurgery

## 2024-08-13 ENCOUNTER — Ambulatory Visit: Admitting: Orthopedic Surgery

## 2024-08-13 ENCOUNTER — Encounter: Payer: Self-pay | Admitting: Orthopedic Surgery

## 2024-08-13 VITALS — BP 110/64 | Temp 98.1°F | Ht 61.0 in | Wt 170.0 lb

## 2024-08-13 DIAGNOSIS — Z981 Arthrodesis status: Secondary | ICD-10-CM

## 2024-08-13 DIAGNOSIS — M4316 Spondylolisthesis, lumbar region: Secondary | ICD-10-CM

## 2024-08-19 ENCOUNTER — Telehealth: Payer: Self-pay | Admitting: Orthopedic Surgery

## 2024-08-19 MED ORDER — OXYCODONE HCL 5 MG PO TABS: 5.0000 mg | ORAL_TABLET | Freq: Three times a day (TID) | ORAL | 0 refills | Status: AC | PRN

## 2024-08-19 NOTE — Telephone Encounter (Signed)
 Patient advised

## 2024-08-19 NOTE — Telephone Encounter (Signed)
 L3-5 Lateral Lumbar Interbody Fusion and Posterior Spinal Fusion on 07/30/24 Next appt 09/09/2024 2:30pm Dr.Yarbrough She states that she does not take it every day, just as needed. When she feels she does need it usually she will take 2-3 tabs aday. Occasionally has pain in her left groin area that will travel to her glute muscle.

## 2024-08-19 NOTE — Telephone Encounter (Signed)
 DOS: 07/30/24  XLIF/PSF L3-L5   Refill request from pharmacy for oxycodone .   When I saw her last week, she was taking oxycodone  rarely.   Please call her and verify she needs a refill of oxycodone . If so, how often is she taking it?   Will send oxycodone  back denied for now.

## 2024-08-19 NOTE — Addendum Note (Signed)
 Addended byBETHA HILMA HASTINGS on: 08/19/2024 03:44 PM   Modules accepted: Orders

## 2024-08-19 NOTE — Telephone Encounter (Signed)
 DOS: 07/30/24  XLIF/PSF L3-L5   Refill of oxycodone  sent to pharmacy. Directions changed to q 8 hours prn pain.   Please let her know. PMP reviewed and is appropriate.

## 2024-08-21 DIAGNOSIS — Z4789 Encounter for other orthopedic aftercare: Secondary | ICD-10-CM | POA: Diagnosis not present

## 2024-08-21 DIAGNOSIS — M5442 Lumbago with sciatica, left side: Secondary | ICD-10-CM | POA: Diagnosis not present

## 2024-08-21 DIAGNOSIS — M4316 Spondylolisthesis, lumbar region: Secondary | ICD-10-CM | POA: Diagnosis not present

## 2024-08-21 DIAGNOSIS — I2584 Coronary atherosclerosis due to calcified coronary lesion: Secondary | ICD-10-CM | POA: Diagnosis not present

## 2024-08-21 DIAGNOSIS — I2510 Atherosclerotic heart disease of native coronary artery without angina pectoris: Secondary | ICD-10-CM | POA: Diagnosis not present

## 2024-08-21 DIAGNOSIS — M4302 Spondylolysis, cervical region: Secondary | ICD-10-CM | POA: Diagnosis not present

## 2024-08-21 DIAGNOSIS — I10 Essential (primary) hypertension: Secondary | ICD-10-CM | POA: Diagnosis not present

## 2024-08-21 DIAGNOSIS — M5412 Radiculopathy, cervical region: Secondary | ICD-10-CM | POA: Diagnosis not present

## 2024-08-21 DIAGNOSIS — M9973 Connective tissue and disc stenosis of intervertebral foramina of lumbar region: Secondary | ICD-10-CM | POA: Diagnosis not present

## 2024-08-21 DIAGNOSIS — M5441 Lumbago with sciatica, right side: Secondary | ICD-10-CM | POA: Diagnosis not present

## 2024-08-21 DIAGNOSIS — M9971 Connective tissue and disc stenosis of intervertebral foramina of cervical region: Secondary | ICD-10-CM | POA: Diagnosis not present

## 2024-08-21 DIAGNOSIS — G8929 Other chronic pain: Secondary | ICD-10-CM | POA: Diagnosis not present

## 2024-09-04 ENCOUNTER — Encounter: Payer: Medicare Other | Admitting: Nurse Practitioner

## 2024-09-09 ENCOUNTER — Other Ambulatory Visit

## 2024-09-09 ENCOUNTER — Encounter: Admitting: Neurosurgery

## 2024-09-30 ENCOUNTER — Ambulatory Visit

## 2024-10-13 ENCOUNTER — Encounter: Admitting: Orthopedic Surgery

## 2024-10-13 ENCOUNTER — Other Ambulatory Visit
